# Patient Record
Sex: Male | Born: 1935
Health system: Southern US, Community
[De-identification: ages and names within clinical notes are randomized; demographics above are authoritative.]

## PROBLEM LIST (undated history)

## (undated) DIAGNOSIS — Z972 Presence of dental prosthetic device (complete) (partial): Secondary | ICD-10-CM

## (undated) DIAGNOSIS — I10 Essential (primary) hypertension: Secondary | ICD-10-CM

## (undated) DIAGNOSIS — R972 Elevated prostate specific antigen [PSA]: Secondary | ICD-10-CM

## (undated) DIAGNOSIS — E039 Hypothyroidism, unspecified: Secondary | ICD-10-CM

## (undated) DIAGNOSIS — K219 Gastro-esophageal reflux disease without esophagitis: Secondary | ICD-10-CM

## (undated) DIAGNOSIS — N529 Male erectile dysfunction, unspecified: Secondary | ICD-10-CM

## (undated) DIAGNOSIS — M109 Gout, unspecified: Secondary | ICD-10-CM

## (undated) DIAGNOSIS — F1021 Alcohol dependence, in remission: Secondary | ICD-10-CM

## (undated) DIAGNOSIS — M1990 Unspecified osteoarthritis, unspecified site: Secondary | ICD-10-CM

## (undated) DIAGNOSIS — N183 Chronic kidney disease, stage 3 unspecified: Secondary | ICD-10-CM

## (undated) DIAGNOSIS — Z862 Personal history of diseases of the blood and blood-forming organs and certain disorders involving the immune mechanism: Secondary | ICD-10-CM

## (undated) DIAGNOSIS — H409 Unspecified glaucoma: Secondary | ICD-10-CM

## (undated) DIAGNOSIS — N400 Enlarged prostate without lower urinary tract symptoms: Secondary | ICD-10-CM

## (undated) DIAGNOSIS — Z8619 Personal history of other infectious and parasitic diseases: Secondary | ICD-10-CM

## (undated) HISTORY — DX: Benign prostatic hyperplasia without lower urinary tract symptoms: N40.0

## (undated) HISTORY — DX: Male erectile dysfunction, unspecified: N52.9

## (undated) HISTORY — DX: Hypothyroidism, unspecified: E03.9

## (undated) HISTORY — PX: TONSILLECTOMY: SUR1361

## (undated) HISTORY — DX: Personal history of other infectious and parasitic diseases: Z86.19

## (undated) HISTORY — DX: Personal history of diseases of the blood and blood-forming organs and certain disorders involving the immune mechanism: Z86.2

## (undated) HISTORY — DX: Unspecified glaucoma: H40.9

## (undated) HISTORY — DX: Alcohol dependence, in remission: F10.21

## (undated) HISTORY — DX: Elevated prostate specific antigen (PSA): R97.20

## (undated) HISTORY — PX: EYE SURGERY: SHX253

## (undated) HISTORY — DX: Gout, unspecified: M10.9

## (undated) HISTORY — DX: Essential (primary) hypertension: I10

---

## 2002-06-01 HISTORY — PX: PARATHYROIDECTOMY: SHX19

## 2003-05-15 ENCOUNTER — Other Ambulatory Visit: Payer: Self-pay

## 2005-09-21 ENCOUNTER — Ambulatory Visit: Payer: Self-pay | Admitting: Gastroenterology

## 2005-11-09 ENCOUNTER — Encounter: Payer: Self-pay | Admitting: Family Medicine

## 2005-11-29 ENCOUNTER — Encounter: Payer: Self-pay | Admitting: Family Medicine

## 2009-03-12 ENCOUNTER — Ambulatory Visit: Payer: Self-pay | Admitting: Gastroenterology

## 2009-03-12 LAB — HM COLONOSCOPY

## 2012-05-17 DIAGNOSIS — L309 Dermatitis, unspecified: Secondary | ICD-10-CM | POA: Insufficient documentation

## 2012-08-02 DIAGNOSIS — B356 Tinea cruris: Secondary | ICD-10-CM | POA: Insufficient documentation

## 2012-10-25 ENCOUNTER — Ambulatory Visit: Payer: Self-pay | Admitting: Internal Medicine

## 2013-01-17 ENCOUNTER — Other Ambulatory Visit: Payer: Self-pay | Admitting: Rheumatology

## 2013-01-17 LAB — BODY FLUID CELL COUNT WITH DIFFERENTIAL
Eosinophil: 0 %
Neutrophils: 98 %
Nucleated Cell Count: 13881 /mm3
Other Cells BF: 0 %
Other Mononuclear Cells: 1 %

## 2013-01-17 LAB — SYNOVIAL FLUID, CRYSTAL

## 2013-03-15 DIAGNOSIS — Z79899 Other long term (current) drug therapy: Secondary | ICD-10-CM | POA: Insufficient documentation

## 2013-04-18 DIAGNOSIS — L308 Other specified dermatitis: Secondary | ICD-10-CM | POA: Insufficient documentation

## 2013-11-17 LAB — LIPID PANEL
Cholesterol: 178 mg/dL (ref 0–200)
HDL: 37 mg/dL (ref 35–70)
LDL CALC: 121 mg/dL
TRIGLYCERIDES: 98 mg/dL (ref 40–160)

## 2014-05-30 LAB — BASIC METABOLIC PANEL
BUN: 19 mg/dL (ref 4–21)
Creatinine: 1.7 mg/dL — AB (ref 0.6–1.3)
Glucose: 81 mg/dL
Potassium: 4.1 mmol/L (ref 3.4–5.3)
Sodium: 139 mmol/L (ref 137–147)

## 2014-05-30 LAB — TSH: TSH: 3.3 u[IU]/mL (ref 0.41–5.90)

## 2015-01-25 ENCOUNTER — Other Ambulatory Visit: Payer: Self-pay | Admitting: *Deleted

## 2015-01-25 NOTE — Telephone Encounter (Signed)
Received rx request for BD syringes from [place]. Called pt and left vm for return call, need more information for why pt needs syringes?

## 2015-02-07 NOTE — Telephone Encounter (Signed)
Patient is needing syringes for his b12 injections. Patient wants rx to go to [place].

## 2015-02-08 NOTE — Telephone Encounter (Signed)
Patient stated that he went to pharmacy yesterday and pick-up 6 syringes to use, from pharmacist. Patient said that when he comes to his next ov, he will give [place] the size he uses.

## 2015-02-08 NOTE — Telephone Encounter (Signed)
Need to know what size needles and syringes he has been using. We don't have a records of that information. If patient doesn't know then the pharmacy should. Thanks.

## 2015-03-08 ENCOUNTER — Ambulatory Visit (INDEPENDENT_AMBULATORY_CARE_PROVIDER_SITE_OTHER): Payer: Commercial Managed Care - HMO | Admitting: Family Medicine

## 2015-03-08 ENCOUNTER — Encounter: Payer: Self-pay | Admitting: Family Medicine

## 2015-03-08 VITALS — BP 130/80 | HR 76 | Temp 98.1°F | Resp 16 | Wt 188.0 lb

## 2015-03-08 DIAGNOSIS — E785 Hyperlipidemia, unspecified: Secondary | ICD-10-CM | POA: Insufficient documentation

## 2015-03-08 DIAGNOSIS — L299 Pruritus, unspecified: Secondary | ICD-10-CM | POA: Insufficient documentation

## 2015-03-08 DIAGNOSIS — N183 Chronic kidney disease, stage 3 unspecified: Secondary | ICD-10-CM | POA: Insufficient documentation

## 2015-03-08 DIAGNOSIS — M109 Gout, unspecified: Secondary | ICD-10-CM | POA: Insufficient documentation

## 2015-03-08 DIAGNOSIS — M8580 Other specified disorders of bone density and structure, unspecified site: Secondary | ICD-10-CM | POA: Insufficient documentation

## 2015-03-08 DIAGNOSIS — N400 Enlarged prostate without lower urinary tract symptoms: Secondary | ICD-10-CM | POA: Diagnosis not present

## 2015-03-08 DIAGNOSIS — H4010X0 Unspecified open-angle glaucoma, stage unspecified: Secondary | ICD-10-CM | POA: Insufficient documentation

## 2015-03-08 DIAGNOSIS — Z23 Encounter for immunization: Secondary | ICD-10-CM

## 2015-03-08 DIAGNOSIS — H401133 Primary open-angle glaucoma, bilateral, severe stage: Secondary | ICD-10-CM | POA: Insufficient documentation

## 2015-03-08 DIAGNOSIS — I10 Essential (primary) hypertension: Secondary | ICD-10-CM

## 2015-03-08 DIAGNOSIS — D510 Vitamin B12 deficiency anemia due to intrinsic factor deficiency: Secondary | ICD-10-CM | POA: Insufficient documentation

## 2015-03-08 DIAGNOSIS — F1011 Alcohol abuse, in remission: Secondary | ICD-10-CM | POA: Insufficient documentation

## 2015-03-08 DIAGNOSIS — E039 Hypothyroidism, unspecified: Secondary | ICD-10-CM | POA: Insufficient documentation

## 2015-03-08 DIAGNOSIS — J309 Allergic rhinitis, unspecified: Secondary | ICD-10-CM | POA: Insufficient documentation

## 2015-03-08 MED ORDER — DOXAZOSIN MESYLATE 2 MG PO TABS
2.0000 mg | ORAL_TABLET | Freq: Every day | ORAL | Status: DC
Start: 2015-03-08 — End: 2015-04-08

## 2015-03-08 NOTE — Progress Notes (Signed)
Patient: Jacob SWAMY Sr. Male    DOB: 1933-06-03   81 y.o.   MRN: [id number] Visit Date: 03/08/2015  Today's Provider: Lelon Huh, MD   Chief Complaint  Patient presents with  . Pruritis    intermittently x 3 years   Subjective:    HPI  Patient comes in today with a complaint of itching. Itching has occurred intermittently for the past 3 years. Itching is located on patients chest, lower back, legs and face. Itching is mostly on his lower back.   Patient states he started taking Tamsolosin and the itching worsened, so he stopped taking it and itching improved, but did not resolve. Patient also feels his amlodipine may be contributing to the itching. He states he has been seen by a Dermatologist in the past for itching and was prescribed Methotrexate 2.5mg  and Leucovorin 5mg . This helped resolve the Itch but patient states he could not stay on this medication because it was bad for his kidneys.      Allergies  Allergen Reactions  . Krystexxa  [Pegloticase] Anaphylaxis  . Allopurinol     Abdominal pain and itching  . Ciprofloxacin     causes mouth to itch  . Sulfa Antibiotics Itching  . Uloric  [Febuxostat]     Other reaction(s): Abdominal pain   Previous Medications   ALLOPURINOL (ZYLOPRIM) 100 MG TABLET    Take 1 tablet by mouth daily.   AMLODIPINE (NORVASC) 5 MG TABLET    Take 1 tablet by mouth daily.   AZELASTINE HCL (ASTEPRO) 0.15 % SOLN    Place 1 spray into both nostrils 2 (two) times daily.   CETIRIZINE (ZYRTEC) 10 MG TABLET    Take 1 tablet by mouth daily as needed.   COLCHICINE (COLCRYS) 0.6 MG TABLET    Take 1 tablet by mouth daily as needed. For gout   CYANOCOBALAMIN (,VITAMIN B-12,) 1000 MCG/ML INJECTION    Inject 1 mL into the muscle every 30 (thirty) days.   DORZOLAMIDE (TRUSOPT) 2 % OPHTHALMIC SOLUTION    Apply to eye.   DUTASTERIDE (AVODART) 0.5 MG CAPSULE    Take 1 capsule by mouth daily.   LATANOPROST (XALATAN) 0.005 % OPHTHALMIC SOLUTION    Apply 1  drop to eye daily.   LEVOTHYROXINE (SYNTHROID, LEVOTHROID) 112 MCG TABLET    Take 1 tablet by mouth daily.   MONTELUKAST (SINGULAIR) 10 MG TABLET    Take 1 tablet by mouth daily.   TAMSULOSIN (FLOMAX) 0.4 MG CAPS CAPSULE    Take 1 capsule by mouth daily.    Review of Systems  Constitutional: Negative for fever, chills and appetite change.  Respiratory: Negative for chest tightness, shortness of breath and wheezing.   Cardiovascular: Negative for chest pain and palpitations.  Gastrointestinal: Negative for nausea, vomiting and abdominal pain.  Skin: Negative for rash.       Itching    Social History  Substance Use Topics  . Smoking status: Former Smoker -- 1.50 packs/day for 20 years    Types: Cigarettes    Quit date: 06/01/1978  . Smokeless tobacco: Not on file  . Alcohol Use: No     Comment: former Alcoholic, meets with AA once a week. Has been alcohol free for 34 years   Objective:   BP 130/80 mmHg  Pulse 76  Temp(Src) 98.1 F (36.7 C) (Oral)  Resp 16  Wt 188 lb (85.276 kg)  Physical Exam  General appearance: alert, well developed, well  nourished, cooperative and in no distress Head: Normocephalic, without obvious abnormality, atraumatic Lungs: Respirations even and unlabored Extremities: No gross deformities Skin: Skin color, texture, turgor normal. No rashes seen  Psych: Appropriate mood and affect. Neurologic: Mental status: Alert, oriented to person, place, and time, thought content appropriate.     Assessment & Plan:     1. Itching No clear etiology, but suspect some component of drug reaction. He will discontinue tamsulocin and start doxazosin 2mg  daily.   2. Essential (primary) hypertension Continue amlodipine for now. Consider discontinuation if BP dropping on doxazodin  3. BPH (benign prostatic hyperplasia)   4. Need for influenza vaccination  - Flu vaccine HIGH DOSE PF (Fluzone High dose)     Follow up 3-4 weeks.   Lelon Huh, MD    [facility]

## 2015-03-19 ENCOUNTER — Encounter: Payer: Self-pay | Admitting: Family Medicine

## 2015-04-08 ENCOUNTER — Ambulatory Visit (INDEPENDENT_AMBULATORY_CARE_PROVIDER_SITE_OTHER): Payer: Commercial Managed Care - HMO | Admitting: Family Medicine

## 2015-04-08 ENCOUNTER — Encounter: Payer: Self-pay | Admitting: Family Medicine

## 2015-04-08 VITALS — BP 116/70 | HR 60 | Temp 98.0°F | Resp 16 | Ht 72.0 in | Wt 189.0 lb

## 2015-04-08 DIAGNOSIS — L299 Pruritus, unspecified: Secondary | ICD-10-CM | POA: Diagnosis not present

## 2015-04-08 DIAGNOSIS — I10 Essential (primary) hypertension: Secondary | ICD-10-CM

## 2015-04-08 DIAGNOSIS — M109 Gout, unspecified: Secondary | ICD-10-CM | POA: Diagnosis not present

## 2015-04-08 MED ORDER — COLCHICINE 0.6 MG PO TABS: 0.6000 mg | ORAL_TABLET | Freq: Every day | ORAL | Status: DC | PRN

## 2015-04-08 MED ORDER — DOXAZOSIN MESYLATE 2 MG PO TABS: 4.0000 mg | ORAL_TABLET | Freq: Every day | ORAL | Status: DC

## 2015-04-08 NOTE — Progress Notes (Signed)
Patient: Jacob RAGAIN Sr. Male    DOB: 30-Mar-1934   81 y.o.   MRN: [id number] Visit Date: 04/08/2015  Today's Provider: Lelon Huh, MD   Chief Complaint  Patient presents with  . Follow-up  . Hypertension  . Anal Itching   Subjective:    HPI  Follow-up for pruritus from 03/08/15 with chronic itching. He thought Flomax may contributing so we discontinued  tamsulosin and started doxazosin 2 mg qd. He is tolerating doxazosin well, but itching is no better. Systolic BP has been mostly around 110-120.     Hypertension, follow-up:  BP Readings from Last 3 Encounters:  04/08/15 116/70  03/08/15 130/80  09/11/14 144/86    He was last seen for hypertension 1 months ago.  BP at that visit was 130/80. Management since that visit includes; no changes. Continued amlodipine for now, will discontinue if bp dropping on doxazosin. He reports good compliance with treatment. He is not having side effects. none  He is exercising. He is adherent to low salt diet.   Outside blood pressures are 120/80. He is experiencing none.  Patient denies none.   Cardiovascular risk factors include none.  Use of agents associated with hypertension: none.     Weight trend: stable Wt Readings from Last 3 Encounters:  04/08/15 189 lb (85.73 kg)  03/08/15 188 lb (85.276 kg)  09/11/14 195 lb (88.451 kg)    Current diet: in general, a "healthy" diet    ---------------------------------------------------------------------- Gout: Only 2 episodes this year which quickly improve with colchicine, which he needs refilled.    Allergies  Allergen Reactions  . Krystexxa  [Pegloticase] Anaphylaxis  . Allopurinol     Abdominal pain and itching  . Ciprofloxacin     causes mouth to itch  . Sulfa Antibiotics Itching  . Uloric  [Febuxostat]     Other reaction(s): Abdominal pain   Previous Medications   ALLOPURINOL (ZYLOPRIM) 100 MG TABLET    Take 1 tablet by mouth daily.   AMLODIPINE (NORVASC) 5  MG TABLET    Take 1 tablet by mouth daily.   AZELASTINE HCL (ASTEPRO) 0.15 % SOLN    Place 1 spray into both nostrils 2 (two) times daily.   CETIRIZINE (ZYRTEC) 10 MG TABLET    Take 1 tablet by mouth daily as needed.   COLCHICINE (COLCRYS) 0.6 MG TABLET    Take 1 tablet by mouth daily as needed. For gout   CYANOCOBALAMIN (,VITAMIN B-12,) 1000 MCG/ML INJECTION    Inject 1 mL into the muscle every 30 (thirty) days.   DORZOLAMIDE (TRUSOPT) 2 % OPHTHALMIC SOLUTION    Apply to eye.   DOXAZOSIN (CARDURA) 2 MG TABLET    Take 1 tablet (2 mg total) by mouth daily.   DUTASTERIDE (AVODART) 0.5 MG CAPSULE    Take 1 capsule by mouth daily.   LATANOPROST (XALATAN) 0.005 % OPHTHALMIC SOLUTION    Apply 1 drop to eye daily.   LEVOTHYROXINE (SYNTHROID, LEVOTHROID) 112 MCG TABLET    Take 1 tablet by mouth daily.   MONTELUKAST (SINGULAIR) 10 MG TABLET    Take 1 tablet by mouth daily.    Review of Systems  Cardiovascular: Negative for chest pain and palpitations.  Neurological: Negative for dizziness and light-headedness.    Social History  Substance Use Topics  . Smoking status: Former Smoker -- 1.50 packs/day for 20 years    Types: Cigarettes    Quit date: 06/01/1978  . Smokeless tobacco: Not  on file  . Alcohol Use: No     Comment: former Alcoholic, meets with AA once a week. Has been alcohol free for 34 years   Objective:   BP 116/70 mmHg  Pulse 60  Temp(Src) 98 F (36.7 C) (Oral)  Resp 16  Ht 6' (1.829 m)  Wt 189 lb (85.73 kg)  BMI 25.63 kg/m2  SpO2 96%   General appearance: alert, well developed, well nourished, cooperative and in no distress Head: Normocephalic, without obvious abnormality, atraumatic Lungs: Respirations even and unlabored Extremities: No gross deformities Skin: Skin color, texture, turgor normal. No rashes seen  Psych: Appropriate mood and affect. Neurologic: Mental status: Alert, oriented to person, place, and time, thought content appropriate.    Functional Status  Survey: Is the patient deaf or have difficulty hearing?: No Does the patient have difficulty seeing, even when wearing glasses/contacts?: Yes Does the patient have difficulty concentrating, remembering, or making decisions?: No Does the patient have difficulty walking or climbing stairs?: No Does the patient have difficulty dressing or bathing?: No Does the patient have difficulty doing errands alone such as visiting a doctor's office or shopping?: No  Depression screen PHQ 2/9 04/08/2015  Decreased Interest 0  Down, Depressed, Hopeless 0  PHQ - 2 Score 0  Altered sleeping 0  Tired, decreased energy 0  Change in appetite 0  Feeling bad or failure about yourself  0  Trouble concentrating 1  Moving slowly or fidgety/restless 0  Suicidal thoughts 0  PHQ-9 Score 1  Difficult doing work/chores Not difficult at all    Fall Risk  04/08/2015  Falls in the past year? No     Physical Exam See above     Assessment & Plan:     1. Essential (primary) hypertension Well controlled. Will put amlodipine on hold to see if itching improves, and double dose of doxazosin. Will change to 4mg  tablet if 2x2mg  is working well.  - doxazosin (CARDURA) 2 MG tablet; Take 2 tablets (4 mg total) by mouth daily.  Dispense: 1 tablet; Refill: 0  2. Pruritus Unchanged. D/c amlodipine as above. Required methotrexate in the past  3. Gout, unspecified cause, unspecified chronicity, unspecified site Well controlled.  Needs refill for colcrys.  - colchicine (COLCRYS) 0.6 MG tablet; Take 1 tablet (0.6 mg total) by mouth daily as needed. For gout  Dispense: 30 tablet; Refill: 3   Return in about 3 months (around 07/09/2015).      Lelon Huh, MD  [facility]

## 2015-04-08 NOTE — Addendum Note (Signed)
Addended by: Julieta Bellini on: 04/08/2015 02:36 PM   Modules accepted: Miquel Dunn

## 2015-05-07 ENCOUNTER — Other Ambulatory Visit: Payer: Self-pay | Admitting: Family Medicine

## 2015-05-14 ENCOUNTER — Telehealth: Payer: Self-pay | Admitting: Family Medicine

## 2015-05-14 NOTE — Telephone Encounter (Signed)
Advised patient's wife. Scheduled appt for F/U.

## 2015-05-14 NOTE — Telephone Encounter (Signed)
Please advise patient we received letter from his dermatologist who suggested rash might be from amlodipine. I think it would fine to stop the amlodipine for the time being to see if it has any effects. Will need to schedule follow up o.v 2-3 weeks after stopping amlodipine to see how his blood pressure does.

## 2015-05-14 NOTE — Telephone Encounter (Signed)
Advised patient of medication changes.

## 2015-05-14 NOTE — Telephone Encounter (Signed)
Mrs Overholt is requesting a call back to discuss changing/stopping a medication.  CB#[phone]/MW

## 2015-05-28 ENCOUNTER — Ambulatory Visit (INDEPENDENT_AMBULATORY_CARE_PROVIDER_SITE_OTHER): Payer: Commercial Managed Care - HMO | Admitting: Family Medicine

## 2015-05-28 ENCOUNTER — Encounter: Payer: Self-pay | Admitting: Family Medicine

## 2015-05-28 VITALS — BP 148/90 | HR 60 | Temp 97.9°F | Resp 16 | Ht 72.0 in | Wt 196.0 lb

## 2015-05-28 DIAGNOSIS — L299 Pruritus, unspecified: Secondary | ICD-10-CM

## 2015-05-28 DIAGNOSIS — I10 Essential (primary) hypertension: Secondary | ICD-10-CM | POA: Diagnosis not present

## 2015-05-28 DIAGNOSIS — N400 Enlarged prostate without lower urinary tract symptoms: Secondary | ICD-10-CM | POA: Diagnosis not present

## 2015-05-28 MED ORDER — DOXAZOSIN MESYLATE 2 MG PO TABS: 6.0000 mg | ORAL_TABLET | Freq: Every day | ORAL | Status: DC

## 2015-05-28 NOTE — Progress Notes (Signed)
Patient: Jacob GRAYS Sr. Male    DOB: [birth year]   81 y.o.   MRN: [id number] Visit Date: 05/28/2015  Today's Provider: Lelon Huh, MD   Chief Complaint  Patient presents with  . Follow-up  . Hypertension   Subjective:    HPI   Hypertension, follow-up:  BP Readings from Last 3 Encounters:  05/28/15 128/80  04/08/15 116/70  03/08/15 130/80    He was last seen for hypertension 1 months ago.  BP at that visit was 116/70. Management since that visit includes discontinued amlodipine due to itching. Started doxazosin 2 mg x1 qd which he has titrated up to 2 tablets daily.  He reports good compliance with treatment. He is not having side effects. none  He is exercising. He is adherent to low salt diet.   Outside blood pressures are 148/85. He is experiencing none.  Patient denies none.   Cardiovascular risk factors include none.  Use of agents associated with hypertension: none.     Weight trend: stable Wt Readings from Last 3 Encounters:  05/28/15 196 lb (88.905 kg)  04/08/15 189 lb (85.73 kg)  03/08/15 188 lb (85.276 kg)    Current diet: in general, a "healthy" diet    ----------------------------------------------------------------------  Itching Since his last visit he has been seen by Dr. Reggy [facility] dermatology who though amlodipine may be responsible for rash, but was also started on methotrexate and he state itching has since resolved.     Allergies  Allergen Reactions  . Krystexxa  [Pegloticase] Anaphylaxis  . Allopurinol     Abdominal pain and itching  . Ciprofloxacin     causes mouth to itch  . Sulfa Antibiotics Itching  . Uloric  [Febuxostat]     Other reaction(s): Abdominal pain   Previous Medications   ALLOPURINOL (ZYLOPRIM) 100 MG TABLET    Take 1 tablet by mouth daily. Reported on 05/28/2015   AZELASTINE HCL (ASTEPRO) 0.15 % SOLN    Place 1 spray into both nostrils 2 (two) times daily. Reported on 05/28/2015   CETIRIZINE  (ZYRTEC) 10 MG TABLET    Take 1 tablet by mouth daily as needed. Reported on 05/28/2015   COLCHICINE (COLCRYS) 0.6 MG TABLET    Take 1 tablet (0.6 mg total) by mouth daily as needed. For gout   CYANOCOBALAMIN (,VITAMIN B-12,) 1000 MCG/ML INJECTION    Inject 1 mL into the muscle every 30 (thirty) days.   DORZOLAMIDE (TRUSOPT) 2 % OPHTHALMIC SOLUTION    Apply to eye.   DOXAZOSIN (CARDURA) 2 MG TABLET    TAKE 1 TABLET EVERY DAY   DUTASTERIDE (AVODART) 0.5 MG CAPSULE    Take 1 capsule by mouth daily. Reported on 123456   FOLIC ACID (FOLVITE) 1 MG TABLET    Take 1 mg by mouth daily. 1 tablet daily for 6 days weekly   LATANOPROST (XALATAN) 0.005 % OPHTHALMIC SOLUTION    Apply 1 drop to eye daily.   LEVOCETIRIZINE (XYZAL) 5 MG TABLET    Take 5 mg by mouth every evening.   LEVOTHYROXINE (SYNTHROID, LEVOTHROID) 112 MCG TABLET    Take 1 tablet by mouth daily.   METHOTREXATE (RHEUMATREX) 2.5 MG TABLET    Take 2.5 mg by mouth once a week. 3 tablets once a week Caution:Chemotherapy. Protect from light.   MONTELUKAST (SINGULAIR) 10 MG TABLET    Take 1 tablet by mouth daily.    Review of Systems  Constitutional: Negative for fever, chills  and appetite change.  Respiratory: Negative for chest tightness, shortness of breath and wheezing.   Cardiovascular: Negative for chest pain and palpitations.  Gastrointestinal: Negative for nausea, vomiting and abdominal pain.    Social History  Substance Use Topics  . Smoking status: Former Smoker -- 1.50 packs/day for 20 years    Types: Cigarettes    Quit date: 06/01/1978  . Smokeless tobacco: Not on file  . Alcohol Use: No     Comment: former Alcoholic, meets with AA once a week. Has been alcohol free for 34 years   Objective:     Filed Vitals:   05/28/15 1116 05/28/15 1208  BP: 128/80 148/90  Pulse: 60   Temp: 97.9 F (36.6 C)   TempSrc: Oral   Resp: 16   Height: 6' (1.829 m)   Weight: 196 lb (88.905 kg)   SpO2: 97%      Physical  Exam   General Appearance:    Alert, cooperative, no distress  Eyes:    PERRL, conjunctiva/corneas clear, EOM's intact       Lungs:     Clear to auscultation bilaterally, respirations unlabored  Heart:    Regular rate and rhythm  Neurologic:   Awake, alert, oriented x 3. No apparent focal neurological           defect.           Assessment & Plan:     1. BPH (benign prostatic hyperplasia) Well controlled on doxazocin.   2. Essential (primary) hypertension Tolerating 4mg  well will increase to 3 x 2mg  tablets and call if any adverse effects. - doxazosin (CARDURA) 2 MG tablet; Take 3 tablets (6 mg total) by mouth daily.  Dispense: 1 tablet  3. Pruritus Improved. Unclear if improvement is due to stopping amlodipine or restarting methotrexate. Will stay off of amlodipine and increase doxazocin has above.        Lelon Huh, MD  [facility]

## 2015-06-06 DIAGNOSIS — H401132 Primary open-angle glaucoma, bilateral, moderate stage: Secondary | ICD-10-CM | POA: Diagnosis not present

## 2015-06-12 DIAGNOSIS — H401133 Primary open-angle glaucoma, bilateral, severe stage: Secondary | ICD-10-CM | POA: Diagnosis not present

## 2015-06-17 ENCOUNTER — Telehealth: Payer: Self-pay | Admitting: Family Medicine

## 2015-06-17 DIAGNOSIS — I10 Essential (primary) hypertension: Secondary | ICD-10-CM

## 2015-06-17 MED ORDER — DOXAZOSIN MESYLATE 8 MG PO TABS: 8.0000 mg | ORAL_TABLET | Freq: Every day | ORAL | Status: DC

## 2015-06-17 NOTE — Telephone Encounter (Signed)
Please advise 

## 2015-06-17 NOTE — Telephone Encounter (Signed)
Pt calling needing a refill on the following doxazosin (CARDURA) 2 MG tablet, pt states he has enough for 2 days only ,would like to see if this could be called into [facility]. Also pt would like to know if its possible to up the MG to 8 MG Pt has been taking 6 MG, pt states his  BP is staying right around 145/80.  Thanks, CC

## 2015-06-17 NOTE — Telephone Encounter (Signed)
Have sent new rx for 8mg  doxazocin to [facility]

## 2015-07-02 ENCOUNTER — Encounter: Payer: Self-pay | Admitting: Family Medicine

## 2015-07-02 ENCOUNTER — Ambulatory Visit (INDEPENDENT_AMBULATORY_CARE_PROVIDER_SITE_OTHER): Payer: PPO | Admitting: Family Medicine

## 2015-07-02 VITALS — BP 150/80 | HR 61 | Temp 98.7°F | Resp 16 | Ht 72.0 in | Wt 195.0 lb

## 2015-07-02 DIAGNOSIS — I10 Essential (primary) hypertension: Secondary | ICD-10-CM | POA: Diagnosis not present

## 2015-07-02 DIAGNOSIS — E785 Hyperlipidemia, unspecified: Secondary | ICD-10-CM

## 2015-07-02 DIAGNOSIS — N183 Chronic kidney disease, stage 3 unspecified: Secondary | ICD-10-CM

## 2015-07-02 DIAGNOSIS — E039 Hypothyroidism, unspecified: Secondary | ICD-10-CM | POA: Diagnosis not present

## 2015-07-02 DIAGNOSIS — D510 Vitamin B12 deficiency anemia due to intrinsic factor deficiency: Secondary | ICD-10-CM

## 2015-07-02 DIAGNOSIS — Z125 Encounter for screening for malignant neoplasm of prostate: Secondary | ICD-10-CM

## 2015-07-02 NOTE — Progress Notes (Signed)
Patient: Jacob DUDZIAK Sr. Male    DOB: 05-17-1934   82 y.o.   MRN: [id number] Visit Date: 07/02/2015  Today's Provider: Lelon Huh, MD   Chief Complaint  Patient presents with  . Blood Pressure Check  . Hypertension   Subjective:    HPI     Hypertension, follow-up:  BP Readings from Last 3 Encounters:  07/02/15 150/80  05/28/15 148/90  04/08/15 116/70    He was last seen for hypertension 1 months ago.  BP at that visit was 128/80. Management since that visit includes; increased doxazosin to 6 mg total daily. Changed since last visit on 06/07/2015, increased doxazosin to 8 mg qd due to elevated blood pressure.He reports good compliance with treatment. He is having side effects. lightheadedness  He is exercising. He is adherent to low salt diet.   Outside blood pressures are labile, SBP sometimes in  The 130s, sometimes near 180.  Has had no gout and no itching since stopping amlodipine but he has also been on methotrexate for the last couple of months.  He is experiencing lightheadedness.  Patient denies none.   Cardiovascular risk factors include none.  Use of agents associated with hypertension: none.    ----------------------------------------------------------------------    Allergies  Allergen Reactions  . Krystexxa  [Pegloticase] Anaphylaxis  . Allopurinol     Abdominal pain and itching  . Ciprofloxacin     causes mouth to itch  . Sulfa Antibiotics Itching  . Uloric  [Febuxostat]     Other reaction(s): Abdominal pain   Previous Medications   ALLOPURINOL (ZYLOPRIM) 100 MG TABLET    Take 1 tablet by mouth daily. Reported on 05/28/2015   ASPIRIN 81 MG TABLET    Take 81 mg by mouth daily.   AZELASTINE HCL (ASTEPRO) 0.15 % SOLN    Place 1 spray into both nostrils 2 (two) times daily. Reported on 05/28/2015   CETIRIZINE (ZYRTEC) 10 MG TABLET    Take 1 tablet by mouth daily as needed. Reported on 05/28/2015   COLCHICINE (COLCRYS) 0.6 MG TABLET    Take  1 tablet (0.6 mg total) by mouth daily as needed. For gout   CYANOCOBALAMIN (,VITAMIN B-12,) 1000 MCG/ML INJECTION    Inject 1 mL into the muscle every 30 (thirty) days.   DORZOLAMIDE (TRUSOPT) 2 % OPHTHALMIC SOLUTION    Apply to eye.   DOXAZOSIN (CARDURA) 8 MG TABLET    Take 1 tablet (8 mg total) by mouth daily.   DUTASTERIDE (AVODART) 0.5 MG CAPSULE    Take 1 capsule by mouth daily. Reported on 123456   FOLIC ACID (FOLVITE) 1 MG TABLET    Take 1 mg by mouth daily. 1 tablet daily for 6 days weekly   LATANOPROST (XALATAN) 0.005 % OPHTHALMIC SOLUTION    Apply 1 drop to eye daily.   LEVOCETIRIZINE (XYZAL) 5 MG TABLET    Take 5 mg by mouth every evening.   LEVOTHYROXINE (SYNTHROID, LEVOTHROID) 112 MCG TABLET    Take 1 tablet by mouth daily.   METHOTREXATE (RHEUMATREX) 2.5 MG TABLET    Take 2.5 mg by mouth once a week. 3 tablets once a week Caution:Chemotherapy. Protect from light.   MONTELUKAST (SINGULAIR) 10 MG TABLET    Take 1 tablet by mouth daily.    Review of Systems  Constitutional: Negative for fever, chills and appetite change.  Respiratory: Negative for chest tightness, shortness of breath and wheezing.   Cardiovascular: Negative for chest pain  and palpitations.  Gastrointestinal: Negative for nausea, vomiting and abdominal pain.  Neurological: Positive for light-headedness.    Social History  Substance Use Topics  . Smoking status: Former Smoker -- 1.50 packs/day for 20 years    Types: Cigarettes    Quit date: 06/01/1978  . Smokeless tobacco: Not on file  . Alcohol Use: No     Comment: former Alcoholic, meets with AA once a week. Has been alcohol free for 34 years   Objective:   BP 150/80 mmHg  Pulse 61  Temp(Src) 98.7 F (37.1 C) (Oral)  Resp 16  Ht 6' (1.829 m)  Wt 195 lb (88.451 kg)  BMI 26.44 kg/m2  SpO2 96%  Physical Exam   General Appearance:    Alert, cooperative, no distress  Eyes:    PERRL, conjunctiva/corneas clear, EOM's intact       Lungs:      Clear to auscultation bilaterally, respirations unlabored  Heart:    Regular rate and rhythm  Neurologic:   Awake, alert, oriented x 3. No apparent focal neurological           defect.           Assessment & Plan:     1. Chronic kidney disease (CKD), stage III (moderate)  - Comprehensive metabolic panel - Uric acid  2. Essential (primary) hypertension If renal panel is normal will start valsartan.   3. Hyperlipidemia  - Lipid panel  4. Hypothyroidism, unspecified hypothyroidism type Needs refill levothyroxine sent to [place] if TSH is normal.  - TSH  5. Pernicious anemia On B12 injections. [facility] if levels are normal.  - Vitamin B12 - CBC  6. Prostate cancer screening  - PSA       Lelon Huh, MD  [facility]

## 2015-07-03 DIAGNOSIS — D510 Vitamin B12 deficiency anemia due to intrinsic factor deficiency: Secondary | ICD-10-CM | POA: Diagnosis not present

## 2015-07-03 DIAGNOSIS — E785 Hyperlipidemia, unspecified: Secondary | ICD-10-CM | POA: Diagnosis not present

## 2015-07-03 DIAGNOSIS — N183 Chronic kidney disease, stage 3 (moderate): Secondary | ICD-10-CM | POA: Diagnosis not present

## 2015-07-03 DIAGNOSIS — E039 Hypothyroidism, unspecified: Secondary | ICD-10-CM | POA: Diagnosis not present

## 2015-07-03 DIAGNOSIS — Z125 Encounter for screening for malignant neoplasm of prostate: Secondary | ICD-10-CM | POA: Diagnosis not present

## 2015-07-04 ENCOUNTER — Other Ambulatory Visit: Payer: Self-pay | Admitting: Family Medicine

## 2015-07-04 LAB — CBC
HEMATOCRIT: 39 % (ref 37.5–51.0)
Hemoglobin: 12.8 g/dL (ref 12.6–17.7)
MCH: 26.3 pg — ABNORMAL LOW (ref 26.6–33.0)
MCHC: 32.8 g/dL (ref 31.5–35.7)
MCV: 80 fL (ref 79–97)
Platelets: 217 10*3/uL (ref 150–379)
RBC: 4.87 x10E6/uL (ref 4.14–5.80)
RDW: 16.6 % — AB (ref 12.3–15.4)
WBC: 6.4 10*3/uL (ref 3.4–10.8)

## 2015-07-04 LAB — COMPREHENSIVE METABOLIC PANEL
A/G RATIO: 1.7 (ref 1.1–2.5)
ALBUMIN: 4.4 g/dL (ref 3.5–4.8)
ALK PHOS: 85 IU/L (ref 39–117)
ALT: 11 IU/L (ref 0–44)
AST: 18 IU/L (ref 0–40)
BUN / CREAT RATIO: 10 (ref 10–22)
BUN: 16 mg/dL (ref 8–27)
Bilirubin Total: 0.6 mg/dL (ref 0.0–1.2)
CALCIUM: 9.3 mg/dL (ref 8.6–10.2)
CO2: 24 mmol/L (ref 18–29)
Chloride: 102 mmol/L (ref 96–106)
Creatinine, Ser: 1.62 mg/dL — ABNORMAL HIGH (ref 0.76–1.27)
GFR calc Af Amer: 46 mL/min/{1.73_m2} — ABNORMAL LOW (ref >59)
GFR, EST NON AFRICAN AMERICAN: 40 mL/min/{1.73_m2} — AB (ref >59)
GLOBULIN, TOTAL: 2.6 g/dL (ref 1.5–4.5)
Glucose: 98 mg/dL (ref 65–99)
POTASSIUM: 4 mmol/L (ref 3.5–5.2)
SODIUM: 142 mmol/L (ref 134–144)
Total Protein: 7 g/dL (ref 6.0–8.5)

## 2015-07-04 LAB — URIC ACID: Uric Acid: 8.4 mg/dL (ref 3.7–8.6)

## 2015-07-04 LAB — LIPID PANEL
CHOL/HDL RATIO: 4.6 ratio (ref 0.0–5.0)
CHOLESTEROL TOTAL: 182 mg/dL (ref 100–199)
HDL: 40 mg/dL (ref >39)
LDL CALC: 127 mg/dL — AB (ref 0–99)
Triglycerides: 73 mg/dL (ref 0–149)
VLDL Cholesterol Cal: 15 mg/dL (ref 5–40)

## 2015-07-04 LAB — PSA: PROSTATE SPECIFIC AG, SERUM: 10.2 ng/mL — AB (ref 0.0–4.0)

## 2015-07-04 LAB — TSH: TSH: 5.18 u[IU]/mL — AB (ref 0.450–4.500)

## 2015-07-04 LAB — VITAMIN B12: VITAMIN B 12: 475 pg/mL (ref 211–946)

## 2015-07-04 MED ORDER — VALSARTAN 80 MG PO TABS: 80.0000 mg | ORAL_TABLET | Freq: Every day | ORAL | Status: DC

## 2015-07-04 MED ORDER — CYANOCOBALAMIN 1000 MCG/ML IJ SOLN: 1000.0000 ug | INTRAMUSCULAR | Status: DC

## 2015-07-04 MED ORDER — LEVOTHYROXINE SODIUM 125 MCG PO TABS: 125.0000 ug | ORAL_TABLET | Freq: Every day | ORAL | Status: DC

## 2015-07-05 ENCOUNTER — Telehealth: Payer: Self-pay | Admitting: *Deleted

## 2015-07-05 DIAGNOSIS — R972 Elevated prostate specific antigen [PSA]: Secondary | ICD-10-CM

## 2015-07-05 NOTE — Telephone Encounter (Signed)
-----   Message from Birdie Sons, MD sent at 07/04/2015  8:06 AM EST ----- Is a little hyPOthyroid. Have increase dose of levothyroxine to 117mcg [facility] Need to recheck thyroid functions in 3 months. PSA is very elevated at 10.2, need referral to urology for follow up.

## 2015-07-05 NOTE — Telephone Encounter (Signed)
Please schedule appointment to urology. Thanks

## 2015-07-08 ENCOUNTER — Encounter: Payer: Self-pay | Admitting: *Deleted

## 2015-07-08 DIAGNOSIS — L820 Inflamed seborrheic keratosis: Secondary | ICD-10-CM | POA: Diagnosis not present

## 2015-07-08 DIAGNOSIS — Z5181 Encounter for therapeutic drug level monitoring: Secondary | ICD-10-CM | POA: Diagnosis not present

## 2015-07-08 DIAGNOSIS — L308 Other specified dermatitis: Secondary | ICD-10-CM | POA: Diagnosis not present

## 2015-07-09 ENCOUNTER — Ambulatory Visit: Payer: Commercial Managed Care - HMO | Admitting: Family Medicine

## 2015-07-11 ENCOUNTER — Ambulatory Visit (INDEPENDENT_AMBULATORY_CARE_PROVIDER_SITE_OTHER): Payer: PPO | Admitting: Urology

## 2015-07-11 ENCOUNTER — Encounter: Payer: Self-pay | Admitting: Urology

## 2015-07-11 VITALS — BP 155/85 | HR 67 | Ht 72.0 in | Wt 195.3 lb

## 2015-07-11 DIAGNOSIS — R972 Elevated prostate specific antigen [PSA]: Secondary | ICD-10-CM

## 2015-07-11 DIAGNOSIS — N138 Other obstructive and reflux uropathy: Secondary | ICD-10-CM

## 2015-07-11 DIAGNOSIS — N401 Enlarged prostate with lower urinary tract symptoms: Secondary | ICD-10-CM

## 2015-07-11 MED ORDER — FINASTERIDE 5 MG PO TABS: 5.0000 mg | ORAL_TABLET | Freq: Every day | ORAL | Status: DC

## 2015-07-11 NOTE — Progress Notes (Signed)
07/11/2015 9:28 PM   Jacob Darlyn Read Sr. 1933-09-05 [id number]  Referring provider: Birdie Sons, MD [address] [place], [place] [address]  Chief Complaint  Patient presents with  . Elevated PSA    old patient 2015 from allscripts    HPI: Patient is a 82 year old Caucasian male with a history of elevated PSA and BPH with LUTS who is referred to [place] by Dr. Caryn Section for a PSA of 10.2.  Elevated PSA Patient underwent a biopsy in December 2012 for a PSA of 6.0 while on finasteride. Biopsy returned negative.  Patient remained on finasteride and his PSA levels have remained under 6.  He had not been on his finasteride for the last 2 years and his PSA has returned at 10.2 ng/mL on 07/03/2015.  BPH WITH LUTS His IPSS score today is 14, which is moderate lower urinary tract symptomatology. He is mostly satisfied with his quality life due to his urinary symptoms. He has had these symptoms for several years.  He denies any dysuria, hematuria or suprapubic pain.  He also denies any recent fevers, chills, nausea or vomiting.  He does not have a family history of PCa.      IPSS      07/11/15 1500       International Prostate Symptom Score   How often have you had the sensation of not emptying your bladder? Less than half the time     How often have you had to urinate less than every two hours? Less than half the time     How often have you found you stopped and started again several times when you urinated? Less than 1 in 5 times     How often have you found it difficult to postpone urination? Less than half the time     How often have you had a weak urinary stream? Less than half the time     How often have you had to strain to start urination? About half the time     How many times did you typically get up at night to urinate? 2 Times     Total IPSS Score 14     Quality of Life due to urinary symptoms   If you were to spend the rest of your life with your urinary condition  just the way it is now how would you feel about that? Mostly Satisfied        Score:  1-7 Mild 8-19 Moderate 20-35 Severe   PMH: Past Medical History  Diagnosis Date  . Gout   . History of chicken pox   . History of measles   . History of mumps   . History of pernicious anemia   . HTN (hypertension)   . Hypothyroidism   . Glaucoma   . History of alcoholism ([place])   . ED (erectile dysfunction)   . BPH (benign prostatic hyperplasia)   . Elevated PSA     Surgical History: Past Surgical History  Procedure Laterality Date  . Parathyroidectomy  2004  . Tonsillectomy      Home Medications:    Medication List       This list is accurate as of: 07/11/15 11:59 PM.  Always use your most recent med list.               allopurinol 100 MG tablet  Commonly known as:  ZYLOPRIM  Take 1 tablet by mouth daily. Reported on 07/11/2015  amLODipine 5 MG tablet  Commonly known as:  NORVASC  Take by mouth. Reported on 07/11/2015     aspirin 81 MG tablet  Take 81 mg by mouth daily.     ASTEPRO 0.15 % Soln  Generic drug:  Azelastine HCl  Place 1 spray into both nostrils 2 (two) times daily. Reported on 07/11/2015     brimonidine 0.1 % Soln  Commonly known as:  ALPHAGAN P  Apply to eye. Reported on 07/11/2015     cetirizine 10 MG tablet  Commonly known as:  ZYRTEC  Take 1 tablet by mouth daily as needed. Reported on 07/11/2015     colchicine 0.6 MG tablet  Commonly known as:  COLCRYS  Take 1 tablet (0.6 mg total) by mouth daily as needed. For gout     cyanocobalamin 1000 MCG/ML injection  Commonly known as:  (VITAMIN B-12)  Inject 1 mL (1,000 mcg total) into the muscle every 30 (thirty) days.     dorzolamide 2 % ophthalmic solution  Commonly known as:  TRUSOPT  Apply to eye.     dorzolamide-timolol 22.3-6.8 MG/ML ophthalmic solution  Commonly known as:  COSOPT  Apply to eye.     doxazosin 8 MG tablet  Commonly known as:  CARDURA  Take 1 tablet (8 mg total) by mouth  daily.     finasteride 5 MG tablet  Commonly known as:  PROSCAR  Take 1 tablet (5 mg total) by mouth daily. Reported on Q000111Q     folic acid 1 MG tablet  Commonly known as:  FOLVITE  Take 1 mg by mouth daily. 1 tablet daily for 6 days weekly     latanoprost 0.005 % ophthalmic solution  Commonly known as:  XALATAN  Apply 1 drop to eye daily.     levocetirizine 5 MG tablet  Commonly known as:  XYZAL  Take 5 mg by mouth every evening. Reported on 07/11/2015     levothyroxine 125 MCG tablet  Commonly known as:  SYNTHROID, LEVOTHROID  Take 1 tablet (125 mcg total) by mouth daily.     methotrexate 2.5 MG tablet  Commonly known as:  RHEUMATREX  Take 2.5 mg by mouth once a week. 3 tablets once a week Caution:Chemotherapy. Protect from light.     montelukast 10 MG tablet  Commonly known as:  SINGULAIR  Take 1 tablet by mouth daily.     sildenafil 50 MG tablet  Commonly known as:  VIAGRA  Take by mouth. Reported on 07/11/2015     timolol 0.25 % ophthalmic solution  Commonly known as:  BETIMOL  Apply to eye.     valsartan 80 MG tablet  Commonly known as:  DIOVAN  Take 1 tablet (80 mg total) by mouth daily.        Allergies:  Allergies  Allergen Reactions  . Krystexxa  [Pegloticase] Anaphylaxis  . Allopurinol     Abdominal pain and itching  . Ciprofloxacin     causes mouth to itch  . Levofloxacin Other (See Comments)    Tendon pain  . Niacin     Other reaction(s): UNKNOWN  . Sulfa Antibiotics Itching  . Uloric  [Febuxostat]     Other reaction(s): Abdominal pain    Family History: Family History  Problem Relation Age of Onset  . Asthma Mother   . Heart attack Father   . Parkinson's disease Father   . Kidney disease Neg Hx   . Prostate cancer Neg Hx  maybe paternal grandfather ? unsure    Social History:  reports that he quit smoking about 37 years ago. His smoking use included Cigarettes. He has a 30 pack-year smoking history. He does not have any  smokeless tobacco history on file. He reports that he does not drink alcohol or use illicit drugs.  ROS: UROLOGY Frequent Urination?: No Hard to postpone urination?: Yes Burning/pain with urination?: No Get up at night to urinate?: Yes Leakage of urine?: No Urine stream starts and stops?: No Trouble starting stream?: No Do you have to strain to urinate?: No Blood in urine?: No Urinary tract infection?: No Sexually transmitted disease?: No Injury to kidneys or bladder?: No Painful intercourse?: No Weak stream?: No Erection problems?: Yes Penile pain?: No  Gastrointestinal Nausea?: No Vomiting?: No Indigestion/heartburn?: No Diarrhea?: No Constipation?: No  Constitutional Fever: No Night sweats?: No Weight loss?: No Fatigue?: No  Skin Skin rash/lesions?: No Itching?: Yes  Eyes Blurred vision?: No Double vision?: No  Ears/Nose/Throat Sore throat?: No Sinus problems?: No  Hematologic/Lymphatic Swollen glands?: No Easy bruising?: No  Cardiovascular Leg swelling?: No Chest pain?: No  Respiratory Cough?: No Shortness of breath?: No  Endocrine Excessive thirst?: No  Musculoskeletal Back pain?: No Joint pain?: No  Neurological Headaches?: No Dizziness?: No  Psychologic Depression?: No Anxiety?: No  Physical Exam: BP 155/85 mmHg  Pulse 67  Ht 6' (1.829 m)  Wt 195 lb 4.8 oz (88.587 kg)  BMI 26.48 kg/m2  Constitutional: Well nourished. Alert and oriented, No acute distress. HEENT: [place] AT, moist mucus membranes. Trachea midline, no masses. Cardiovascular: No clubbing, cyanosis, or edema. Respiratory: Normal respiratory effort, no increased work of breathing. GI: Abdomen is soft, non tender, non distended, no abdominal masses. Liver and spleen not palpable.  No hernias appreciated.  Stool sample for occult testing is not indicated.   GU: No CVA tenderness.  No bladder fullness or masses.  Patient with circumcised phallus.   Urethral meatus is  patent.  No penile discharge. No penile lesions or rashes. Scrotum without lesions, cysts, rashes and/or edema.  Testicles are located scrotally bilaterally. No masses are appreciated in the testicles. Left and right epididymis are normal. Rectal: Patient with  normal sphincter tone. Anus and perineum without scarring or rashes. No rectal masses are appreciated. Prostate is approximately 55 grams, no nodules are appreciated. Seminal vesicles are normal. Skin: No rashes, bruises or suspicious lesions. Lymph: No cervical or inguinal adenopathy. Neurologic: Grossly intact, no focal deficits, moving all 4 extremities. Psychiatric: Normal mood and affect.  Laboratory Data: PSA History  6.0 ng/mL on 05/12/2011-biopsy negative-on finasteride  3.6 ng/mL on 10/13/2011-on finasteride  5.4 ng/mL on 04/13/2012-on finasteride  3.2 ng/mL on 10/11/2012-on finasteride  3.6 ng/mL on 04/13/2013-on finasteride           10.2 ng/mL on 07/03/2015-not on finasteride    Lab Results  Component Value Date   WBC 6.4 07/03/2015   HCT 39.0 07/03/2015   MCV 80 07/03/2015   PLT 217 07/03/2015   Lab Results  Component Value Date   CREATININE 1.62* 07/03/2015   Lab Results  Component Value Date   TSH 5.180* 07/03/2015      Component Value Date/Time   CHOL 182 07/03/2015 0813   CHOL 178 11/17/2013   HDL 40 07/03/2015 0813   HDL 37 11/17/2013   CHOLHDL 4.6 07/03/2015 0813   LDLCALC 127* 07/03/2015 0813   LDLCALC 121 11/17/2013   Lab Results  Component Value Date   AST 18  07/03/2015   Lab Results  Component Value Date   ALT 11 07/03/2015    Assessment & Plan:    1. Elevated PSA:   Patient's recent PSA is returned at 10.2.  I have repeating the PSA today.  If it has found to be significantly decreased, he will return in 6 months for a follow-up exam and PSA.  If it returns at 10 or higher, he will pursue a prostate biopsy.  - PSA  2. BPH with LUTS:   IPSS score is 14/2.  Patient is started on  finasteride at today's exam. He will return for a follow-up I PSS score, exam and PSA pending lab results.   Return for pending labs.  These notes generated with voice recognition software. I apologize for typographical errors.  Zara Council, [facility] [address], [place], [place] [address] [phone]

## 2015-07-12 ENCOUNTER — Telehealth: Payer: Self-pay

## 2015-07-12 DIAGNOSIS — R972 Elevated prostate specific antigen [PSA]: Secondary | ICD-10-CM

## 2015-07-12 LAB — PSA: Prostate Specific Ag, Serum: 9.4 ng/mL — ABNORMAL HIGH (ref 0.0–4.0)

## 2015-07-12 NOTE — Telephone Encounter (Signed)
Spoke with pt in reference lab results. Pt stated Larene [place] called earlier and spoke with him about it. Lab appt was made and orders placed.

## 2015-07-12 NOTE — Telephone Encounter (Signed)
-----   Message from Nori Riis, PA-C sent at 07/12/2015  8:51 AM EST ----- Patient's PSA has lowered somewhat.  He will come in one month for a repeated PSA.

## 2015-07-13 DIAGNOSIS — R972 Elevated prostate specific antigen [PSA]: Secondary | ICD-10-CM | POA: Insufficient documentation

## 2015-08-08 ENCOUNTER — Other Ambulatory Visit: Payer: Self-pay

## 2015-08-08 DIAGNOSIS — R972 Elevated prostate specific antigen [PSA]: Secondary | ICD-10-CM

## 2015-08-09 ENCOUNTER — Other Ambulatory Visit: Payer: PPO

## 2015-08-09 DIAGNOSIS — R972 Elevated prostate specific antigen [PSA]: Secondary | ICD-10-CM

## 2015-08-10 LAB — PSA: PROSTATE SPECIFIC AG, SERUM: 6.4 ng/mL — AB (ref 0.0–4.0)

## 2015-08-12 ENCOUNTER — Telehealth: Payer: Self-pay

## 2015-08-12 DIAGNOSIS — R972 Elevated prostate specific antigen [PSA]: Secondary | ICD-10-CM

## 2015-08-12 NOTE — Telephone Encounter (Signed)
-----   Message from Nori Riis, PA-C sent at 08/11/2015  9:08 PM EDT ----- Patient's PSA continues to decrease.  I like to see him in July for a PSA and an office visit.

## 2015-08-12 NOTE — Telephone Encounter (Signed)
Spoke wit pt in reference to PSA. Made aware for the need of an office visit and another PSA in July. Pt voiced understanding. Pt was transferred to the front to make f/u appt.

## 2015-08-16 ENCOUNTER — Other Ambulatory Visit: Payer: Self-pay | Admitting: Family Medicine

## 2015-08-26 ENCOUNTER — Ambulatory Visit: Payer: Commercial Managed Care - HMO | Admitting: Family Medicine

## 2015-08-27 ENCOUNTER — Ambulatory Visit: Payer: PPO | Admitting: Family Medicine

## 2015-09-11 DIAGNOSIS — H401133 Primary open-angle glaucoma, bilateral, severe stage: Secondary | ICD-10-CM | POA: Diagnosis not present

## 2015-10-01 ENCOUNTER — Encounter: Payer: Self-pay | Admitting: Family Medicine

## 2015-10-01 ENCOUNTER — Ambulatory Visit (INDEPENDENT_AMBULATORY_CARE_PROVIDER_SITE_OTHER): Payer: PPO | Admitting: Family Medicine

## 2015-10-01 VITALS — BP 130/70 | HR 62 | Temp 97.8°F | Resp 16 | Wt 195.0 lb

## 2015-10-01 DIAGNOSIS — E039 Hypothyroidism, unspecified: Secondary | ICD-10-CM | POA: Diagnosis not present

## 2015-10-01 DIAGNOSIS — B354 Tinea corporis: Secondary | ICD-10-CM

## 2015-10-01 DIAGNOSIS — N183 Chronic kidney disease, stage 3 unspecified: Secondary | ICD-10-CM

## 2015-10-01 DIAGNOSIS — M109 Gout, unspecified: Secondary | ICD-10-CM | POA: Diagnosis not present

## 2015-10-01 DIAGNOSIS — I10 Essential (primary) hypertension: Secondary | ICD-10-CM | POA: Diagnosis not present

## 2015-10-01 MED ORDER — CLOTRIMAZOLE-BETAMETHASONE 1-0.05 % EX CREA: 1.0000 "application " | TOPICAL_CREAM | Freq: Two times a day (BID) | CUTANEOUS | Status: DC

## 2015-10-01 NOTE — Progress Notes (Signed)
Patient: Jacob CARAZO Sr. Male    DOB: [birth year]   82 y.o.   MRN: [id number] Visit Date: 10/01/2015  Today's Provider: Lelon Huh, MD   Chief Complaint  Patient presents with  . Hypertension    follow up  . Hypothyroidism    follow up  . Chronic Kidney Disease    follow up   Subjective:    HPI  Hypertension, follow-up:  BP Readings from Last 3 Encounters:  07/11/15 155/85  07/02/15 150/80  05/28/15 148/90    He was last seen for hypertension 3 months ago.  BP at that visit was 150/80. Management since that visit includes starting valsartan which he is tolerating well.   He reports good compliance with treatment. He is not having side effects.  He is exercising. He is adherent to low salt diet.   Outside blood pressures are not being checked. He is experiencing none.  Patient denies chest pain, chest pressure/discomfort, claudication, dyspnea, exertional chest pressure/discomfort, fatigue, irregular heart beat, lower extremity edema, near-syncope, orthopnea, palpitations, paroxysmal nocturnal dyspnea, syncope and tachypnea.   Cardiovascular risk factors include advanced age (older than 76 for men, 57 for women), hypertension and male gender.  Use of agents associated with hypertension: thyroid hormones.     Weight trend: stable Wt Readings from Last 3 Encounters:  07/11/15 195 lb 4.8 oz (88.587 kg)  07/02/15 195 lb (88.451 kg)  05/28/15 196 lb (88.905 kg)    Current diet: well balanced  ------------------------------------------------------------------------  Follow up Hypothyroidism:  Last office visit was 3 months ago. Changes made since that visit includes increasing Levothyroxine to 172mcg daily due to low TSH of 5.180. Patient reports good compliance with treatment and good tolerance. Denies chest pain, insomnia, heart flutters, change in energy level, change in bowel movements, or tremor.    Follow up CKD: Last office visit was 3 months ago and  no changes were made. Patient reports good compliance with treatment.  Follow up gout.  He states he has stopped allopurinol and has had no gout attacks.since.     Allergies  Allergen Reactions  . Krystexxa  [Pegloticase] Anaphylaxis  . Allopurinol     Abdominal pain and itching  . Ciprofloxacin     causes mouth to itch  . Levofloxacin Other (See Comments)    Tendon pain  . Niacin     Other reaction(s): UNKNOWN  . Sulfa Antibiotics Itching  . Uloric  [Febuxostat]     Other reaction(s): Abdominal pain   Previous Medications   ALLOPURINOL (ZYLOPRIM) 100 MG TABLET    Take 1 tablet by mouth daily. Reported on 09/25/2015   AMLODIPINE (NORVASC) 5 MG TABLET    Take by mouth. Reported on 09/25/2015   ASPIRIN 81 MG TABLET    Take 81 mg by mouth daily. Reported on 09/25/2015   AZELASTINE HCL (ASTEPRO) 0.15 % SOLN    Place 1 spray into both nostrils 2 (two) times daily. Reported on 09/25/2015   BRIMONIDINE (ALPHAGAN P) 0.1 % SOLN    Apply to eye. Reported on 07/11/2015   CETIRIZINE (ZYRTEC) 10 MG TABLET    Take 1 tablet by mouth daily as needed. Reported on 09/25/2015   COLCHICINE (COLCRYS) 0.6 MG TABLET    Take 1 tablet (0.6 mg total) by mouth daily as needed. For gout   CYANOCOBALAMIN (,VITAMIN B-12,) 1000 MCG/ML INJECTION    Inject 1 mL (1,000 mcg total) into the muscle every 30 (thirty) days.  DORZOLAMIDE (TRUSOPT) 2 % OPHTHALMIC SOLUTION    Apply to eye.   DORZOLAMIDE-TIMOLOL (COSOPT) 22.3-6.8 MG/ML OPHTHALMIC SOLUTION    Apply to eye.   DOXAZOSIN (CARDURA) 8 MG TABLET    TAKE 1 TABLET (8 MG TOTAL) BY MOUTH DAILY.   FINASTERIDE (PROSCAR) 5 MG TABLET    Take 1 tablet (5 mg total) by mouth daily. Reported on Q000111Q   FOLIC ACID (FOLVITE) 1 MG TABLET    Take 1 mg by mouth daily. 1 tablet daily for 6 days weekly/pm   LATANOPROST (XALATAN) 0.005 % OPHTHALMIC SOLUTION    Apply 1 drop to eye daily.   LEVOCETIRIZINE (XYZAL) 5 MG TABLET    Take 5 mg by mouth every evening. Reported on 09/25/2015    LEVOTHYROXINE (SYNTHROID, LEVOTHROID) 125 MCG TABLET    Take 1 tablet (125 mcg total) by mouth daily.   METHOTREXATE (RHEUMATREX) 2.5 MG TABLET    Take 2.5 mg by mouth once a week. 3 tablets once a week Caution:Chemotherapy. Protect from light.   MONTELUKAST (SINGULAIR) 10 MG TABLET    Take 1 tablet by mouth as needed.    SILDENAFIL (VIAGRA) 50 MG TABLET    Take by mouth as needed. Reported on 07/11/2015   TIMOLOL (BETIMOL) 0.25 % OPHTHALMIC SOLUTION    Apply to eye.   VALSARTAN (DIOVAN) 80 MG TABLET    Take 1 tablet (80 mg total) by mouth daily.    Review of Systems  Constitutional: Negative for fever, chills and appetite change.  Respiratory: Negative for chest tightness, shortness of breath and wheezing.   Cardiovascular: Negative for chest pain and palpitations.  Gastrointestinal: Positive for abdominal pain and constipation. Negative for nausea and vomiting.  Neurological: Positive for weakness (in legs).    Social History  Substance Use Topics  . Smoking status: Former Smoker -- 1.50 packs/day for 20 years    Types: Cigarettes    Quit date: 06/01/1978  . Smokeless tobacco: Never Used  . Alcohol Use: No     Comment: former Alcoholic, meets with AA once a week. Has been alcohol free for 34 years   Objective:   BP 130/70 mmHg  Pulse 62  Temp(Src) 97.8 F (36.6 C) (Oral)  Resp 16  Wt 195 lb (88.451 kg)  SpO2 95%  Physical Exam   General Appearance:    Alert, cooperative, no distress  Eyes:    PERRL, conjunctiva/corneas clear, EOM's intact       Lungs:     Clear to auscultation bilaterally, respirations unlabored  Heart:    Regular rate and rhythm  Neurologic:   Awake, alert, oriented x 3. No apparent focal neurological           defect.           Assessment & Plan:     1. Essential (primary) hypertension Doing well on valsartan  2. Hypothyroidism, unspecified hypothyroidism type  - TSH  3. Chronic kidney disease (CKD), stage III (moderate)  - VITAMIN D 25  Hydroxy (Vit-D Deficiency, Fractures) - Renal function panel  3. Gout, unspecified cause, unspecified chronicity, unspecified site Currently asymptomatic off of allopurinol.   5. Tinea corporis He notes a ring like rash on right buttocks that has been present for several weeks. It is itchy but only improves briefly with OTC hydrocortisone.  - clotrimazole-betamethasone (LOTRISONE) cream; Apply 1 application topically 2 (two) times daily.  Dispense: 30 g; Refill: 0       Lelon Huh, MD  [facility]

## 2015-10-02 LAB — RENAL FUNCTION PANEL
Albumin: 4.4 g/dL (ref 3.5–4.8)
BUN / CREAT RATIO: 16 (ref 10–24)
BUN: 23 mg/dL (ref 8–27)
CO2: 23 mmol/L (ref 18–29)
Calcium: 9.2 mg/dL (ref 8.6–10.2)
Chloride: 105 mmol/L (ref 96–106)
Creatinine, Ser: 1.48 mg/dL — ABNORMAL HIGH (ref 0.76–1.27)
GFR calc Af Amer: 51 mL/min/{1.73_m2} — ABNORMAL LOW (ref >59)
GFR, EST NON AFRICAN AMERICAN: 44 mL/min/{1.73_m2} — AB (ref >59)
GLUCOSE: 81 mg/dL (ref 65–99)
PHOSPHORUS: 3.6 mg/dL (ref 2.5–4.5)
POTASSIUM: 4.6 mmol/L (ref 3.5–5.2)
SODIUM: 143 mmol/L (ref 134–144)

## 2015-10-02 LAB — TSH: TSH: 1.98 u[IU]/mL (ref 0.450–4.500)

## 2015-10-02 LAB — VITAMIN D 25 HYDROXY (VIT D DEFICIENCY, FRACTURES): VIT D 25 HYDROXY: 38.9 ng/mL (ref 30.0–100.0)

## 2015-10-03 NOTE — Discharge Instructions (Signed)

## 2015-10-07 ENCOUNTER — Ambulatory Visit: Payer: PPO | Admitting: Student in an Organized Health Care Education/Training Program

## 2015-10-07 ENCOUNTER — Ambulatory Visit
Admission: RE | Admit: 2015-10-07 | Discharge: 2015-10-07 | Disposition: A | Discharge status: Home / Self Care | Payer: PPO | Source: Ambulatory Visit | Attending: Ophthalmology | Admitting: Ophthalmology

## 2015-10-07 ENCOUNTER — Encounter
Admission: RE | Disposition: A | Discharge status: Home / Self Care | Payer: Self-pay | Source: Ambulatory Visit | Attending: Ophthalmology

## 2015-10-07 DIAGNOSIS — Z881 Allergy status to other antibiotic agents status: Secondary | ICD-10-CM | POA: Diagnosis not present

## 2015-10-07 DIAGNOSIS — H401133 Primary open-angle glaucoma, bilateral, severe stage: Secondary | ICD-10-CM | POA: Diagnosis not present

## 2015-10-07 DIAGNOSIS — M109 Gout, unspecified: Secondary | ICD-10-CM | POA: Diagnosis not present

## 2015-10-07 DIAGNOSIS — Z79899 Other long term (current) drug therapy: Secondary | ICD-10-CM | POA: Diagnosis not present

## 2015-10-07 DIAGNOSIS — Z882 Allergy status to sulfonamides status: Secondary | ICD-10-CM | POA: Insufficient documentation

## 2015-10-07 DIAGNOSIS — H401113 Primary open-angle glaucoma, right eye, severe stage: Secondary | ICD-10-CM | POA: Insufficient documentation

## 2015-10-07 DIAGNOSIS — Z888 Allergy status to other drugs, medicaments and biological substances status: Secondary | ICD-10-CM | POA: Insufficient documentation

## 2015-10-07 DIAGNOSIS — Z87891 Personal history of nicotine dependence: Secondary | ICD-10-CM | POA: Diagnosis not present

## 2015-10-07 DIAGNOSIS — Z9889 Other specified postprocedural states: Secondary | ICD-10-CM | POA: Diagnosis not present

## 2015-10-07 DIAGNOSIS — E079 Disorder of thyroid, unspecified: Secondary | ICD-10-CM | POA: Diagnosis not present

## 2015-10-07 DIAGNOSIS — M1990 Unspecified osteoarthritis, unspecified site: Secondary | ICD-10-CM | POA: Diagnosis not present

## 2015-10-07 HISTORY — DX: Gastro-esophageal reflux disease without esophagitis: K21.9

## 2015-10-07 HISTORY — DX: Unspecified osteoarthritis, unspecified site: M19.90

## 2015-10-07 HISTORY — PX: PHOTOCOAGULATION WITH LASER: SHX6027

## 2015-10-07 HISTORY — DX: Presence of dental prosthetic device (complete) (partial): Z97.2

## 2015-10-07 SURGERY — PHOTOCOAGULATION, EYE, USING LASER
Anesthesia: Monitor Anesthesia Care | Site: Eye | Laterality: Right | Wound class: Clean

## 2015-10-07 MED ORDER — TETRACAINE HCL 0.5 % OP SOLN
OPHTHALMIC | Status: DC | PRN
  Administered 2015-10-07: 2 [drp] via OPHTHALMIC

## 2015-10-07 MED ORDER — LIDOCAINE HCL 2 % IJ SOLN
INTRAMUSCULAR | Status: DC | PRN
  Administered 2015-10-07: 6 mL via OPHTHALMIC

## 2015-10-07 MED ORDER — ALFENTANIL 500 MCG/ML IJ INJ
INJECTION | INTRAMUSCULAR | Status: DC | PRN
  Administered 2015-10-07: 1000 ug via INTRAVENOUS

## 2015-10-07 MED ORDER — NEOMYCIN-POLYMYXIN-DEXAMETH 3.5-10000-0.1 OP OINT
TOPICAL_OINTMENT | OPHTHALMIC | Status: DC | PRN
  Administered 2015-10-07: 1 via OPHTHALMIC

## 2015-10-07 SURGICAL SUPPLY — 11 items
BANDAGE EYE OVAL (MISCELLANEOUS) ×4 IMPLANT
DEVICE G-PROBE SGL USE (Laser) ×1 IMPLANT
DEVICE MICRO PULS P3 SGL USE (Laser) IMPLANT
G-PROBE SGL USE (Laser) ×2
GAUZE SPONGE 4X4 12PLY STRL (GAUZE/BANDAGES/DRESSINGS) ×2 IMPLANT
NDL RETROBULBAR .5 NSTRL (NEEDLE) ×2 IMPLANT
NEEDLE FILTER BLUNT 18X 1/2SAF (NEEDLE) ×1
NEEDLE FILTER BLUNT 18X1 1/2 (NEEDLE) ×1 IMPLANT
SYRINGE 10CC LL (SYRINGE) ×2 IMPLANT
WATER STERILE IRR 250ML POUR (IV SOLUTION) ×2 IMPLANT
WATER STERILE IRR 500ML POUR (IV SOLUTION) IMPLANT

## 2015-10-07 NOTE — OR Nursing (Signed)
G PROBE APPLIED TO LIMBUS WITH THE FOLLOWING SETTINGS --2000MILIWATTS 2000 MILISECONDS TO RIGHT EYE

## 2015-10-07 NOTE — Anesthesia Preprocedure Evaluation (Addendum)
Anesthesia Evaluation  Patient identified by MRN, date of birth, ID band Patient awake    Reviewed: Allergy & Precautions, H&P , NPO status , Patient's Chart, lab work & pertinent test results, reviewed documented beta blocker date and time   Airway Mallampati: II  TM Distance: >3 FB Neck ROM: full    Dental  (+) Partial Lower, Partial Upper   Pulmonary neg pulmonary ROS, former smoker,    Pulmonary exam normal breath sounds clear to auscultation       Cardiovascular Exercise Tolerance: Good hypertension,  Rhythm:regular Rate:Normal     Neuro/Psych negative neurological ROS  negative psych ROS   GI/Hepatic Neg liver ROS, GERD  ,  Endo/Other  Hypothyroidism   Renal/GU negative Renal ROS  negative genitourinary   Musculoskeletal   Abdominal   Peds  Hematology negative hematology ROS (+)   Anesthesia Other Findings   Reproductive/Obstetrics negative OB ROS                            Anesthesia Physical Anesthesia Plan  ASA: II  Anesthesia Plan: MAC and Regional   Post-op Pain Management:    Induction:   Airway Management Planned:   Additional Equipment:   Intra-op Plan:   Post-operative Plan:   Informed Consent: I have reviewed the patients History and Physical, chart, labs and discussed the procedure including the risks, benefits and alternatives for the proposed anesthesia with the patient or authorized representative who has indicated his/her understanding and acceptance.   Dental Advisory Given  Plan Discussed with: CRNA  Anesthesia Plan Comments:        Anesthesia Quick Evaluation

## 2015-10-07 NOTE — Anesthesia Procedure Notes (Signed)
Procedure Name: MAC Performed by: Tamarra Geiselman Pre-anesthesia Checklist: Patient identified, Emergency Drugs available, Suction available, Timeout performed and Patient being monitored Patient Re-evaluated:Patient Re-evaluated prior to inductionOxygen Delivery Method: Nasal cannula Placement Confirmation: positive ETCO2     

## 2015-10-07 NOTE — Transfer of Care (Signed)
Immediate Anesthesia Transfer of Care Note  Patient: DOVE PEOPLES Sr.  Procedure(s) Performed: Procedure(s): PHOTOCOAGULATION WITH LASER RIGHT EYE TRANS SCLERAL (Right)  Patient Location: PACU  Anesthesia Type: MAC, Regional  Level of Consciousness: awake, alert  and patient cooperative  Airway and Oxygen Therapy: Patient Spontanous Breathing and Patient connected to supplemental oxygen  Post-op Assessment: Post-op Vital signs reviewed, Patient's Cardiovascular Status Stable, Respiratory Function Stable, Patent Airway and No signs of Nausea or vomiting  Post-op Vital Signs: Reviewed and stable  Complications: No apparent anesthesia complications

## 2015-10-07 NOTE — H&P (Signed)
H+P reviewed and is up to date, please see paper chart.  

## 2015-10-07 NOTE — Anesthesia Postprocedure Evaluation (Signed)
Anesthesia Post Note  Patient: Jacob BELLANTI Sr.  Procedure(s) Performed: Procedure(s) (LRB): PHOTOCOAGULATION WITH LASER RIGHT EYE TRANS SCLERAL (Right)  Patient location during evaluation: PACU Anesthesia Type: MAC Level of consciousness: awake and alert Pain management: pain level controlled Vital Signs Assessment: post-procedure vital signs reviewed and stable Respiratory status: spontaneous breathing, nonlabored ventilation, respiratory function stable and patient connected to nasal cannula oxygen Cardiovascular status: blood pressure returned to baseline and stable Postop Assessment: no signs of nausea or vomiting Anesthetic complications: no    Alisa Graff

## 2015-10-07 NOTE — Op Note (Signed)
DATE OF SURGERY: 10/07/2015  PREOPERATIVE DIAGNOSES: Severe stage primary open angle glaucoma, right eye  POSTOPERATIVE DIAGNOSES: Same  PROCEDURES PERFORMED: Transscleral diode cyclophotocoagulation, right eye  SURGEON: Almon Hercules, M.D.  ANESTHESIA: MAC, retrobulbar  COMPLICATIONS: None.  INDICATIONS FOR PROCEDURE: Jacob EDWARDS MINDEL Sr. is a 82 y.o. year-old male with uncontrolled primary open angle glaucoma. The risks and benefits of glaucoma surgery were discussed with the patient, and he consented for a diode laser surgery.  PROCEDURE IN DETAIL: The eye for surgery was verified during the time-out procedure in the operating room. A retrobulbar block of lidocaine, Marcaine, and hyaluronidase was done for anesthesia. A G-probe was applied to the limbus with the following settings: 2024mW, 2000 miliseconds.  The eye was pressure patched closed. The patient tolerated the procedure well and was transferred to the Post-operative Care Unit in stable condition.

## 2015-10-08 ENCOUNTER — Encounter: Payer: Self-pay | Admitting: Ophthalmology

## 2015-10-26 ENCOUNTER — Other Ambulatory Visit: Payer: Self-pay | Admitting: Family Medicine

## 2015-11-04 DIAGNOSIS — L820 Inflamed seborrheic keratosis: Secondary | ICD-10-CM | POA: Diagnosis not present

## 2015-11-04 DIAGNOSIS — L308 Other specified dermatitis: Secondary | ICD-10-CM | POA: Diagnosis not present

## 2015-11-04 DIAGNOSIS — L299 Pruritus, unspecified: Secondary | ICD-10-CM | POA: Diagnosis not present

## 2015-12-09 ENCOUNTER — Other Ambulatory Visit: Payer: PPO

## 2015-12-09 DIAGNOSIS — R972 Elevated prostate specific antigen [PSA]: Secondary | ICD-10-CM | POA: Diagnosis not present

## 2015-12-10 LAB — PSA: Prostate Specific Ag, Serum: 5 ng/mL — ABNORMAL HIGH (ref 0.0–4.0)

## 2015-12-12 ENCOUNTER — Ambulatory Visit: Payer: PPO | Admitting: Urology

## 2015-12-16 ENCOUNTER — Encounter: Payer: Self-pay | Admitting: Urology

## 2015-12-16 ENCOUNTER — Ambulatory Visit (INDEPENDENT_AMBULATORY_CARE_PROVIDER_SITE_OTHER): Payer: PPO | Admitting: Urology

## 2015-12-16 VITALS — BP 152/80 | HR 65 | Ht 72.0 in | Wt 192.0 lb

## 2015-12-16 DIAGNOSIS — R972 Elevated prostate specific antigen [PSA]: Secondary | ICD-10-CM | POA: Diagnosis not present

## 2015-12-16 DIAGNOSIS — N528 Other male erectile dysfunction: Secondary | ICD-10-CM

## 2015-12-16 DIAGNOSIS — N401 Enlarged prostate with lower urinary tract symptoms: Secondary | ICD-10-CM | POA: Diagnosis not present

## 2015-12-16 DIAGNOSIS — N138 Other obstructive and reflux uropathy: Secondary | ICD-10-CM

## 2015-12-16 DIAGNOSIS — N529 Male erectile dysfunction, unspecified: Secondary | ICD-10-CM

## 2015-12-16 MED ORDER — SILDENAFIL CITRATE 20 MG PO TABS: ORAL_TABLET | ORAL | Status: DC

## 2015-12-16 NOTE — Progress Notes (Signed)
11:26 AM   Jacob Najjar Sr. 1934/03/21 [id number]  Referring provider: Birdie Sons, MD [address] [place], [place] [address]  Chief Complaint  Patient presents with  . Elevated PSA    57month    HPI: Patient is a 82 year old Caucasian male with a history of elevated PSA and BPH with LUTS who Presents today for a 6 month follow-up.     Elevated PSA Patient underwent a biopsy in December 2012 for a PSA of 6.0 while on finasteride. Biopsy returned negative.  Patient remained on finasteride and his PSA levels have remained under 6.  He had not been on his finasteride for the last 2 years and his PSA has returned at 10.2 ng/mL on 07/03/2015.  Finasteride was restarted and his PSA has continued to decrease. His most recent PSA was 5.0 ng/mL on 12/09/2015.  BPH WITH LUTS His IPSS score today is 15, which is moderate lower urinary tract symptomatology. He is mostly satisfied with his quality life due to his urinary symptoms. He has had these symptoms for several years.  He is currently taking Doxil sews and 8 mg daily and finasteride 5 mg daily. He denies any dysuria, hematuria or suprapubic pain.  He also denies any recent fevers, chills, nausea or vomiting.  He does not have a family history of PCa.      IPSS      12/16/15 1100       International Prostate Symptom Score   How often have you had the sensation of not emptying your bladder? Less than half the time     How often have you had to urinate less than every two hours? Less than half the time     How often have you found you stopped and started again several times when you urinated? About half the time     How often have you found it difficult to postpone urination? More than half the time     How often have you had a weak urinary stream? Less than half the time     How often have you had to strain to start urination? Less than 1 in 5 times     How many times did you typically get up at night to urinate? 1  Time     Total IPSS Score 15     Quality of Life due to urinary symptoms   If you were to spend the rest of your life with your urinary condition just the way it is now how would you feel about that? Mostly Satisfied        Score:  1-7 Mild 8-19 Moderate 20-35 Severe  Patient also mentioned that he is having difficulty with erections. He feels this started since he has been on the finasteride. He does not want to discontinue the finasteride at this time, but he would like a refill on the sildenafil.  PMH: Past Medical History  Diagnosis Date  . Gout   . History of chicken pox   . History of measles   . History of mumps   . History of pernicious anemia   . Hypothyroidism   . Glaucoma   . History of alcoholism ([place])   . ED (erectile dysfunction)   . BPH (benign prostatic hyperplasia)   . Elevated PSA   . HTN (hypertension)     controlled  . Arthritis     right hand  . Gout   . Wears partial dentures  upper and lower   . GERD (gastroesophageal reflux disease)     occasional    Surgical History: Past Surgical History  Procedure Laterality Date  . Parathyroidectomy  2004  . Tonsillectomy    . Photocoagulation with laser Right 10/07/2015    Procedure: PHOTOCOAGULATION WITH LASER RIGHT EYE TRANS SCLERAL;  Surgeon: Ronnell Freshwater, MD;  Location: [facility];  Service: Ophthalmology;  Laterality: Right;    Home Medications:    Medication List       This list is accurate as of: 12/16/15 11:26 AM.  Always use your most recent med list.               brimonidine 0.1 % Soln  Commonly known as:  ALPHAGAN P  Apply to eye. Reported on 07/11/2015     dorzolamide-timolol 22.3-6.8 MG/ML ophthalmic solution  Commonly known as:  COSOPT  Apply to eye.     doxazosin 8 MG tablet  Commonly known as:  CARDURA  TAKE 1 TABLET (8 MG TOTAL) BY MOUTH DAILY.     finasteride 5 MG tablet  Commonly known as:  PROSCAR  Take 1 tablet (5 mg total) by mouth daily.  Reported on 07/11/2015     latanoprost 0.005 % ophthalmic solution  Commonly known as:  XALATAN  Apply 1 drop to eye daily.     levothyroxine 125 MCG tablet  Commonly known as:  SYNTHROID, LEVOTHROID  TAKE 1 TABLET (125 MCG TOTAL) BY MOUTH DAILY.     sildenafil 20 MG tablet  Commonly known as:  REVATIO  Take 3 to 5 tablets two hours before intercouse on an empty stomach.  Do not take with nitrates.     valsartan 80 MG tablet  Commonly known as:  DIOVAN  TAKE 1 TABLET (80 MG TOTAL) BY MOUTH DAILY.        Allergies:  Allergies  Allergen Reactions  . Krystexxa  [Pegloticase] Anaphylaxis  . Allopurinol     Abdominal pain and itching  . Ciprofloxacin     causes mouth to itch  . Levofloxacin Other (See Comments)    Tendon pain  . Niacin     Other reaction(s): UNKNOWN  . Sulfa Antibiotics Itching  . Uloric  [Febuxostat]     Other reaction(s): Abdominal pain    Family History: Family History  Problem Relation Age of Onset  . Asthma Mother   . Heart attack Father   . Parkinson's disease Father   . Kidney disease Neg Hx   . Prostate cancer Neg Hx     maybe paternal grandfather ? unsure    Social History:  reports that he quit smoking about 37 years ago. His smoking use included Cigarettes. He has a 30 pack-year smoking history. He has never used smokeless tobacco. He reports that he does not drink alcohol or use illicit drugs.  ROS: UROLOGY Frequent Urination?: No Hard to postpone urination?: No Burning/pain with urination?: No Get up at night to urinate?: No Leakage of urine?: No Urine stream starts and stops?: No Trouble starting stream?: No Do you have to strain to urinate?: No Blood in urine?: No Urinary tract infection?: No Sexually transmitted disease?: No Injury to kidneys or bladder?: No Painful intercourse?: No Weak stream?: No Erection problems?: Yes Penile pain?: No  Gastrointestinal Nausea?: No Vomiting?: No Indigestion/heartburn?:  No Diarrhea?: No Constipation?: No  Constitutional Fever: No Night sweats?: No Weight loss?: No Fatigue?: No  Skin Skin rash/lesions?: No Itching?: Yes  Eyes Blurred vision?:  No Double vision?: No  Ears/Nose/Throat Sore throat?: No Sinus problems?: No  Hematologic/Lymphatic Swollen glands?: No Easy bruising?: No  Cardiovascular Leg swelling?: No Chest pain?: No  Respiratory Cough?: No Shortness of breath?: No  Endocrine Excessive thirst?: No  Musculoskeletal Back pain?: No Joint pain?: No  Neurological Headaches?: No Dizziness?: No  Psychologic Depression?: No Anxiety?: No  Physical Exam: BP 152/80 mmHg  Pulse 65  Ht 6' (1.829 m)  Wt 192 lb (87.091 kg)  BMI 26.03 kg/m2  Constitutional: Well nourished. Alert and oriented, No acute distress. HEENT: [place] AT, moist mucus membranes. Trachea midline, no masses. Cardiovascular: No clubbing, cyanosis, or edema. Respiratory: Normal respiratory effort, no increased work of breathing. GI: Abdomen is soft, non tender, non distended, no abdominal masses. Liver and spleen not palpable.  No hernias appreciated.  Stool sample for occult testing is not indicated.   GU: No CVA tenderness.  No bladder fullness or masses.  Patient with circumcised phallus.   Urethral meatus is patent.  No penile discharge. No penile lesions or rashes. Scrotum without lesions, cysts, rashes and/or edema.  Testicles are located scrotally bilaterally. No masses are appreciated in the testicles. Left and right epididymis are normal. Rectal: Patient with  normal sphincter tone. Anus and perineum without scarring or rashes. No rectal masses are appreciated. Prostate is approximately 55 grams, no nodules are appreciated. Seminal vesicles are normal. Skin: No rashes, bruises or suspicious lesions. Lymph: No cervical or inguinal adenopathy. Neurologic: Grossly intact, no focal deficits, moving all 4 extremities. Psychiatric: Normal mood and  affect.  Laboratory Data: PSA History  6.0 ng/mL on 05/12/2011-biopsy negative-on finasteride  3.6 ng/mL on 10/13/2011-on finasteride  5.4 ng/mL on 04/13/2012-on finasteride  3.2 ng/mL on 10/11/2012-on finasteride  3.6 ng/mL on 04/13/2013-on finasteride           10.2 ng/mL on 07/03/2015-not on finasteride   9.4 ng/mL on 07/11/2015  6.4 ng/mL on 08/09/2015  5.0 ng/mL on 12/09/2015   Lab Results  Component Value Date   WBC 6.4 07/03/2015   HCT 39.0 07/03/2015   MCV 80 07/03/2015   PLT 217 07/03/2015   Lab Results  Component Value Date   CREATININE 1.48* 10/01/2015   Lab Results  Component Value Date   TSH 1.980 10/01/2015      Component Value Date/Time   CHOL 182 07/03/2015 0813   CHOL 178 11/17/2013   HDL 40 07/03/2015 0813   HDL 37 11/17/2013   CHOLHDL 4.6 07/03/2015 0813   LDLCALC 127* 07/03/2015 0813   LDLCALC 121 11/17/2013   Lab Results  Component Value Date   AST 18 07/03/2015   Lab Results  Component Value Date   ALT 11 07/03/2015    Assessment & Plan:    1. Elevated PSA:   Patient's PSA is currently 5.0. It has continued a downward trend since initiating finasteride. We did discuss the AUA guidelines regarding PSA screening in men over 70.  I stated it would be acceptable if he would like to discontinue monitoring his PSA at this time. We'll continue to perform prostate exams. If he should develop a nodule or worsening urinary symptoms, we will pursue further studies.  He stated that he would still like to have his PSA monitored. He will return in 1 year for exam and PSA.    2. BPH with LUTS  - IPSS score is 15/2    - Continue doxazosin 8 mg daily and finasteride 5 mg daily  - RTC in 12 months  for IPSS and exam   3. Erectile dysfunction:   - Refill given for sildenafil  - RTC in 12 months for repeat SHIM score and exam     Return in about 1 year (around 12/15/2016) for IPSS, SHIM and exam.  These notes generated with voice recognition software.  I apologize for typographical errors.  Zara Council, [facility] [address], [place], [place] [address] [phone]

## 2016-01-13 DIAGNOSIS — L308 Other specified dermatitis: Secondary | ICD-10-CM | POA: Diagnosis not present

## 2016-01-13 DIAGNOSIS — Z5181 Encounter for therapeutic drug level monitoring: Secondary | ICD-10-CM | POA: Diagnosis not present

## 2016-01-14 DIAGNOSIS — Z5181 Encounter for therapeutic drug level monitoring: Secondary | ICD-10-CM | POA: Diagnosis not present

## 2016-01-14 DIAGNOSIS — L308 Other specified dermatitis: Secondary | ICD-10-CM | POA: Diagnosis not present

## 2016-02-26 DIAGNOSIS — H401133 Primary open-angle glaucoma, bilateral, severe stage: Secondary | ICD-10-CM | POA: Diagnosis not present

## 2016-03-02 DIAGNOSIS — L308 Other specified dermatitis: Secondary | ICD-10-CM | POA: Diagnosis not present

## 2016-03-02 DIAGNOSIS — Z79899 Other long term (current) drug therapy: Secondary | ICD-10-CM | POA: Diagnosis not present

## 2016-03-03 DIAGNOSIS — L308 Other specified dermatitis: Secondary | ICD-10-CM | POA: Diagnosis not present

## 2016-03-03 DIAGNOSIS — Z79899 Other long term (current) drug therapy: Secondary | ICD-10-CM | POA: Diagnosis not present

## 2016-03-07 ENCOUNTER — Ambulatory Visit (INDEPENDENT_AMBULATORY_CARE_PROVIDER_SITE_OTHER): Payer: PPO

## 2016-03-07 DIAGNOSIS — Z23 Encounter for immunization: Secondary | ICD-10-CM | POA: Diagnosis not present

## 2016-04-04 ENCOUNTER — Ambulatory Visit (INDEPENDENT_AMBULATORY_CARE_PROVIDER_SITE_OTHER): Payer: PPO | Admitting: Family Medicine

## 2016-04-04 ENCOUNTER — Encounter: Payer: Self-pay | Admitting: Family Medicine

## 2016-04-04 VITALS — BP 128/62 | HR 72 | Temp 97.6°F | Resp 14 | Wt 193.0 lb

## 2016-04-04 DIAGNOSIS — S39012A Strain of muscle, fascia and tendon of lower back, initial encounter: Secondary | ICD-10-CM

## 2016-04-04 MED ORDER — CYCLOBENZAPRINE HCL 5 MG PO TABS: 5.0000 mg | ORAL_TABLET | Freq: Three times a day (TID) | ORAL | 0 refills | Status: AC | PRN

## 2016-04-04 MED ORDER — NAPROXEN 500 MG PO TABS: 500.0000 mg | ORAL_TABLET | Freq: Two times a day (BID) | ORAL | 0 refills | Status: AC

## 2016-04-04 NOTE — Patient Instructions (Signed)

## 2016-04-04 NOTE — Progress Notes (Signed)
Patient: Jacob FREID Sr. Male    DOB: [birth year]   82 y.o.   MRN: [id number] Visit Date: 04/04/2016  Today's Provider: Lelon Huh, MD   Chief Complaint  Patient presents with  . Back Pain   Subjective:    HPI  Patient states he hurt his back last night, he was sharpening a pencil on grounding wheel and he was bent over and then stretched forward with his body and felt a muscle pull in the lower middle area of his back. He is having some weakness in his legs with movement due to pain. He states this is not the first time he did this. No radiation of pain into legs.     Allergies  Allergen Reactions  . Krystexxa  [Pegloticase] Anaphylaxis  . Allopurinol     Abdominal pain and itching  . Ciprofloxacin     causes mouth to itch  . Levofloxacin Other (See Comments)    Tendon pain  . Niacin     Other reaction(s): UNKNOWN  . Sulfa Antibiotics Itching  . Uloric  [Febuxostat]     Other reaction(s): Abdominal pain     Current Outpatient Prescriptions:  .  brimonidine (ALPHAGAN P) 0.1 % SOLN, Apply to eye. Reported on 07/11/2015, Disp: , Rfl:  .  cyanocobalamin (,VITAMIN B-12,) 1000 MCG/ML injection, Inject into the muscle. Every 2 months, Disp: , Rfl:  .  dorzolamide-timolol (COSOPT) 22.3-6.8 MG/ML ophthalmic solution, Apply to eye., Disp: , Rfl:  .  doxazosin (CARDURA) 8 MG tablet, TAKE 1 TABLET (8 MG TOTAL) BY MOUTH DAILY., Disp: 30 tablet, Rfl: 5 .  latanoprost (XALATAN) 0.005 % ophthalmic solution, Apply 1 drop to eye daily., Disp: , Rfl:  .  levothyroxine (SYNTHROID, LEVOTHROID) 125 MCG tablet, TAKE 1 TABLET (125 MCG TOTAL) BY MOUTH DAILY., Disp: 30 tablet, Rfl: 12 .  valsartan (DIOVAN) 80 MG tablet, TAKE 1 TABLET (80 MG TOTAL) BY MOUTH DAILY., Disp: 30 tablet, Rfl: 12 .  finasteride (PROSCAR) 5 MG tablet, Take 1 tablet (5 mg total) by mouth daily. Reported on 07/11/2015 (Patient not taking: Reported on 04/04/2016), Disp: 30 tablet, Rfl: 12 .  sildenafil (REVATIO) 20 MG  tablet, Take 3 to 5 tablets two hours before intercouse on an empty stomach.  Do not take with nitrates. (Patient not taking: Reported on 04/04/2016), Disp: 50 tablet, Rfl: 3  Review of Systems  Respiratory: Negative.   Cardiovascular: Negative.   Musculoskeletal: Positive for arthralgias, back pain, gait problem and myalgias.  Neurological: Positive for weakness (legs).    Social History  Substance Use Topics  . Smoking status: Former Smoker    Packs/day: 1.50    Years: 20.00    Types: Cigarettes    Quit date: 06/01/1978  . Smokeless tobacco: Never Used  . Alcohol use No     Comment: former Alcoholic, meets with AA once a week. Has been alcohol free for 34 years   Objective:   BP 128/62   Pulse 72   Temp 97.6 F (36.4 C)   Resp 14   Wt 193 lb (87.5 kg)   BMI 26.18 kg/m   Physical Exam   General Appearance:    Alert, cooperative, no distress  Eyes:    PERRL, conjunctiva/corneas clear, EOM's intact       Lungs:     Clear to auscultation bilaterally, respirations unlabored  Heart:    Regular rate and rhythm  Neurologic:   Awake, alert, oriented x 3. No  apparent focal neurological           defect.   MS:   Tender bilateral paralumbar muscles with spasm noted. MS both legs +5/5.        Assessment & Plan:      1. Low back strain, initial encounter Counseled to apply ice for 48-72 hours after initial injury and may then change to heat.  - cyclobenzaprine (FLEXERIL) 5 MG tablet; Take 1 tablet (5 mg total) by mouth 3 (three) times daily as needed for muscle spasms.  Dispense: 30 tablet; Refill: 0 - naproxen (NAPROSYN) 500 MG tablet; Take 1 tablet (500 mg total) by mouth 2 (two) times daily with a meal.  Dispense: 30 tablet; Refill: 0  Call if symptoms change or if not rapidly improving.          Lelon Huh, MD  [facility]

## 2016-04-07 DIAGNOSIS — M9901 Segmental and somatic dysfunction of cervical region: Secondary | ICD-10-CM | POA: Diagnosis not present

## 2016-04-07 DIAGNOSIS — M9902 Segmental and somatic dysfunction of thoracic region: Secondary | ICD-10-CM | POA: Diagnosis not present

## 2016-04-09 DIAGNOSIS — M9902 Segmental and somatic dysfunction of thoracic region: Secondary | ICD-10-CM | POA: Diagnosis not present

## 2016-04-09 DIAGNOSIS — M9901 Segmental and somatic dysfunction of cervical region: Secondary | ICD-10-CM | POA: Diagnosis not present

## 2016-04-14 DIAGNOSIS — M9902 Segmental and somatic dysfunction of thoracic region: Secondary | ICD-10-CM | POA: Diagnosis not present

## 2016-04-14 DIAGNOSIS — M9901 Segmental and somatic dysfunction of cervical region: Secondary | ICD-10-CM | POA: Diagnosis not present

## 2016-04-16 DIAGNOSIS — D485 Neoplasm of uncertain behavior of skin: Secondary | ICD-10-CM | POA: Diagnosis not present

## 2016-04-16 DIAGNOSIS — L308 Other specified dermatitis: Secondary | ICD-10-CM | POA: Diagnosis not present

## 2016-04-16 DIAGNOSIS — R21 Rash and other nonspecific skin eruption: Secondary | ICD-10-CM | POA: Diagnosis not present

## 2016-05-14 DIAGNOSIS — Z79899 Other long term (current) drug therapy: Secondary | ICD-10-CM | POA: Diagnosis not present

## 2016-05-14 DIAGNOSIS — R21 Rash and other nonspecific skin eruption: Secondary | ICD-10-CM | POA: Diagnosis not present

## 2016-05-20 ENCOUNTER — Ambulatory Visit: Payer: PPO

## 2016-05-27 ENCOUNTER — Ambulatory Visit (INDEPENDENT_AMBULATORY_CARE_PROVIDER_SITE_OTHER): Payer: PPO

## 2016-05-27 VITALS — BP 123/72 | HR 76 | Temp 97.9°F | Ht 71.5 in | Wt 197.2 lb

## 2016-05-27 DIAGNOSIS — Z0000 Encounter for general adult medical examination without abnormal findings: Secondary | ICD-10-CM | POA: Diagnosis not present

## 2016-05-27 NOTE — Patient Instructions (Signed)
Jacob Nielsen , Thank you for taking time to come for your Medicare Wellness Visit. I appreciate your ongoing commitment to your health goals. Please review the following plan we discussed and let me know if I can assist you in the future.   These are the goals we discussed: Goals    . Increase water intake          Starting 05/27/16, I will increase my water intake to 5 glasses a day.       This is a list of the screening recommended for you and due dates:  Health Maintenance  Topic Date Due  . Tetanus Vaccine  10/19/2022  . Flu Shot  Completed  . Shingles Vaccine  Completed  . Pneumonia vaccines  Completed   Preventive Care for Adults  A healthy lifestyle and preventive care can promote health and wellness. Preventive health guidelines for adults include the following key practices.  . A routine yearly physical is a good way to check with your health care provider about your health and preventive screening. It is a chance to share any concerns and updates on your health and to receive a thorough exam.  . Visit your dentist for a routine exam and preventive care every 6 months. Brush your teeth twice a day and floss once a day. Good oral hygiene prevents tooth decay and gum disease.  . The frequency of eye exams is based on your age, health, family medical history, use  of contact lenses, and other factors. Follow your health care provider's ecommendations for frequency of eye exams.  . Eat a healthy diet. Foods like vegetables, fruits, whole grains, low-fat dairy products, and lean protein foods contain the nutrients you need without too many calories. Decrease your intake of foods high in solid fats, added sugars, and salt. Eat the right amount of calories for you. Get information about a proper diet from your health care provider, if necessary.  . Regular physical exercise is one of the most important things you can do for your health. Most adults should get at least 150 minutes of  moderate-intensity exercise (any activity that increases your heart rate and causes you to sweat) each week. In addition, most adults need muscle-strengthening exercises on 2 or more days a week.  Silver Sneakers may be a benefit available to you. To determine eligibility, you may visit the website: www.silversneakers.com or contact program at 8654310895 Mon-Fri between 8AM-8PM.   . Maintain a healthy weight. The body mass index (BMI) is a screening tool to identify possible weight problems. It provides an estimate of body fat based on height and weight. Your health care provider can find your BMI and can help you achieve or maintain a healthy weight.   For adults 20 years and older: ? A BMI below 18.5 is considered underweight. ? A BMI of 18.5 to 24.9 is normal. ? A BMI of 25 to 29.9 is considered overweight. ? A BMI of 30 and above is considered obese.   . Maintain normal blood lipids and cholesterol levels by exercising and minimizing your intake of saturated fat. Eat a balanced diet with plenty of fruit and vegetables. Blood tests for lipids and cholesterol should begin at age 48 and be repeated every 5 years. If your lipid or cholesterol levels are high, you are over 50, or you are at high risk for heart disease, you may need your cholesterol levels checked more frequently. Ongoing high lipid and cholesterol levels should be treated with  medicines if diet and exercise are not working.  . If you smoke, find out from your health care provider how to quit. If you do not use tobacco, please do not start.  . If you choose to drink alcohol, please do not consume more than 2 drinks per day. One drink is considered to be 12 ounces (355 mL) of beer, 5 ounces (148 mL) of wine, or 1.5 ounces (44 mL) of liquor.  . If you are 70-65 years old, ask your health care provider if you should take aspirin to prevent strokes.  . Use sunscreen. Apply sunscreen liberally and repeatedly throughout the day. You  should seek shade when your shadow is shorter than you. Protect yourself by wearing long sleeves, pants, a wide-brimmed hat, and sunglasses year round, whenever you are outdoors.  . Once a month, do a whole body skin exam, using a mirror to look at the skin on your back. Tell your health care provider of new moles, moles that have irregular borders, moles that are larger than a pencil eraser, or moles that have changed in shape or color.

## 2016-05-27 NOTE — Progress Notes (Signed)
Subjective:   Jacob Darlyn Read Sr. is a 82 y.o. male who presents for Medicare Annual/Subsequent preventive examination.  Review of Systems: N/A  Cardiac Risk Factors include: advanced age (>52men, >61 women);dyslipidemia;hypertension;male gender     Objective:    Vitals: BP 123/72 (BP Location: Left Arm)   Pulse 76   Temp 97.9 F (36.6 C) (Oral)   Ht 5' 11.5" (1.816 m)   Wt 197 lb 4 oz (89.5 kg)   BMI 27.13 kg/m   Body mass index is 27.13 kg/m.  Tobacco History  Smoking Status  . Former Smoker  . Packs/day: 1.50  . Years: 20.00  . Types: Cigarettes  . Quit date: 06/01/1978  Smokeless Tobacco  . Never Used     Counseling given: Not Answered   Past Medical History:  Diagnosis Date  . Arthritis    right hand  . BPH (benign prostatic hyperplasia)   . ED (erectile dysfunction)   . Elevated PSA   . GERD (gastroesophageal reflux disease)    occasional  . Glaucoma   . Gout   . Gout   . History of alcoholism ([place])   . History of chicken pox   . History of measles   . History of mumps   . History of pernicious anemia   . HTN (hypertension)    controlled  . Hypothyroidism   . Wears partial dentures    upper and lower    Past Surgical History:  Procedure Laterality Date  . EYE SURGERY     laser eye surgery on right eye for glaucoma  . PARATHYROIDECTOMY  2004  . PHOTOCOAGULATION WITH LASER Right 10/07/2015   Procedure: PHOTOCOAGULATION WITH LASER RIGHT EYE TRANS SCLERAL;  Surgeon: Ronnell Freshwater, MD;  Location: [facility];  Service: Ophthalmology;  Laterality: Right;  . TONSILLECTOMY     Family History  Problem Relation Age of Onset  . Asthma Mother   . Heart attack Father   . Parkinson's disease Father   . Kidney disease Neg Hx   . Prostate cancer Neg Hx     maybe paternal grandfather ? unsure   History  Sexual Activity  . Sexual activity: Not on file    Outpatient Encounter Prescriptions as of 05/27/2016  Medication Sig  .  brimonidine (ALPHAGAN P) 0.1 % SOLN Apply to eye. Reported on 07/11/2015  . colchicine 0.6 MG tablet Take 0.6 mg by mouth daily.  . cyanocobalamin (,VITAMIN B-12,) 1000 MCG/ML injection Inject into the muscle. Every 2 months  . dorzolamide-timolol (COSOPT) 22.3-6.8 MG/ML ophthalmic solution Apply to eye.  Marland Kitchen doxazosin (CARDURA) 8 MG tablet TAKE 1 TABLET (8 MG TOTAL) BY MOUTH DAILY. (Patient taking differently: TAKE 1/2 TABLET (8 MG TOTAL) BY MOUTH DAILY.)  . folic acid (FOLVITE) 1 MG tablet Take 1 mg by mouth daily.  Marland Kitchen latanoprost (XALATAN) 0.005 % ophthalmic solution Apply 1 drop to eye daily.  Marland Kitchen levothyroxine (SYNTHROID, LEVOTHROID) 125 MCG tablet TAKE 1 TABLET (125 MCG TOTAL) BY MOUTH DAILY.  . methotrexate (RHEUMATREX) 10 MG tablet Take 10 mg by mouth once a week. Caution: Chemotherapy. Protect from light.  . valsartan (DIOVAN) 80 MG tablet TAKE 1 TABLET (80 MG TOTAL) BY MOUTH DAILY.  . finasteride (PROSCAR) 5 MG tablet Take 1 tablet (5 mg total) by mouth daily. Reported on 07/11/2015 (Patient not taking: Reported on 05/27/2016)  . sildenafil (REVATIO) 20 MG tablet Take 3 to 5 tablets two hours before intercouse on an empty stomach.  Do not  take with nitrates. (Patient not taking: Reported on 05/27/2016)   No facility-administered encounter medications on file as of 05/27/2016.     Activities of Daily Living In your present state of health, do you have any difficulty performing the following activities: 05/27/2016 10/07/2015  Hearing? N N  Vision? Y N  Difficulty concentrating or making decisions? N N  Walking or climbing stairs? N N  Dressing or bathing? N N  Doing errands, shopping? N -  Preparing Food and eating ? N -  Using the Toilet? N -  In the past six months, have you accidently leaked urine? N -  Do you have problems with loss of bowel control? N -  Managing your Medications? N -  Managing your Finances? N -  Housekeeping or managing your Housekeeping? N -  Some recent data  might be hidden    Patient Care Team: Birdie Sons, MD as PCP - General (Family Medicine) Regino Schultze, MD as Referring Physician (Dermatology) Ronnell Freshwater, MD as Referring Physician (Ophthalmology)   Assessment:     Exercise Activities and Dietary recommendations Current Exercise Habits: Home exercise routine, Type of exercise: walking;strength training/weights, Time (Minutes): > 60, Frequency (Times/Week): 7, Weekly Exercise (Minutes/Week): 0  Goals    . Increase water intake          Starting 05/27/16, I will increase my water intake to 5 glasses a day.      Fall Risk Fall Risk  05/27/2016 04/08/2015  Falls in the past year? No No   Depression Screen PHQ 2/9 Scores 05/27/2016 04/08/2015  PHQ - 2 Score 0 0  PHQ- 9 Score - 1    Cognitive Function     6CIT Screen 05/27/2016  What Year? 0 points  What month? 0 points  What time? 0 points  Count back from 20 0 points  Months in reverse 2 points  Repeat phrase 2 points  Total Score 4    Immunization History  Administered Date(s) Administered  . Influenza, High Dose Seasonal PF 03/08/2015, 03/07/2016  . Influenza-Unspecified 01/30/2014  . Pneumococcal Conjugate-13 02/14/2014  . Pneumococcal Polysaccharide-23 06/01/2008  . Tdap 10/18/2012  . Zoster 09/11/2014   Screening Tests Health Maintenance  Topic Date Due  . TETANUS/TDAP  10/19/2022  . INFLUENZA VACCINE  Completed  . ZOSTAVAX  Completed  . PNA vac Low Risk Adult  Completed      Plan:  I have personally reviewed and addressed the Medicare Annual Wellness questionnaire and have noted the following in the patient's chart:  A. Medical and social history B. Use of alcohol, tobacco or illicit drugs  C. Current medications and supplements D. Functional ability and status E.  Nutritional status F.  Physical activity G. Advance directives H. List of other physicians I.  Hospitalizations, surgeries, and ER visits in previous 12 months J.    [place] such as hearing and vision if needed, cognitive and depression L. Referrals and appointments - none  In addition, I have reviewed and discussed with patient certain preventive protocols, quality metrics, and best practice recommendations. A written personalized care plan for preventive services as well as general preventive health recommendations were provided to patient.  See attached scanned questionnaire for additional information.   Signed,  Fabio Neighbors, LPN Nurse Health Advisor  MD Recommendations: none.  I have reviewed the health advisor's note, was available for consultation, and agree with documentation and plan  Lelon Huh, MD

## 2016-06-24 ENCOUNTER — Ambulatory Visit: Payer: PPO | Admitting: Family Medicine

## 2016-07-15 ENCOUNTER — Other Ambulatory Visit: Payer: Self-pay | Admitting: Family Medicine

## 2016-07-22 ENCOUNTER — Encounter: Payer: Self-pay | Admitting: Family Medicine

## 2016-07-22 ENCOUNTER — Ambulatory Visit (INDEPENDENT_AMBULATORY_CARE_PROVIDER_SITE_OTHER): Payer: PPO | Admitting: Family Medicine

## 2016-07-22 VITALS — BP 110/60 | HR 57 | Temp 98.1°F | Resp 16 | Ht 72.0 in | Wt 194.0 lb

## 2016-07-22 DIAGNOSIS — I10 Essential (primary) hypertension: Secondary | ICD-10-CM

## 2016-07-22 DIAGNOSIS — Z0000 Encounter for general adult medical examination without abnormal findings: Secondary | ICD-10-CM | POA: Diagnosis not present

## 2016-07-22 DIAGNOSIS — N183 Chronic kidney disease, stage 3 unspecified: Secondary | ICD-10-CM

## 2016-07-22 DIAGNOSIS — B354 Tinea corporis: Secondary | ICD-10-CM | POA: Diagnosis not present

## 2016-07-22 DIAGNOSIS — E039 Hypothyroidism, unspecified: Secondary | ICD-10-CM

## 2016-07-22 DIAGNOSIS — M8580 Other specified disorders of bone density and structure, unspecified site: Secondary | ICD-10-CM

## 2016-07-22 DIAGNOSIS — D510 Vitamin B12 deficiency anemia due to intrinsic factor deficiency: Secondary | ICD-10-CM | POA: Diagnosis not present

## 2016-07-22 DIAGNOSIS — J069 Acute upper respiratory infection, unspecified: Secondary | ICD-10-CM

## 2016-07-22 MED ORDER — CLOTRIMAZOLE-BETAMETHASONE 1-0.05 % EX CREA: 1.0000 "application " | TOPICAL_CREAM | Freq: Two times a day (BID) | CUTANEOUS | 5 refills | Status: DC

## 2016-07-22 NOTE — Progress Notes (Signed)
Patient: Jacob Plack., Male    DOB: Jan 29, 1934, 83 y.o.   MRN: [id number] Visit Date: 07/22/2016  Today's Provider: Lelon Huh, MD   Chief Complaint  Patient presents with  . Annual Exam  . Hypertension  . Chronic Kidney Disease  . Hypothyroidism   Subjective:    Annual physical Jacob DIALLO PAWLUS Sr. is a 83 y.o. male. He feels well. He reports exercising yes/walking. He reports he is sleeping poorly.  -----------------------------------------------------------   Hypothyroidism, unspecified hypothyroidism type From 10/01/2015-no changes. Lab Results  Component Value Date   TSH 1.980 10/01/2015     Chronic kidney disease (CKD), stage III (moderate) From 10/01/2015-labs checked, no changes. BMET    Component Value Date/Time   NA 143 10/01/2015 1211   K 4.6 10/01/2015 1211   CL 105 10/01/2015 1211   CO2 23 10/01/2015 1211   GLUCOSE 81 10/01/2015 1211   BUN 23 10/01/2015 1211   CREATININE 1.48 (H) 10/01/2015 1211   CALCIUM 9.2 10/01/2015 1211   GFRNONAA 44 (L) 10/01/2015 1211   GFRAA 51 (L) 10/01/2015 1211    Gout, unspecified cause, unspecified chronicity, unspecified site From 10/01/2015-no changes, off allopurinol.  Lab Results  Component Value Date   LABURIC 8.4 07/03/2015       Hypertension, follow-up:  BP Readings from Last 3 Encounters:  05/27/16 123/72  04/04/16 128/62  12/16/15 (!) 152/80    He was last seen for hypertension 9 months ago.  BP at that visit was 130/70. Management since that visit includes; no changes.He reports good compliance with treatment. He is not having side effects. none He is exercising. He is adherent to low salt diet.   Outside blood pressures are 120/80. He is experiencing none.  Patient denies none.   Cardiovascular risk factors include advanced age (older than 52 for men, 13 for women).  Use of agents associated with hypertension: none.    ----------------------------------------------------------------    Review of Systems  Constitutional: Negative for chills, diaphoresis and fever.  HENT: Positive for congestion, hearing loss and sore throat. Negative for ear discharge, ear pain, nosebleeds, sinus pain and tinnitus.   Eyes: Negative for photophobia, pain, discharge and redness.  Respiratory: Positive for cough. Negative for shortness of breath, wheezing and stridor.   Cardiovascular: Negative for chest pain, palpitations and leg swelling.  Gastrointestinal: Negative for abdominal pain, blood in stool, constipation, diarrhea, nausea and vomiting.  Endocrine: Negative for polydipsia.  Genitourinary: Negative for dysuria, flank pain, frequency, hematuria and urgency.  Musculoskeletal: Negative for back pain, myalgias and neck pain.  Skin: Negative for rash.  Allergic/Immunologic: Negative for environmental allergies.  Neurological: Negative for dizziness, tremors, seizures, weakness and headaches.  Hematological: Does not bruise/bleed easily.  Psychiatric/Behavioral: Negative for hallucinations and suicidal ideas. The patient is not nervous/anxious.   All other systems reviewed and are negative.   Social History   Social History  . Marital status: Married    Spouse name: Jacob Nielsen  . Number of children: N/A  . Years of education: N/A   Occupational History  . Retired     Previously worked at a [employer]   Social History Main Topics  . Smoking status: Former Smoker    Packs/day: 1.50    Years: 20.00    Types: Cigarettes    Quit date: 06/01/1978  . Smokeless tobacco: Never Used  . Alcohol use No     Comment: former Alcoholic, meets with AA once a week. Has  been alcohol free for 34 years  . Drug use: No  . Sexual activity: Not on file   Other Topics Concern  . Not on file   Social History Narrative  . No narrative on file    Past Medical History:  Diagnosis Date  . Arthritis    right hand  .  BPH (benign prostatic hyperplasia)   . ED (erectile dysfunction)   . Elevated PSA   . GERD (gastroesophageal reflux disease)    occasional  . Glaucoma   . Gout   . Gout   . History of alcoholism ([place])   . History of chicken pox   . History of measles   . History of mumps   . History of pernicious anemia   . HTN (hypertension)    controlled  . Hypothyroidism   . Wears partial dentures    upper and lower      Patient Active Problem List   Diagnosis Date Noted  . Elevated PSA 07/13/2015  . Allergic rhinitis 03/08/2015  . BPH (benign prostatic hyperplasia) 03/08/2015  . Chronic kidney disease (CKD), stage III (moderate) 03/08/2015  . Essential (primary) hypertension 03/08/2015  . Gouty arthritis 03/08/2015  . Gout 03/08/2015  . H/O alcohol abuse 03/08/2015  . Hyperlipidemia 03/08/2015  . Hypothyroid 03/08/2015  . Open-angle glaucoma 03/08/2015  . Osteoporosis 03/08/2015  . Pernicious anemia 03/08/2015  . Pruritus 03/08/2015  . Psoriasiform dermatitis 04/18/2013  . Other long term (current) drug therapy 03/15/2013  . Polypharmacy 03/15/2013  . Dermatophytosis of groin 08/02/2012  . Dermatitis, eczematoid 05/17/2012    Past Surgical History:  Procedure Laterality Date  . EYE SURGERY     laser eye surgery on right eye for glaucoma  . PARATHYROIDECTOMY  2004  . PHOTOCOAGULATION WITH LASER Right 10/07/2015   Procedure: PHOTOCOAGULATION WITH LASER RIGHT EYE TRANS SCLERAL;  Surgeon: Ronnell Freshwater, MD;  Location: [facility];  Service: Ophthalmology;  Laterality: Right;  . TONSILLECTOMY      His family history includes Asthma in his mother; Heart attack in his father; Parkinson's disease in his father.      Current Outpatient Prescriptions:  .  brimonidine (ALPHAGAN P) 0.1 % SOLN, Apply to eye. Reported on 07/11/2015, Disp: , Rfl:  .  colchicine 0.6 MG tablet, Take 0.6 mg by mouth daily., Disp: , Rfl:  .  cyanocobalamin (,VITAMIN B-12,) 1000 MCG/ML  injection, INJECT 1 ML (1,000 MCG TOTAL) INTO THE MUSCLE EVERY 30 (THIRTY) DAYS., Disp: 1 mL, Rfl: 4 .  dorzolamide-timolol (COSOPT) 22.3-6.8 MG/ML ophthalmic solution, Apply to eye., Disp: , Rfl:  .  doxazosin (CARDURA) 8 MG tablet, TAKE 1 TABLET (8 MG TOTAL) BY MOUTH DAILY. (Patient taking differently: TAKE 1/2 TABLET (8 MG TOTAL) BY MOUTH DAILY.), Disp: 30 tablet, Rfl: 5 .  finasteride (PROSCAR) 5 MG tablet, Take 1 tablet (5 mg total) by mouth daily. Reported on 07/11/2015, Disp: 30 tablet, Rfl: 12 .  folic acid (FOLVITE) 1 MG tablet, Take 1 mg by mouth daily., Disp: , Rfl:  .  latanoprost (XALATAN) 0.005 % ophthalmic solution, Apply 1 drop to eye daily., Disp: , Rfl:  .  levothyroxine (SYNTHROID, LEVOTHROID) 125 MCG tablet, TAKE 1 TABLET (125 MCG TOTAL) BY MOUTH DAILY., Disp: 30 tablet, Rfl: 12 .  methotrexate (RHEUMATREX) 10 MG tablet, Take 10 mg by mouth once a week. Caution: Chemotherapy. Protect from light., Disp: , Rfl:  .  sildenafil (REVATIO) 20 MG tablet, Take 3 to 5 tablets two hours before  intercouse on an empty stomach.  Do not take with nitrates., Disp: 50 tablet, Rfl: 3 .  valsartan (DIOVAN) 80 MG tablet, TAKE 1 TABLET (80 MG TOTAL) BY MOUTH DAILY., Disp: 30 tablet, Rfl: 12  Patient Care Team: Birdie Sons, MD as PCP - General (Family Medicine) Regino Schultze, MD as Referring Physician (Dermatology) Ronnell Freshwater, MD as Referring Physician (Ophthalmology)     Objective:   Vitals: BP 110/60 (BP Location: Left Arm, Patient Position: Sitting, Cuff Size: Large)   Pulse (!) 57   Temp 98.1 F (36.7 C) (Oral)   Resp 16   Ht 6' (1.829 m)   Wt 194 lb (88 kg)   SpO2 97%   BMI 26.31 kg/m   Physical Exam    General Appearance:    Alert, cooperative, no distress, appears stated age  Head:    Normocephalic, without obvious abnormality, atraumatic  Eyes:    PERRL, conjunctiva/corneas clear, EOM's intact, fundi    benign, both eyes       Ears:    Normal TM's and  external ear canals, both ears  Nose:   Nares normal, septum midline, mucosa normal, no drainage   or sinus tenderness. Mild congestion and clear discharge  Throat:   Lips, mucosa, and tongue normal; teeth and gums normal  Neck:   Supple, symmetrical, trachea midline, no adenopathy;       thyroid:  No enlargement/tenderness/nodules; no carotid   bruit or JVD  Back:     Symmetric, no curvature, ROM normal, no CVA tenderness  Lungs:     Clear to auscultation bilaterally, respirations unlabored  Chest wall:    No tenderness or deformity  Heart:    Regular rate and rhythm, S1 and S2 normal, no murmur, rub   or gallop  Abdomen:     Soft, non-tender, bowel sounds active all four quadrants,    no masses, no organomegaly  Genitalia:    deferred  Rectal:    deferred  Extremities:   Extremities normal, atraumatic, no cyanosis or edema  Pulses:   2+ and symmetric all extremities  Skin:   Skin color, texture, turgor normal, ring like rash in gluteal folds.   Lymph nodes:   Cervical, supraclavicular, and axillary nodes normal  Neurologic:   CNII-XII intact. Normal strength, sensation and reflexes      throughout     Activities of Daily Living In your present state of health, do you have any difficulty performing the following activities: 05/27/2016 10/07/2015  Hearing? N N  Vision? Y N  Difficulty concentrating or making decisions? N N  Walking or climbing stairs? N N  Dressing or bathing? N N  Doing errands, shopping? N -  Preparing Food and eating ? N -  Using the Toilet? N -  In the past six months, have you accidently leaked urine? N -  Do you have problems with loss of bowel control? N -  Managing your Medications? N -  Managing your Finances? N -  Housekeeping or managing your Housekeeping? N -  Some recent data might be hidden    Fall Risk Assessment Fall Risk  05/27/2016 04/08/2015  Falls in the past year? No No     Depression Screen PHQ 2/9 Scores 05/27/2016 04/08/2015  PHQ  - 2 Score 0 0  PHQ- 9 Score - 1    Cognitive Testing - 6-CIT  Correct? Score   What year is it? yes 0 0 or 4  What month  is it? yes 0 0 or 3  Memorize:    Jacob Nielsen,  44,  [address]  [place],      What time is it? (within 1 hour) yes 0 0 or 3  Count backwards from 20 yes 0 0, 2, or 4  Name the months of the year yes 2 0, 2, or 4  Repeat name & address above yes 2 0, 2, 4, 6, 8, or 10       TOTAL SCORE  4/28   Interpretation:  Normal  Normal (0-7) Abnormal (8-28)    Audit-C Alcohol Use Screening  Question Answer Points  How often do you have alcoholic drink? never 0  On days you do drink alcohol, how many drinks do you typically consume? 0 0  How oftey will you drink 6 or more in a total? never 0  Total Score:  0   A score of 3 or more in women, and 4 or more in men indicates increased risk for alcohol abuse, EXCEPT if all of the points are from question 1.     Assessment & Plan:     Annual physical  Reviewed patient's Family Medical History Reviewed and updated list of patient's medical providers Assessment of cognitive impairment was done Assessed patient's functional ability Established a written schedule for health screening Jacob Completed and Reviewed  Exercise Activities and Dietary recommendations Goals    . Increase water intake          Starting 05/27/16, I will increase my water intake to 5 glasses a day.       Immunization History  Administered Date(s) Administered  . Influenza, High Dose Seasonal PF 03/08/2015, 03/07/2016  . Influenza-Unspecified 01/30/2014  . Pneumococcal Conjugate-13 02/14/2014  . Pneumococcal Polysaccharide-23 06/01/2008  . Tdap 10/18/2012  . Zoster 09/11/2014    Health Maintenance  Topic Date Due  . Samul Nielsen  10/19/2022  . INFLUENZA VACCINE  Completed  . PNA vac Low Risk Adult  Completed     Discussed health benefits of physical activity, and encouraged him to engage in regular  exercise appropriate for his age and condition.    ------------------------------------------------------------------------------------------------------------ 1. Annual physical exam   2. Upper respiratory tract infection, unspecified type Is nearly resolved over the last couple of days. Counseled regarding signs and symptoms of viral and bacterial respiratory infections. Advised to call or return for additional evaluation if he develops any sign of bacterial infection, or if current symptoms last longer than 10 days.    3. Hypothyroidism, unspecified type  - TSH  4. Osteopenia, unspecified location   5. Essential (primary) hypertension Well controlled.  Continue current medications.   - Renal function panel - Lipid panel - EKG 12-Lead  6. Chronic kidney disease (CKD), stage III (moderate)  - Renal function panel  7. Pernicious anemia  - CBC - Vitamin B12  8. Tinea corporis  - clotrimazole-betamethasone (LOTRISONE) cream; Apply 1 application topically 2 (two) times daily.  Dispense: 30 g; Refill: 5    Lelon Huh, MD  [facility]

## 2016-07-23 DIAGNOSIS — N183 Chronic kidney disease, stage 3 (moderate): Secondary | ICD-10-CM | POA: Diagnosis not present

## 2016-07-23 DIAGNOSIS — D510 Vitamin B12 deficiency anemia due to intrinsic factor deficiency: Secondary | ICD-10-CM | POA: Diagnosis not present

## 2016-07-23 DIAGNOSIS — E039 Hypothyroidism, unspecified: Secondary | ICD-10-CM | POA: Diagnosis not present

## 2016-07-23 DIAGNOSIS — I10 Essential (primary) hypertension: Secondary | ICD-10-CM | POA: Diagnosis not present

## 2016-07-24 LAB — CBC
HEMATOCRIT: 43 % (ref 37.5–51.0)
HEMOGLOBIN: 13.9 g/dL (ref 13.0–17.7)
MCH: 28.6 pg (ref 26.6–33.0)
MCHC: 32.3 g/dL (ref 31.5–35.7)
MCV: 89 fL (ref 79–97)
Platelets: 239 10*3/uL (ref 150–379)
RBC: 4.86 x10E6/uL (ref 4.14–5.80)
RDW: 16 % — AB (ref 12.3–15.4)
WBC: 7.5 10*3/uL (ref 3.4–10.8)

## 2016-07-24 LAB — LIPID PANEL
CHOL/HDL RATIO: 4.6 (ref 0.0–5.0)
Cholesterol, Total: 185 mg/dL (ref 100–199)
HDL: 40 mg/dL (ref >39)
LDL Calculated: 134 — ABNORMAL HIGH (ref 0–99)
Triglycerides: 57 mg/dL (ref 0–149)
VLDL Cholesterol Cal: 11 (ref 5–40)

## 2016-07-24 LAB — RENAL FUNCTION PANEL
Albumin: 4.6 g/dL (ref 3.5–4.7)
BUN / CREAT RATIO: 13 (ref 10–24)
BUN: 21 mg/dL (ref 8–27)
CHLORIDE: 103 mmol/L (ref 96–106)
CO2: 25 mmol/L (ref 18–29)
Calcium: 9.6 mg/dL (ref 8.6–10.2)
Creatinine, Ser: 1.61 mg/dL — ABNORMAL HIGH (ref 0.76–1.27)
GFR, EST AFRICAN AMERICAN: 46 — AB (ref >59)
GFR, EST NON AFRICAN AMERICAN: 40 — AB (ref >59)
GLUCOSE: 119 mg/dL — AB (ref 65–99)
POTASSIUM: 4.8 mmol/L (ref 3.5–5.2)
Phosphorus: 3.1 mg/dL (ref 2.5–4.5)
Sodium: 143 mmol/L (ref 134–144)

## 2016-07-24 LAB — VITAMIN B12: VITAMIN B 12: 885 pg/mL (ref 232–1245)

## 2016-07-24 LAB — TSH: TSH: 1.05 u[IU]/mL (ref 0.450–4.500)

## 2016-07-29 ENCOUNTER — Other Ambulatory Visit: Payer: Self-pay | Admitting: Family Medicine

## 2016-07-30 DIAGNOSIS — L308 Other specified dermatitis: Secondary | ICD-10-CM | POA: Diagnosis not present

## 2016-07-30 DIAGNOSIS — R21 Rash and other nonspecific skin eruption: Secondary | ICD-10-CM | POA: Diagnosis not present

## 2016-07-30 DIAGNOSIS — L570 Actinic keratosis: Secondary | ICD-10-CM | POA: Diagnosis not present

## 2016-08-05 DIAGNOSIS — H401133 Primary open-angle glaucoma, bilateral, severe stage: Secondary | ICD-10-CM | POA: Diagnosis not present

## 2016-09-08 ENCOUNTER — Other Ambulatory Visit: Payer: Self-pay | Admitting: Urology

## 2016-09-08 DIAGNOSIS — N401 Enlarged prostate with lower urinary tract symptoms: Secondary | ICD-10-CM

## 2016-09-14 ENCOUNTER — Other Ambulatory Visit: Payer: Self-pay | Admitting: Family Medicine

## 2016-10-10 ENCOUNTER — Other Ambulatory Visit: Payer: Self-pay | Admitting: Family Medicine

## 2016-10-20 ENCOUNTER — Ambulatory Visit (INDEPENDENT_AMBULATORY_CARE_PROVIDER_SITE_OTHER): Payer: PPO | Admitting: Family Medicine

## 2016-10-20 ENCOUNTER — Encounter: Payer: Self-pay | Admitting: Family Medicine

## 2016-10-20 VITALS — BP 120/70 | HR 69 | Temp 97.9°F | Resp 16 | Ht 72.0 in | Wt 195.0 lb

## 2016-10-20 DIAGNOSIS — L299 Pruritus, unspecified: Secondary | ICD-10-CM

## 2016-10-20 NOTE — Patient Instructions (Addendum)
   Stop finasteride and levothyroxine for one month to see how much this affects the itching.

## 2016-10-20 NOTE — Progress Notes (Signed)
Patient: Jacob VANHORN Sr. Male    DOB: [birth year]   83 y.o.   MRN: [id number] Visit Date: 10/20/2016  Today's Provider: Lelon Huh, MD   Chief Complaint  Patient presents with  . Hypertension    follow up  . Hypothyroidism    follow up  . Anemia    follow up  . Chronic Kidney Disease    follow up   Subjective:    HPI  Hypertension, follow-up:  BP Readings from Last 3 Encounters:  07/22/16 110/60  05/27/16 123/72  04/04/16 128/62    He was last seen for hypertension 3 months ago.  BP at that visit was 110/60. Management since that visit includes no changes. He reports good compliance with treatment. He is not having side effects.  He is exercising. He is adherent to low salt diet.   Outside blood pressures are 125-145 over 65-70. He is experiencing none.  Patient denies chest pain, chest pressure/discomfort, claudication, dyspnea, exertional chest pressure/discomfort, fatigue, irregular heart beat, lower extremity edema, near-syncope, orthopnea, palpitations, paroxysmal nocturnal dyspnea, syncope and tachypnea.   Cardiovascular risk factors include advanced age (older than 55 for men, 59 for women), hypertension and male gender.  Use of agents associated with hypertension: thyroid hormones.     Weight trend: stable Wt Readings from Last 3 Encounters:  07/22/16 194 lb (88 kg)  05/27/16 197 lb 4 oz (89.5 kg)  04/04/16 193 lb (87.5 kg)    Current diet: well balanced  ------------------------------------------------------------------------ Follow up of Hypothyroidism:  Patient was last seen for this problem 3 months ago and no changes were made. Patient reports good compliance with treatment and good tolerance.    States he stopped finasteride for about 4 months and itching improved, but did not improve.d  Was prescribed multiple medications by dermatology including cetirizine, hydroxychloroquine, methotrexate and doxepin. He thinks the itching is due  to some of his medications.  .      Allergies  Allergen Reactions  . Krystexxa  [Pegloticase] Anaphylaxis  . Allopurinol     Abdominal pain and itching  . Ciprofloxacin     causes mouth to itch  . Levofloxacin Other (See Comments)    Tendon pain  . Niacin     Other reaction(s): UNKNOWN  . Sulfa Antibiotics Itching  . Uloric  [Febuxostat]     Other reaction(s): Abdominal pain     Current Outpatient Prescriptions:  .  brimonidine (ALPHAGAN P) 0.1 % SOLN, Apply to eye. Reported on 07/11/2015, Disp: , Rfl:  .  clotrimazole-betamethasone (LOTRISONE) cream, Apply 1 application topically 2 (two) times daily., Disp: 30 g, Rfl: 5 .  colchicine 0.6 MG tablet, TAKE 1 TABLET BY MOUTH EVERY DAY AS NEEDED FOR GOUT, Disp: 30 tablet, Rfl: 5 .  cyanocobalamin (,VITAMIN B-12,) 1000 MCG/ML injection, INJECT 1 ML (1,000 MCG TOTAL) INTO THE MUSCLE EVERY 30 (THIRTY) DAYS., Disp: 1 mL, Rfl: 4 .  dorzolamide-timolol (COSOPT) 22.3-6.8 MG/ML ophthalmic solution, Apply to eye., Disp: , Rfl:  .  doxazosin (CARDURA) 8 MG tablet, TAKE 1 TABLET (8 MG TOTAL) BY MOUTH DAILY., Disp: 30 tablet, Rfl: 5 .  finasteride (PROSCAR) 5 MG tablet, TAKE 1 TABLET (5 MG TOTAL) BY MOUTH DAILY. REPORTED ON 07/11/2015, Disp: 30 tablet, Rfl: 4 .  folic acid (FOLVITE) 1 MG tablet, Take 1 mg by mouth daily., Disp: , Rfl:  .  latanoprost (XALATAN) 0.005 % ophthalmic solution, Apply 1 drop to eye daily., Disp: , Rfl:  .  levothyroxine (SYNTHROID, LEVOTHROID) 125 MCG tablet, TAKE 1 TABLET (125 MCG TOTAL) BY MOUTH DAILY., Disp: 30 tablet, Rfl: 12 .  methotrexate (RHEUMATREX) 10 MG tablet, Take 10 mg by mouth once a week. Caution: Chemotherapy. Protect from light., Disp: , Rfl:  .  sildenafil (REVATIO) 20 MG tablet, Take 3 to 5 tablets two hours before intercouse on an empty stomach.  Do not take with nitrates., Disp: 50 tablet, Rfl: 3 .  valsartan (DIOVAN) 80 MG tablet, TAKE 1 TABLET (80 MG TOTAL) BY MOUTH DAILY., Disp: 30 tablet, Rfl:  12  Review of Systems  Constitutional: Negative for appetite change, chills and fever.  Respiratory: Negative for chest tightness, shortness of breath and wheezing.   Cardiovascular: Negative for chest pain and palpitations.       Swelling in fingers  Gastrointestinal: Negative for abdominal pain, nausea and vomiting.  Skin:       Itchy skin from the waist up    Social History  Substance Use Topics  . Smoking status: Former Smoker    Packs/day: 1.50    Years: 20.00    Types: Cigarettes    Quit date: 06/01/1978  . Smokeless tobacco: Never Used  . Alcohol use No     Comment: former Alcoholic, meets with AA once a week. Has been alcohol free for 34 years   Objective:   BP 120/70 (BP Location: Left Arm, Patient Position: Sitting, Cuff Size: Large)   Pulse 69   Temp 97.9 F (36.6 C) (Oral)   Resp 16   Ht 6' (1.829 m)   Wt 195 lb (88.5 kg)   SpO2 96% Comment: room air  BMI 26.45 kg/m  There were no vitals filed for this visit.   Physical Exam   General Appearance:    Alert, cooperative, no distress  Eyes:    PERRL, conjunctiva/corneas clear, EOM's intact       Lungs:     Clear to auscultation bilaterally, respirations unlabored  Heart:    Regular rate and rhythm  Neurologic:   Awake, alert, oriented x 3. No apparent focal neurological           defect.           Assessment & Plan:     1. Pruritus Put levothyroxine and finasteride on hold for 3-4 weeks. Will contact patient to see if itching improves.        Lelon Huh, MD  [facility]

## 2016-10-29 DIAGNOSIS — R21 Rash and other nonspecific skin eruption: Secondary | ICD-10-CM | POA: Diagnosis not present

## 2016-11-11 ENCOUNTER — Other Ambulatory Visit: Payer: Self-pay | Admitting: Family Medicine

## 2016-11-19 DIAGNOSIS — R21 Rash and other nonspecific skin eruption: Secondary | ICD-10-CM | POA: Diagnosis not present

## 2016-11-30 ENCOUNTER — Other Ambulatory Visit: Payer: Self-pay | Admitting: Family Medicine

## 2016-11-30 NOTE — Telephone Encounter (Signed)
[facility] faxed refill request for 90 day supply of the following medications: doxazosin (CARDURA) 8 MG tablet 90 day supply [facility] [address]. Please advise. Thanks TNP

## 2016-12-03 DIAGNOSIS — H401113 Primary open-angle glaucoma, right eye, severe stage: Secondary | ICD-10-CM | POA: Diagnosis not present

## 2016-12-04 ENCOUNTER — Other Ambulatory Visit: Payer: Self-pay | Admitting: Urology

## 2016-12-04 DIAGNOSIS — N401 Enlarged prostate with lower urinary tract symptoms: Secondary | ICD-10-CM

## 2016-12-07 ENCOUNTER — Encounter: Payer: Self-pay | Admitting: Physician Assistant

## 2016-12-07 ENCOUNTER — Ambulatory Visit (INDEPENDENT_AMBULATORY_CARE_PROVIDER_SITE_OTHER): Payer: PPO | Admitting: Physician Assistant

## 2016-12-07 VITALS — BP 132/80 | HR 80 | Temp 98.8°F | Resp 16 | Wt 195.0 lb

## 2016-12-07 DIAGNOSIS — N183 Chronic kidney disease, stage 3 unspecified: Secondary | ICD-10-CM

## 2016-12-07 DIAGNOSIS — M109 Gout, unspecified: Secondary | ICD-10-CM | POA: Diagnosis not present

## 2016-12-07 DIAGNOSIS — M25421 Effusion, right elbow: Secondary | ICD-10-CM

## 2016-12-07 MED ORDER — DICLOFENAC SODIUM 50 MG PO TBEC: 50.0000 mg | DELAYED_RELEASE_TABLET | Freq: Three times a day (TID) | ORAL | 0 refills | Status: DC

## 2016-12-07 MED ORDER — DOXYCYCLINE HYCLATE 100 MG PO TABS: 100.0000 mg | ORAL_TABLET | Freq: Two times a day (BID) | ORAL | 0 refills | Status: AC

## 2016-12-07 MED ORDER — DICLOFENAC POTASSIUM 50 MG PO TABS: 50.0000 mg | ORAL_TABLET | Freq: Three times a day (TID) | ORAL | 0 refills | Status: AC

## 2016-12-07 NOTE — Progress Notes (Signed)
Patient: Jacob FAIRBANK Sr. Male    DOB: 10/31/1933   83 y.o.   MRN: [id number] Visit Date: 12/07/2016  Today's Provider: Trinna Post, PA-C   No chief complaint on file.  Subjective:    HPI   Kaire Stary is an 83 y/o man with history of gout with PRN colchicine, history of left sided olecranon bursitis, CKD III presenting today with right sided elbow pain ongoing for 5 days. He reports he was scooping ice cream on Wednesday for charity and had elbow pain after that. Additionally, his right elbow has swollen up and is painful to touch. He has also had fever and chills at night. No weakness. He has taken 0.6mg  colchicine three times without relief. He had also been on steroid taper of 40 mg daily prednisone x 1 week, 30 mg daily prednisone x 1 week etc which has irritated his stomach. He had taken 50 mg of his wife's diclofenac with relief of pain. History of evaluation by rheumatology with intolerance to uloric, allopurinol, and Krystexxa.   Elbow Pain: Patient complains of right elbow pain. Onset of the symptoms was several days ago.. Current symptoms include pain radiating to the tenderness, redness, swelling and fevers, chills. Pain is aggravated by: dipping ice cream. Patient's overall course: waxing and waning. Patient has had prior elbow problems. Evaluation to date: none Patient took 3 of the 0.6mg   colchicine but didn't help.     Allergies  Allergen Reactions  . Krystexxa  [Pegloticase] Anaphylaxis  . Allopurinol     Abdominal pain and itching  . Ciprofloxacin     causes mouth to itch  . Levofloxacin Other (See Comments)    Tendon pain  . Niacin     Other reaction(s): UNKNOWN  . Sulfa Antibiotics Itching  . Uloric  [Febuxostat]     Other reaction(s): Abdominal pain     Current Outpatient Prescriptions:  .  brimonidine (ALPHAGAN P) 0.1 % SOLN, Apply to eye. Reported on 07/11/2015, Disp: , Rfl:  .  clotrimazole-betamethasone (LOTRISONE) cream, Apply 1 application  topically 2 (two) times daily., Disp: 30 g, Rfl: 5 .  colchicine 0.6 MG tablet, TAKE 1 TABLET BY MOUTH EVERY DAY AS NEEDED FOR GOUT, Disp: 30 tablet, Rfl: 5 .  cyanocobalamin (,VITAMIN B-12,) 1000 MCG/ML injection, INJECT 1 ML (1,000 MCG TOTAL) INTO THE MUSCLE EVERY 30 (THIRTY) DAYS., Disp: 1 mL, Rfl: 4 .  dorzolamide-timolol (COSOPT) 22.3-6.8 MG/ML ophthalmic solution, Apply to eye., Disp: , Rfl:  .  finasteride (PROSCAR) 5 MG tablet, TAKE 1 TABLET (5 MG TOTAL) BY MOUTH DAILY. REPORTED ON 07/11/2015, Disp: 30 tablet, Rfl: 0 .  latanoprost (XALATAN) 0.005 % ophthalmic solution, Apply 1 drop to eye daily., Disp: , Rfl:  .  levothyroxine (SYNTHROID, LEVOTHROID) 125 MCG tablet, TAKE 1 TABLET (125 MCG TOTAL) BY MOUTH DAILY., Disp: 30 tablet, Rfl: 12 .  sildenafil (REVATIO) 20 MG tablet, Take 3 to 5 tablets two hours before intercouse on an empty stomach.  Do not take with nitrates., Disp: 50 tablet, Rfl: 3 .  valsartan (DIOVAN) 80 MG tablet, TAKE 1 TABLET (80 MG TOTAL) BY MOUTH DAILY., Disp: 30 tablet, Rfl: 12 .  doxazosin (CARDURA) 8 MG tablet, TAKE 1 TABLET (8 MG TOTAL) BY MOUTH DAILY. (Patient not taking: Reported on 10/20/2016), Disp: 30 tablet, Rfl: 5 .  methotrexate (RHEUMATREX) 10 MG tablet, Take 10 mg by mouth once a week. Caution: Chemotherapy. Protect from light., Disp: , Rfl:  Review of Systems  Cardiovascular: Negative for chest pain, palpitations and leg swelling.    Social History  Substance Use Topics  . Smoking status: Former Smoker    Packs/day: 1.50    Years: 20.00    Types: Cigarettes    Quit date: 06/01/1978  . Smokeless tobacco: Never Used  . Alcohol use No     Comment: former Alcoholic, meets with AA once a week. Has been alcohol free for 34 years   Objective:   BP 132/80 (BP Location: Left Arm, Patient Position: Sitting, Cuff Size: Normal)   Pulse 80   Temp 98.8 F (37.1 C) (Oral)   Resp 16   Wt 195 lb (88.5 kg)   BMI 26.45 kg/m     Physical Exam    Constitutional: He is oriented to person, place, and time. He appears well-developed and well-nourished.  Cardiovascular: Normal rate and regular rhythm.   Pulmonary/Chest: Effort normal and breath sounds normal.  Musculoskeletal: Normal range of motion. He exhibits edema and tenderness.  Neurological: He is alert and oriented to person, place, and time.  Skin: Skin is warm. Rash noted. There is erythema.  Marked swelling of right elbow with tenderness to palpation. Some slight warmth.   Psychiatric: He has a normal mood and affect. His behavior is normal.  Vitals reviewed.  Media Information     Document Information   Photos  Right elbow  12/07/2016 3:37 PM  Attached To:  Office Visit on 12/07/16 with Trinna Post, PA-C  Source Information   Paul[place] Floor  [place]:     1. Gout of right elbow, unspecified cause, unspecified chronicity  Acute on chronic flare of gout. Intolerance to steroids, colchicine not working. Consulted with Dr. Caryn Section. Explained that his kidney function is poor, so we must do shortest dose of diclofenac. Three times daily for three days. Will also cover with doxycycline because of warmth and fevers. Please call if not improving.  - diclofenac (CATAFLAM) 50 MG tablet; Take 1 tablet (50 mg total) by mouth 3 (three) times daily.  Dispense: 18 tablet; Refill: 0  2. Elbow swelling, right  - doxycycline (VIBRA-TABS) 100 MG tablet; Take 1 tablet (100 mg total) by mouth 2 (two) times daily.  Dispense: 14 tablet; Refill: 0  3. CKD III  Do not use diclofenac any longer than necessary.  Return if symptoms worsen or fail to improve.  The entirety of the information documented in the History of Present Illness, Review of Systems and Physical Exam were personally obtained by me. Portions of this information were initially documented by [facility], CMA and reviewed by me for thoroughness and accuracy.            Trinna Post, PA-C  [facility]

## 2016-12-07 NOTE — Patient Instructions (Signed)

## 2016-12-11 ENCOUNTER — Other Ambulatory Visit: Payer: PPO

## 2016-12-11 DIAGNOSIS — R972 Elevated prostate specific antigen [PSA]: Secondary | ICD-10-CM

## 2016-12-12 LAB — PSA: Prostate Specific Ag, Serum: 7.7 ng/mL — ABNORMAL HIGH (ref 0.0–4.0)

## 2016-12-14 NOTE — Progress Notes (Signed)
11:52 AM   Jacob Najjar Sr. September 19, 1933 [id number]  Referring provider: Birdie Sons, [address] [place], [place] [address]  Chief Complaint  Patient presents with  . Elevated PSA    1year    HPI: Patient is a 83 year old Caucasian male with a history of elevated PSA, ED and BPH with LUTS who presents today for a 12 month follow-up.     Elevated PSA Patient underwent a biopsy in December 2012 for a PSA of 6.0 while on finasteride. Biopsy returned negative.  Patient remained on finasteride and his PSA levels have remained under 6.  He had not been on his finasteride for the last 2 years and his PSA has returned at 10.2 ng/mL on 07/03/2015.  Finasteride was restarted and his PSA has continued to decrease. His most recent PSA was 7.7 ng/mL on 12/11/2016.  He has not been taken his finasteride due to it interfering with his erections.    BPH WITH LUTS His IPSS score today is 16, which is moderate lower urinary tract symptomatology. He is mixed with his quality life due to his urinary symptoms. He has had these symptoms for several years.  His previous I PSS score was 15/2.  He is currently taking doxazosin 8 mg daily and finasteride 5 mg daily. He denies any dysuria, hematuria or suprapubic pain.  He also denies any recent fevers, chills, nausea or vomiting.  He does not have a family history of PCa.      IPSS    Row Name 12/15/16 1100         International Prostate Symptom Score   How often have you had the sensation of not emptying your bladder? About half the time     How often have you had to urinate less than every two hours? Less than half the time     How often have you found you stopped and started again several times when you urinated? Less than half the time     How often have you found it difficult to postpone urination? Less than half the time     How often have you had a weak urinary stream? More than half the time     How often have you had to  strain to start urination? Less than half the time     How many times did you typically get up at night to urinate? 1 Time     Total IPSS Score 16       Quality of Life due to urinary symptoms   If you were to spend the rest of your life with your urinary condition just the way it is now how would you feel about that? Mixed        Score:  1-7 Mild 8-19 Moderate 20-35 Severe  Erectile dysfunction His SHIM score is 8, which is moderate ED.   He has been having difficulty with erections for one year.   His major complaint is lack of firmness.  His libido is preserved.   His risk factors for ED are age, BPH, HTN, HLD and CKD,   He denies any painful erections or curvatures with his erections.   He is still having spontaneous erections.  He has not tried PDE5-Inhibitors in the past.         [place] Name 12/15/16 1129         SHIM: Over the last 6 months:   How do  you rate your confidence that you could get and keep an erection? Low     When you had erections with sexual stimulation, how often were your erections hard enough for penetration (entering your partner)? A Few Times (much less than half the time)     During sexual intercourse, how often were you able to maintain your erection after you had penetrated (entered) your partner? A Few Times (much less than half the time)     During sexual intercourse, how difficult was it to maintain your erection to completion of intercourse? Extremely Difficult     When you attempted sexual intercourse, how often was it satisfactory for you? Almost Never or Never       SHIM Total Score   SHIM 8        Score: 1-7 Severe ED 8-11 Moderate ED 12-16 Mild-Moderate ED 17-21 Mild ED 22-25 No ED      PMH: Past Medical History:  Diagnosis Date  . Arthritis    right hand  . BPH (benign prostatic hyperplasia)   . ED (erectile dysfunction)   . Elevated PSA   . GERD (gastroesophageal reflux disease)    occasional  . Glaucoma   .  Gout   . Gout   . History of alcoholism ([place])   . History of chicken pox   . History of measles   . History of mumps   . History of pernicious anemia   . HTN (hypertension)    controlled  . Hypothyroidism   . Wears partial dentures    upper and lower     Surgical History: Past Surgical History:  Procedure Laterality Date  . EYE SURGERY     laser eye surgery on right eye for glaucoma  . PARATHYROIDECTOMY  2004  . PHOTOCOAGULATION WITH LASER Right 10/07/2015   Procedure: PHOTOCOAGULATION WITH LASER RIGHT EYE TRANS SCLERAL;  Surgeon: Ronnell Freshwater, MD;  Location: [facility];  Service: Ophthalmology;  Laterality: Right;  . TONSILLECTOMY      Home Medications:  Allergies as of 12/15/2016      Reactions   Krystexxa  [pegloticase] Anaphylaxis   Allopurinol    Abdominal pain and itching   Ciprofloxacin    causes mouth to itch   Levofloxacin Other (See Comments)   Tendon pain   Niacin    Other reaction(s): UNKNOWN   Sulfa Antibiotics Itching   Uloric  [febuxostat]    Other reaction(s): Abdominal pain      Medication List       Accurate as of 12/15/16 11:52 AM. Always use your most recent med list.          brimonidine 0.1 % Soln Commonly known as:  ALPHAGAN P Apply to eye. Reported on 07/11/2015   clotrimazole-betamethasone cream Commonly known as:  LOTRISONE Apply 1 application topically 2 (two) times daily.   colchicine 0.6 MG tablet TAKE 1 TABLET BY MOUTH EVERY DAY AS NEEDED FOR GOUT   cyanocobalamin 1000 MCG/ML injection Commonly known as:  (VITAMIN B-12) INJECT 1 ML (1,000 MCG TOTAL) INTO THE MUSCLE EVERY 30 (THIRTY) DAYS.   dorzolamide-timolol 22.3-6.8 MG/ML ophthalmic solution Commonly known as:  COSOPT Apply to eye.   doxazosin 8 MG tablet Commonly known as:  CARDURA TAKE 1 TABLET (8 MG TOTAL) BY MOUTH DAILY.   finasteride 5 MG tablet Commonly known as:  PROSCAR TAKE 1 TABLET (5 MG TOTAL) BY MOUTH DAILY. REPORTED ON 07/11/2015     hydroxychloroquine 200 MG tablet Commonly known  as:  PLAQUENIL TAKE 1 TABLET (200 MG TOTAL) BY MOUTH TWO (2) TIMES A DAY.   latanoprost 0.005 % ophthalmic solution Commonly known as:  XALATAN Apply 1 drop to eye daily.   levothyroxine 125 MCG tablet Commonly known as:  SYNTHROID, LEVOTHROID TAKE 1 TABLET (125 MCG TOTAL) BY MOUTH DAILY.   methotrexate 10 MG tablet Commonly known as:  RHEUMATREX Take 10 mg by mouth once a week. Caution: Chemotherapy. Protect from light.   sildenafil 20 MG tablet Commonly known as:  REVATIO Take 3 to 5 tablets two hours before intercouse on an empty stomach.  Do not take with nitrates.   valsartan 80 MG tablet Commonly known as:  DIOVAN TAKE 1 TABLET (80 MG TOTAL) BY MOUTH DAILY.       Allergies:  Allergies  Allergen Reactions  . Krystexxa  [Pegloticase] Anaphylaxis  . Allopurinol     Abdominal pain and itching  . Ciprofloxacin     causes mouth to itch  . Levofloxacin Other (See Comments)    Tendon pain  . Niacin     Other reaction(s): UNKNOWN  . Sulfa Antibiotics Itching  . Uloric  [Febuxostat]     Other reaction(s): Abdominal pain    Family History: Family History  Problem Relation Age of Onset  . Asthma Mother   . Heart attack Father   . Parkinson's disease Father   . Kidney disease Neg Hx   . Prostate cancer Neg Hx        maybe paternal grandfather ? unsure    Social History:  reports that he quit smoking about 38 years ago. His smoking use included Cigarettes. He has a 30.00 pack-year smoking history. He has never used smokeless tobacco. He reports that he does not drink alcohol or use drugs.  ROS: UROLOGY Frequent Urination?: Yes Hard to postpone urination?: No Burning/pain with urination?: Yes Get up at night to urinate?: No Leakage of urine?: No Urine stream starts and stops?: Yes Trouble starting stream?: No Do you have to strain to urinate?: No Blood in urine?: No Urinary tract infection?: No Sexually  transmitted disease?: No Injury to kidneys or bladder?: No Painful intercourse?: No Weak stream?: No Erection problems?: No Penile pain?: No  Gastrointestinal Nausea?: No Vomiting?: No Indigestion/heartburn?: No Diarrhea?: No Constipation?: No  Constitutional Fever: No Night sweats?: No Weight loss?: No Fatigue?: No  Skin Skin rash/lesions?: No Itching?: Yes  Eyes Blurred vision?: No Double vision?: No  Ears/Nose/Throat Sore throat?: No Sinus problems?: No  Hematologic/Lymphatic Swollen glands?: No Easy bruising?: No  Cardiovascular Leg swelling?: No Chest pain?: No  Respiratory Cough?: No Shortness of breath?: No  Endocrine Excessive thirst?: No  Musculoskeletal Back pain?: No Joint pain?: No  Neurological Headaches?: No Dizziness?: No  Psychologic Depression?: No Anxiety?: No  Physical Exam: BP (!) 142/88   Pulse 78   Ht 6' (1.829 m)   Wt 195 lb (88.5 kg)   BMI 26.45 kg/m   Constitutional: Well nourished. Alert and oriented, No acute distress. HEENT: [place] AT, moist mucus membranes. Trachea midline, no masses. Cardiovascular: No clubbing, cyanosis, or edema. Respiratory: Normal respiratory effort, no increased work of breathing. GI: Abdomen is soft, non tender, non distended, no abdominal masses. Liver and spleen not palpable.  No hernias appreciated.  Stool sample for occult testing is not indicated.   GU: No CVA tenderness.  No bladder fullness or masses.  Patient with circumcised phallus.   Urethral meatus is patent.  No penile discharge. No penile  lesions or rashes. Scrotum without lesions, cysts, rashes and/or edema.  Testicles are located scrotally bilaterally. No masses are appreciated in the testicles. Left and right epididymis are normal. Rectal: Patient with  normal sphincter tone. Anus and perineum without scarring or rashes. No rectal masses are appreciated. Prostate is approximately 55 grams, no nodules are appreciated. Seminal  vesicles are normal. Skin: No rashes, bruises or suspicious lesions. Lymph: No cervical or inguinal adenopathy. Neurologic: Grossly intact, no focal deficits, moving all 4 extremities. Psychiatric: Normal mood and affect.  Laboratory Data: PSA History  6.0 ng/mL on 05/12/2011-biopsy negative-on finasteride  3.6 ng/mL on 10/13/2011-on finasteride  5.4 ng/mL on 04/13/2012-on finasteride  3.2 ng/mL on 10/11/2012-on finasteride  3.6 ng/mL on 04/13/2013-on finasteride           10.2 ng/mL on 07/03/2015-not on finasteride   9.4 ng/mL on 07/11/2015  6.4 ng/mL on 08/09/2015  5.0 ng/mL on 12/09/2015  7.7 ng/mL on 12/11/2016 - not on finasteride   Lab Results  Component Value Date   WBC 7.5 07/23/2016   HGB 13.9 07/23/2016   HCT 43.0 07/23/2016   MCV 89 07/23/2016   PLT 239 07/23/2016   Lab Results  Component Value Date   CREATININE 1.61 (H) 07/23/2016   Lab Results  Component Value Date   TSH 1.050 07/23/2016      Component Value Date/Time   CHOL 185 07/23/2016 0812   HDL 40 07/23/2016 0812   CHOLHDL 4.6 07/23/2016 0812   LDLCALC 134 (H) 07/23/2016 0812   Lab Results  Component Value Date   AST 18 07/03/2015   Lab Results  Component Value Date   ALT 11 07/03/2015    Assessment & Plan:    1. Elevated PSA  - current PSA 7.7 - not taking finasteride  - will stop screening at this time   2. BPH with LUTS  - IPSS score is 15/2    - Continue doxazosin 8 mg daily- briefly discussed bladder outlet procedures  - RTC in 12 months for IPSS and exam   3. Erectile dysfunction:   - Refill given for sildenafil  - RTC in 12 months for repeat SHIM score and exam     Return in about 1 year (around 12/15/2017) for IPSS, SHIM and exam.  These notes generated with voice recognition software. I apologize for typographical errors.  Zara Council, [facility] [address], [place], [place] [address] [phone]

## 2016-12-15 ENCOUNTER — Ambulatory Visit: Payer: PPO | Admitting: Urology

## 2016-12-15 ENCOUNTER — Encounter: Payer: Self-pay | Admitting: Urology

## 2016-12-15 VITALS — BP 142/88 | HR 78 | Ht 72.0 in | Wt 195.0 lb

## 2016-12-15 DIAGNOSIS — N401 Enlarged prostate with lower urinary tract symptoms: Secondary | ICD-10-CM

## 2016-12-15 DIAGNOSIS — N138 Other obstructive and reflux uropathy: Secondary | ICD-10-CM | POA: Diagnosis not present

## 2016-12-15 DIAGNOSIS — R972 Elevated prostate specific antigen [PSA]: Secondary | ICD-10-CM

## 2016-12-15 DIAGNOSIS — N529 Male erectile dysfunction, unspecified: Secondary | ICD-10-CM

## 2016-12-15 MED ORDER — SILDENAFIL CITRATE 20 MG PO TABS: ORAL_TABLET | ORAL | 3 refills | Status: DC

## 2016-12-18 ENCOUNTER — Other Ambulatory Visit: Payer: Self-pay | Admitting: Family Medicine

## 2016-12-18 MED ORDER — DOXAZOSIN MESYLATE 8 MG PO TABS: ORAL_TABLET | ORAL | 4 refills | Status: DC

## 2016-12-18 NOTE — Telephone Encounter (Signed)
[facility] the following [facility]. Thanks CC  doxazosin (CARDURA) 8 MG tablet  >Take 1 tablet ( 8 MG Total ) by mouth daily.

## 2017-01-04 ENCOUNTER — Telehealth: Payer: Self-pay | Admitting: Family Medicine

## 2017-01-04 ENCOUNTER — Other Ambulatory Visit: Payer: Self-pay

## 2017-01-04 DIAGNOSIS — N401 Enlarged prostate with lower urinary tract symptoms: Secondary | ICD-10-CM

## 2017-01-04 MED ORDER — FINASTERIDE 5 MG PO TABS: 5.0000 mg | ORAL_TABLET | Freq: Every day | ORAL | 1 refills | Status: DC

## 2017-01-04 NOTE — Telephone Encounter (Signed)
Pt wife called to request an appointment for Jacob Nielsen for tomorrow afternoon.  Jacob Nielsen has an appointment at 1:45 tomorrow and she is asking if Jacob Nielsen can be seen when she is seen or after her appointment.  He has a itchy rash all over his body.  CB#[id number]/MW

## 2017-01-04 NOTE — Telephone Encounter (Signed)
I called and spoke with patients wife Jacob Nielsen. Appointment has been scheduled for tomorrow at 1:30pm with Dr. Caryn Section.

## 2017-01-04 NOTE — Telephone Encounter (Signed)
Per Dr. Caryn Section, ok to work patient in at 1:30pm tomorrow. Tried calling patient. No answer. Please try calling patient to advise him of this and schedule appointment for 1:30pm.

## 2017-01-04 NOTE — Telephone Encounter (Signed)
Please review. Ok to work patient in?

## 2017-01-05 ENCOUNTER — Encounter: Payer: Self-pay | Admitting: Family Medicine

## 2017-01-05 ENCOUNTER — Ambulatory Visit (INDEPENDENT_AMBULATORY_CARE_PROVIDER_SITE_OTHER): Payer: PPO | Admitting: Family Medicine

## 2017-01-05 VITALS — BP 130/68 | HR 62 | Temp 98.0°F | Resp 16 | Wt 195.0 lb

## 2017-01-05 DIAGNOSIS — B354 Tinea corporis: Secondary | ICD-10-CM | POA: Diagnosis not present

## 2017-01-05 DIAGNOSIS — L509 Urticaria, unspecified: Secondary | ICD-10-CM | POA: Diagnosis not present

## 2017-01-05 MED ORDER — CLOTRIMAZOLE-BETAMETHASONE 1-0.05 % EX CREA: 1.0000 "application " | TOPICAL_CREAM | Freq: Two times a day (BID) | CUTANEOUS | 2 refills | Status: DC

## 2017-01-05 MED ORDER — HYDROXYZINE HCL 10 MG PO TABS: 10.0000 mg | ORAL_TABLET | Freq: Three times a day (TID) | ORAL | 0 refills | Status: DC | PRN

## 2017-01-05 NOTE — Progress Notes (Signed)
Patient: Jacob KIMMONS Sr. Male    DOB: 1934/02/07   83 y.o.   MRN: [id number] Visit Date: 01/05/2017  Today's Provider: Lelon Huh, MD   Chief Complaint  Patient presents with  . Pruritis   Subjective:    HPI Patient presents today c/o itching all over his body. He reports that he has had this for years. It seemed to worsen over the last week. He reports that his skin gets really red and irritated when he scratches excessively. Patient normally has this treated by his dermatologist, but no therapies have been effective and he can not be seen until 8/23. He is currently taking Benadryl, using Triamcinolone cream, and lotrisone cream to help with his symptoms. He states that it doesn't seem to be helping. He states he has noticed itching gets much worse after some meals, but has not been able to identify any specific food triggers.         Allergies  Allergen Reactions  . Krystexxa  [Pegloticase] Anaphylaxis  . Allopurinol     Abdominal pain and itching  . Ciprofloxacin     causes mouth to itch  . Levofloxacin Other (See Comments)    Tendon pain  . Niacin     Other reaction(s): UNKNOWN  . Sulfa Antibiotics Itching  . Uloric  [Febuxostat]     Other reaction(s): Abdominal pain     Current Outpatient Prescriptions:  .  brimonidine (ALPHAGAN P) 0.1 % SOLN, Apply to eye. Reported on 07/11/2015, Disp: , Rfl:  .  clotrimazole-betamethasone (LOTRISONE) cream, Apply 1 application topically 2 (two) times daily., Disp: 30 g, Rfl: 5 .  colchicine 0.6 MG tablet, TAKE 1 TABLET BY MOUTH EVERY DAY AS NEEDED FOR GOUT, Disp: 30 tablet, Rfl: 5 .  cyanocobalamin (,VITAMIN B-12,) 1000 MCG/ML injection, INJECT 1 ML (1,000 MCG TOTAL) INTO THE MUSCLE EVERY 30 (THIRTY) DAYS., Disp: 1 mL, Rfl: 4 .  dorzolamide-timolol (COSOPT) 22.3-6.8 MG/ML ophthalmic solution, Apply to eye., Disp: , Rfl:  .  doxazosin (CARDURA) 8 MG tablet, TAKE 1 TABLET (8 MG TOTAL) BY MOUTH DAILY., Disp: 90 tablet, Rfl: 4 .   finasteride (PROSCAR) 5 MG tablet, Take 1 tablet (5 mg total) by mouth daily. Reported on 07/11/2015, Disp: 90 tablet, Rfl: 1 .  hydroxychloroquine (PLAQUENIL) 200 MG tablet, TAKE 1 TABLET (200 MG TOTAL) BY MOUTH TWO (2) TIMES A DAY., Disp: , Rfl: 2 .  latanoprost (XALATAN) 0.005 % ophthalmic solution, Apply 1 drop to eye daily., Disp: , Rfl:  .  levothyroxine (SYNTHROID, LEVOTHROID) 125 MCG tablet, TAKE 1 TABLET (125 MCG TOTAL) BY MOUTH DAILY., Disp: 30 tablet, Rfl: 12 .  methotrexate (RHEUMATREX) 10 MG tablet, Take 10 mg by mouth once a week. Caution: Chemotherapy. Protect from light., Disp: , Rfl:  .  sildenafil (REVATIO) 20 MG tablet, Take 3 to 5 tablets two hours before intercouse on an empty stomach.  Do not take with nitrates., Disp: 50 tablet, Rfl: 3 .  valsartan (DIOVAN) 80 MG tablet, TAKE 1 TABLET (80 MG TOTAL) BY MOUTH DAILY., Disp: 30 tablet, Rfl: 12  Review of Systems  Constitutional: Negative.   Skin: Positive for color change.       Dryness and red patches  Allergic/Immunologic: Negative.     Social History  Substance Use Topics  . Smoking status: Former Smoker    Packs/day: 1.50    Years: 20.00    Types: Cigarettes    Quit date: 06/01/1978  . Smokeless  tobacco: Never Used  . Alcohol use No     Comment: former Alcoholic, meets with AA once a week. Has been alcohol free for 34 years   Objective:   BP 130/68 (BP Location: Left Arm, Patient Position: Sitting, Cuff Size: Normal)   Pulse 62   Temp 98 F (36.7 C)   Resp 16   Wt 195 lb (88.5 kg)   SpO2 98%   BMI 26.45 kg/m     Physical Exam  General appearance: alert, well developed, well nourished, cooperative and in no distress Head: Normocephalic, without obvious abnormality, atraumatic Respiratory: Respirations even and unlabored, normal respiratory rate Extremities: No gross deformities Skin: Skin color, texture, turgor normal. No rashes seen  Psych: Appropriate mood and affect.      Assessment & Plan:       1. Urticaria Possible secondary to foods as symptoms are worse after eating meals.  - Food Allergy Profile  2. Tinea corporis Has 6cm ring lesion left lower back c/w previous episodes of ring worm. Needs refill.  - clotrimazole-betamethasone (LOTRISONE) cream; Apply 1 application topically 2 (two) times daily.  Dispense: 30 g; Refill: 2        Lelon Huh, MD  [facility]

## 2017-01-07 LAB — FOOD ALLERGY PROFILE
Allergen Corn, IgE: <0.1 kU/L
CLAM IGE: 0.11 kU/L — AB
EGG WHITE IGE: 0.44 kU/L — AB
MILK IGE: 0.72 kU/L — AB
SESAME SEED IGE: 0.11 kU/L — AB
Scallop IgE: <0.1 kU/L
Shrimp IgE: 0.28 kU/L — AB
Soybean IgE: <0.1 kU/L
Walnut IgE: <0.1 kU/L
Wheat IgE: 0.38 kU/L — AB

## 2017-01-15 ENCOUNTER — Telehealth: Payer: Self-pay | Admitting: Family Medicine

## 2017-01-15 MED ORDER — IRBESARTAN 150 MG PO TABS: 150.0000 mg | ORAL_TABLET | Freq: Every day | ORAL | 5 refills | Status: DC

## 2017-01-15 NOTE — Telephone Encounter (Signed)
[id number] 

## 2017-01-15 NOTE — Telephone Encounter (Signed)
Please review-aa 

## 2017-01-15 NOTE — Telephone Encounter (Signed)
Change to irbesartan, have sent prescription to his pharmacy

## 2017-01-15 NOTE — Telephone Encounter (Signed)
Pt is calling regarding the recall on his valsartin.  He wants to know what he needs to do.  His call back Is [phone]  Thank s Jacob Nielsen

## 2017-01-18 ENCOUNTER — Other Ambulatory Visit: Payer: Self-pay | Admitting: Physician Assistant

## 2017-01-18 DIAGNOSIS — M109 Gout, unspecified: Secondary | ICD-10-CM

## 2017-01-21 ENCOUNTER — Encounter: Payer: Self-pay | Admitting: Family Medicine

## 2017-01-21 ENCOUNTER — Ambulatory Visit (INDEPENDENT_AMBULATORY_CARE_PROVIDER_SITE_OTHER): Payer: PPO | Admitting: Family Medicine

## 2017-01-21 DIAGNOSIS — M109 Gout, unspecified: Secondary | ICD-10-CM | POA: Diagnosis not present

## 2017-01-21 MED ORDER — DICLOFENAC SODIUM 50 MG PO TBEC: 50.0000 mg | DELAYED_RELEASE_TABLET | Freq: Three times a day (TID) | ORAL | 0 refills | Status: DC

## 2017-01-21 NOTE — Progress Notes (Signed)
Patient: Jacob HERITAGE Sr. Male    DOB: August 22, 1933   83 y.o.   MRN: [id number] Visit Date: 01/21/2017  Today's Provider: Lelon Huh, MD   Chief Complaint  Patient presents with  . Joint Pain   Subjective:    Patient has had right knee pain 5 days. Patient stated that he did yard work for 3 days prior, where he was on his knees a lot. Afterward patient had trouble walking and had some swelling in knee. Has been taking 600 mg ibuprofen to help with the pain, but not working well.    Knee Pain   The incident occurred 3 to 5 days ago (5 days). The incident occurred in the yard. The injury mechanism is unknown. The pain is present in the right knee and right leg. The quality of the pain is described as aching. The pain is at a severity of 8/10. The pain is severe. The pain has been intermittent since onset. Pertinent negatives include no inability to bear weight. He reports no foreign bodies present. Nothing aggravates the symptoms. He has tried NSAIDs for the symptoms. The treatment provided mild relief.       Allergies  Allergen Reactions  . Krystexxa  [Pegloticase] Anaphylaxis  . Allopurinol     Abdominal pain and itching  . Ciprofloxacin     causes mouth to itch  . Levofloxacin Other (See Comments)    Tendon pain  . Niacin     Other reaction(s): UNKNOWN  . Sulfa Antibiotics Itching  . Uloric  [Febuxostat]     Other reaction(s): Abdominal pain     Current Outpatient Prescriptions:  .  brimonidine (ALPHAGAN P) 0.1 % SOLN, Apply to eye. Reported on 07/11/2015, Disp: , Rfl:  .  clotrimazole-betamethasone (LOTRISONE) cream, Apply 1 application topically 2 (two) times daily., Disp: 30 g, Rfl: 2 .  colchicine 0.6 MG tablet, TAKE 1 TABLET BY MOUTH EVERY DAY AS NEEDED FOR GOUT, Disp: 30 tablet, Rfl: 5 .  cyanocobalamin (,VITAMIN B-12,) 1000 MCG/ML injection, INJECT 1 ML (1,000 MCG TOTAL) INTO THE MUSCLE EVERY 30 (THIRTY) DAYS., Disp: 1 mL, Rfl: 4 .  dorzolamide-timolol  (COSOPT) 22.3-6.8 MG/ML ophthalmic solution, Apply to eye., Disp: , Rfl:  .  doxazosin (CARDURA) 8 MG tablet, TAKE 1 TABLET (8 MG TOTAL) BY MOUTH DAILY., Disp: 90 tablet, Rfl: 4 .  finasteride (PROSCAR) 5 MG tablet, Take 1 tablet (5 mg total) by mouth daily. Reported on 07/11/2015, Disp: 90 tablet, Rfl: 1 .  hydroxychloroquine (PLAQUENIL) 200 MG tablet, TAKE 1 TABLET (200 MG TOTAL) BY MOUTH TWO (2) TIMES A DAY., Disp: , Rfl: 2 .  hydrOXYzine (ATARAX/VISTARIL) 10 MG tablet, Take 1-2 tablets (10-20 mg total) by mouth 3 (three) times daily as needed., Disp: 30 tablet, Rfl: 0 .  irbesartan (AVAPRO) 150 MG tablet, Take 1 tablet (150 mg total) by mouth daily., Disp: 30 tablet, Rfl: 5 .  latanoprost (XALATAN) 0.005 % ophthalmic solution, Apply 1 drop to eye daily., Disp: , Rfl:  .  levothyroxine (SYNTHROID, LEVOTHROID) 125 MCG tablet, TAKE 1 TABLET (125 MCG TOTAL) BY MOUTH DAILY., Disp: 30 tablet, Rfl: 12 .  methotrexate (RHEUMATREX) 10 MG tablet, Take 10 mg by mouth once a week. Caution: Chemotherapy. Protect from light., Disp: , Rfl:  .  sildenafil (REVATIO) 20 MG tablet, Take 3 to 5 tablets two hours before intercouse on an empty stomach.  Do not take with nitrates., Disp: 50 tablet, Rfl: 3  Review of  Systems  Constitutional: Negative for appetite change, chills and fever.  Respiratory: Negative for chest tightness, shortness of breath and wheezing.   Cardiovascular: Negative for chest pain and palpitations.  Gastrointestinal: Negative for abdominal pain, nausea and vomiting.  Musculoskeletal: Positive for arthralgias.    Social History  Substance Use Topics  . Smoking status: Former Smoker    Packs/day: 1.50    Years: 20.00    Types: Cigarettes    Quit date: 06/01/1978  . Smokeless tobacco: Never Used  . Alcohol use No     Comment: former Alcoholic, meets with AA once a week. Has been alcohol free for 34 years   Objective:   BP 120/70 (BP Location: Left Arm, Patient Position: Sitting, Cuff  Size: Normal)   Pulse (!) 59   Temp 97.7 F (36.5 C) (Oral)   Resp 16   Wt 196 lb (88.9 kg)   SpO2 96%   BMI 26.58 kg/m  Vitals:   01/21/17 1119  BP: 120/70  Pulse: (!) 59  Resp: 16  Temp: 97.7 F (36.5 C)  TempSrc: Oral  SpO2: 96%  Weight: 196 lb (88.9 kg)     Physical Exam  General appearance: alert, well developed, well nourished, cooperative and in no distress Head: Normocephalic, without obvious abnormality, atraumatic Respiratory: Respirations even and unlabored, normal respiratory rate Extremities: Tender along right MCL. No swelling. Negative Mcmurray's sign. Difficulty bearing weight.      Assessment & Plan:     1. Gout of right elbow, unspecified cause, unspecified chronicity Suspect MCL strain possible mild meniscus disease. He also states it feels like prior episodes of gout. Will chang form ibuprofen to - diclofenac (VOLTAREN) 50 MG EC tablet; Take 1 tablet (50 mg total) by mouth 3 (three) times daily. As needed for knee pain  Dispense: 30 tablet; Refill: 0  Apply ice frequently and wear knee brace. Consider orthopedic referral if not much better in a week.       Lelon Huh, MD  [facility]

## 2017-01-21 NOTE — Patient Instructions (Signed)
   Apply ice to knee for about 10 minutes three a day for the next 5 days

## 2017-01-21 NOTE — Telephone Encounter (Signed)
Pt has appt today and will be notified.

## 2017-01-26 ENCOUNTER — Encounter: Payer: Self-pay | Admitting: Family Medicine

## 2017-01-26 ENCOUNTER — Ambulatory Visit (INDEPENDENT_AMBULATORY_CARE_PROVIDER_SITE_OTHER): Payer: PPO | Admitting: Family Medicine

## 2017-01-26 VITALS — BP 120/70 | HR 77 | Temp 98.1°F | Resp 16 | Wt 194.0 lb

## 2017-01-26 DIAGNOSIS — L299 Pruritus, unspecified: Secondary | ICD-10-CM

## 2017-01-26 NOTE — Progress Notes (Signed)
Patient: Jacob DARNELL Sr. Male    DOB: 02/08/1934   83 y.o.   MRN: [id number] Visit Date: 01/26/2017  Today's Provider: Lelon Huh, MD   Chief Complaint  Patient presents with  . Pruritis   Subjective:    Patient has recurrent itching all over his body. No rash. Patient stated that he put valsartan on hold 2 days ago thinking this may be the reason for itching but there is no change. Also pt has a couple spots of ring ring worm on his left leg.   He was seen 01/21/2017 for elbow pain and prescribed diclofenac. At that time his itching had mostly resolved since making dietary changes. He took some diclofenac over the weekend, last dose was two days ago. Itching is worse across chest and stomach.   Of note is that he was changed from valsartan to irbesartan on 01/15/2017. He switched back to valsartan on 01/18/2017 because he felt irbesartan was causing some abdominal pains. He stopped valsartan about 3 days ago due to itching coming back.   He also states he stopped taking Plaquenil a few days ago. He was also prescribed hydroxyzine at the beginning of the month but states it hasn't helped at all, so he stopped taking it.      Allergies  Allergen Reactions  . Krystexxa  [Pegloticase] Anaphylaxis  . Allopurinol     Abdominal pain and itching  . Ciprofloxacin     causes mouth to itch  . Levofloxacin Other (See Comments)    Tendon pain  . Niacin     Other reaction(s): UNKNOWN  . Sulfa Antibiotics Itching  . Uloric  [Febuxostat]     Other reaction(s): Abdominal pain     Current Outpatient Prescriptions:  .  brimonidine (ALPHAGAN P) 0.1 % SOLN, Apply to eye. Reported on 07/11/2015, Disp: , Rfl:  .  clotrimazole-betamethasone (LOTRISONE) cream, Apply 1 application topically 2 (two) times daily., Disp: 30 g, Rfl: 2 .  colchicine 0.6 MG tablet, TAKE 1 TABLET BY MOUTH EVERY DAY AS NEEDED FOR GOUT, Disp: 30 tablet, Rfl: 5 .  cyanocobalamin (,VITAMIN B-12,) 1000 MCG/ML injection,  INJECT 1 ML (1,000 MCG TOTAL) INTO THE MUSCLE EVERY 30 (THIRTY) DAYS., Disp: 1 mL, Rfl: 4 .  diclofenac (VOLTAREN) 50 MG EC tablet, Take 1 tablet (50 mg total) by mouth 3 (three) times daily. As needed for knee pain, Disp: 30 tablet, Rfl: 0 .  dorzolamide-timolol (COSOPT) 22.3-6.8 MG/ML ophthalmic solution, Apply to eye., Disp: , Rfl:  .  doxazosin (CARDURA) 8 MG tablet, TAKE 1 TABLET (8 MG TOTAL) BY MOUTH DAILY., Disp: 90 tablet, Rfl: 4 .  finasteride (PROSCAR) 5 MG tablet, Take 1 tablet (5 mg total) by mouth daily. Reported on 07/11/2015, Disp: 90 tablet, Rfl: 1 .  hydroxychloroquine (PLAQUENIL) 200 MG tablet, TAKE 1 TABLET (200 MG TOTAL) BY MOUTH TWO (2) TIMES A DAY., Disp: , Rfl: 2 .  latanoprost (XALATAN) 0.005 % ophthalmic solution, Apply 1 drop to eye daily., Disp: , Rfl:  .  levothyroxine (SYNTHROID, LEVOTHROID) 125 MCG tablet, TAKE 1 TABLET (125 MCG TOTAL) BY MOUTH DAILY., Disp: 30 tablet, Rfl: 12 .  sildenafil (REVATIO) 20 MG tablet, Take 3 to 5 tablets two hours before intercouse on an empty stomach.  Do not take with nitrates., Disp: 50 tablet, Rfl: 3 .  hydrOXYzine (ATARAX/VISTARIL) 10 MG tablet, Take 1-2 tablets (10-20 mg total) by mouth 3 (three) times daily as needed. (Patient not taking: Reported  on 01/26/2017), Disp: 30 tablet, Rfl: 0 .  methotrexate (RHEUMATREX) 10 MG tablet, Take 10 mg by mouth once a week. Caution: Chemotherapy. Protect from light., Disp: , Rfl:  .  valsartan (DIOVAN) 80 MG tablet, Take 80 mg by mouth daily., Disp: , Rfl:   Review of Systems  Constitutional: Negative for appetite change, chills and fever.  Respiratory: Negative for chest tightness, shortness of breath and wheezing.   Cardiovascular: Negative for chest pain and palpitations.  Gastrointestinal: Negative for abdominal pain, nausea and vomiting.  Skin: Positive for rash.    Social History  Substance Use Topics  . Smoking status: Former Smoker    Packs/day: 1.50    Years: 20.00    Types:  Cigarettes    Quit date: 06/01/1978  . Smokeless tobacco: Never Used  . Alcohol use No     Comment: former Alcoholic, meets with AA once a week. Has been alcohol free for 34 years   Objective:   BP 120/70 (BP Location: Left Arm, Patient Position: Sitting, Cuff Size: Normal)   Pulse 77   Temp 98.1 F (36.7 C) (Oral)   Resp 16   Wt 194 lb (88 kg)   SpO2 96%   BMI 26.31 kg/m     Physical Exam   General Appearance:    Alert, cooperative, no distress  Eyes:    PERRL, conjunctiva/corneas clear, EOM's intact       Lungs:     Clear to auscultation bilaterally, respirations unlabored  Heart:    Regular rate and rhythm  Skin:   Erythematous across lower chest and upper abdomen with excoriations.           Assessment & Plan:     1. Pruritus Advised to stay on dietary restrictions according to RAST testing. Restart Plaquenil previously prescribed by Dr. Marolyn Hammock. He states he has prednisone at home and is to start back on short taper  Patient Instructions  Start back on prednisone 10mg  tablets Start with 2 tablets twice a day for two days, then 2 tablets in the morning and one in the evening for two days then 1 tablet twice a day for 2 days then tablet daily          Lelon Huh, MD  [facility]

## 2017-01-26 NOTE — Patient Instructions (Addendum)
Start back on prednisone 10mg  tablets Start with 2 tablets twice a day for two days, then 2 tablets in the morning and one in the evening for two days then 1 tablet twice a day for 2 days then tablet daily

## 2017-02-19 ENCOUNTER — Encounter: Payer: Self-pay | Admitting: Family Medicine

## 2017-02-19 ENCOUNTER — Ambulatory Visit (INDEPENDENT_AMBULATORY_CARE_PROVIDER_SITE_OTHER): Payer: PPO | Admitting: Family Medicine

## 2017-02-19 VITALS — BP 132/72 | HR 74 | Temp 97.9°F | Resp 16 | Ht 72.0 in | Wt 190.0 lb

## 2017-02-19 DIAGNOSIS — M778 Other enthesopathies, not elsewhere classified: Secondary | ICD-10-CM | POA: Diagnosis not present

## 2017-02-19 MED ORDER — NAPROXEN 500 MG PO TBEC: 500.0000 mg | DELAYED_RELEASE_TABLET | Freq: Two times a day (BID) | ORAL | 0 refills | Status: AC

## 2017-02-19 NOTE — Progress Notes (Signed)
Patient: Jacob Nielsen Sr. Male    DOB: Sep 21, 1933   83 y.o.   MRN: [id number] Visit Date: 02/19/2017  Today's Provider: Lelon Huh, MD   Chief Complaint  Patient presents with  . Hand Pain   Subjective:    HPI Patient comes in today c/o right hand pain X 1 week. He thinks he injured it while doing laundry. He has been taking Ibuprofen 600mg  and Tylenol with minimal relief. He has also had swelling and redness.     Allergies  Allergen Reactions  . Krystexxa  [Pegloticase] Anaphylaxis  . Allopurinol     Abdominal pain and itching  . Ciprofloxacin     causes mouth to itch  . Levofloxacin Other (See Comments)    Tendon pain  . Niacin     Other reaction(s): UNKNOWN  . Sulfa Antibiotics Itching  . Uloric  [Febuxostat]     Other reaction(s): Abdominal pain     Current Outpatient Prescriptions:  .  brimonidine (ALPHAGAN P) 0.1 % SOLN, Apply to eye. Reported on 07/11/2015, Disp: , Rfl:  .  clotrimazole-betamethasone (LOTRISONE) cream, Apply 1 application topically 2 (two) times daily., Disp: 30 g, Rfl: 2 .  colchicine 0.6 MG tablet, TAKE 1 TABLET BY MOUTH EVERY DAY AS NEEDED FOR GOUT, Disp: 30 tablet, Rfl: 5 .  cyanocobalamin (,VITAMIN B-12,) 1000 MCG/ML injection, INJECT 1 ML (1,000 MCG TOTAL) INTO THE MUSCLE EVERY 30 (THIRTY) DAYS., Disp: 1 mL, Rfl: 4 .  dorzolamide-timolol (COSOPT) 22.3-6.8 MG/ML ophthalmic solution, Apply to eye., Disp: , Rfl:  .  doxazosin (CARDURA) 8 MG tablet, TAKE 1 TABLET (8 MG TOTAL) BY MOUTH DAILY., Disp: 90 tablet, Rfl: 4 .  finasteride (PROSCAR) 5 MG tablet, Take 1 tablet (5 mg total) by mouth daily. Reported on 07/11/2015, Disp: 90 tablet, Rfl: 1 .  hydroxychloroquine (PLAQUENIL) 200 MG tablet, TAKE 1 TABLET (200 MG TOTAL) BY MOUTH TWO (2) TIMES A DAY., Disp: , Rfl: 2 .  latanoprost (XALATAN) 0.005 % ophthalmic solution, Apply 1 drop to eye daily., Disp: , Rfl:  .  levothyroxine (SYNTHROID, LEVOTHROID) 125 MCG tablet, TAKE 1 TABLET (125 MCG  TOTAL) BY MOUTH DAILY., Disp: 30 tablet, Rfl: 12 .  methotrexate (RHEUMATREX) 10 MG tablet, Take 10 mg by mouth once a week. Caution: Chemotherapy. Protect from light., Disp: , Rfl:  .  sildenafil (REVATIO) 20 MG tablet, Take 3 to 5 tablets two hours before intercouse on an empty stomach.  Do not take with nitrates., Disp: 50 tablet, Rfl: 3 .  valsartan (DIOVAN) 80 MG tablet, Take 80 mg by mouth daily., Disp: , Rfl:  .  hydrOXYzine (ATARAX/VISTARIL) 10 MG tablet, Take 1-2 tablets (10-20 mg total) by mouth 3 (three) times daily as needed. (Patient not taking: Reported on 01/26/2017), Disp: 30 tablet, Rfl: 0   Review of Systems  Constitutional: Positive for activity change.  Cardiovascular: Negative.   Musculoskeletal: Positive for arthralgias, joint swelling and myalgias.  Neurological: Positive for weakness. Negative for numbness.    Social History  Substance Use Topics  . Smoking status: Former Smoker    Packs/day: 1.50    Years: 20.00    Types: Cigarettes    Quit date: 06/01/1978  . Smokeless tobacco: Never Used  . Alcohol use No     Comment: former Alcoholic, meets with AA once a week. Has been alcohol free for 34 years   Objective:   BP 132/72 (BP Location: Right Arm, Patient Position: Sitting, Cuff Size:  Normal)   Pulse 74   Temp 97.9 F (36.6 C)   Resp 16   Ht 6' (1.829 m)   Wt 190 lb (86.2 kg)   SpO2 98%   BMI 25.77 kg/m  Vitals:   02/19/17 1059  BP: 132/72  Pulse: 74  Resp: 16  Temp: 97.9 F (36.6 C)  SpO2: 98%  Weight: 190 lb (86.2 kg)  Height: 6' (1.829 m)     Physical Exam  General appearance: alert, well developed, well nourished, cooperative and in no distress Head: Normocephalic, without obvious abnormality, atraumatic Respiratory: Respirations even and unlabored, normal respiratory rate Extremities: Tender along right lateral forearm and hand. Pain with wrist extension no swelling.      Assessment & Plan:     1. Tendonitis of wrist, right  -  naproxen (EC NAPROSYN) 500 MG EC tablet; Take 1 tablet (500 mg total) by mouth 2 (two) times daily.  Dispense: 30 tablet; Refill: 0     The entirety of the information documented in the History of Present Illness, Review of Systems and Physical Exam were personally obtained by me. Portions of this information were initially documented by Wilburt Finlay, CMA and reviewed by me for thoroughness and accuracy.    Lelon Huh, MD  [facility]

## 2017-03-09 DIAGNOSIS — M1A00X0 Idiopathic chronic gout, unspecified site, without tophus (tophi): Secondary | ICD-10-CM | POA: Diagnosis not present

## 2017-03-09 DIAGNOSIS — M1990 Unspecified osteoarthritis, unspecified site: Secondary | ICD-10-CM | POA: Diagnosis not present

## 2017-03-09 DIAGNOSIS — M79641 Pain in right hand: Secondary | ICD-10-CM | POA: Diagnosis not present

## 2017-03-18 ENCOUNTER — Ambulatory Visit (INDEPENDENT_AMBULATORY_CARE_PROVIDER_SITE_OTHER): Payer: PPO | Admitting: Family Medicine

## 2017-03-18 DIAGNOSIS — Z23 Encounter for immunization: Secondary | ICD-10-CM

## 2017-03-23 DIAGNOSIS — M1A00X0 Idiopathic chronic gout, unspecified site, without tophus (tophi): Secondary | ICD-10-CM | POA: Diagnosis not present

## 2017-03-23 DIAGNOSIS — N183 Chronic kidney disease, stage 3 (moderate): Secondary | ICD-10-CM | POA: Diagnosis not present

## 2017-03-30 DIAGNOSIS — M1A00X0 Idiopathic chronic gout, unspecified site, without tophus (tophi): Secondary | ICD-10-CM | POA: Diagnosis not present

## 2017-03-30 DIAGNOSIS — N183 Chronic kidney disease, stage 3 (moderate): Secondary | ICD-10-CM | POA: Diagnosis not present

## 2017-04-01 DIAGNOSIS — B354 Tinea corporis: Secondary | ICD-10-CM | POA: Diagnosis not present

## 2017-04-01 DIAGNOSIS — R21 Rash and other nonspecific skin eruption: Secondary | ICD-10-CM | POA: Diagnosis not present

## 2017-04-01 DIAGNOSIS — L508 Other urticaria: Secondary | ICD-10-CM | POA: Diagnosis not present

## 2017-04-01 DIAGNOSIS — L509 Urticaria, unspecified: Secondary | ICD-10-CM | POA: Diagnosis not present

## 2017-04-01 DIAGNOSIS — Z79899 Other long term (current) drug therapy: Secondary | ICD-10-CM | POA: Diagnosis not present

## 2017-04-02 DIAGNOSIS — H401113 Primary open-angle glaucoma, right eye, severe stage: Secondary | ICD-10-CM | POA: Diagnosis not present

## 2017-04-09 DIAGNOSIS — H401133 Primary open-angle glaucoma, bilateral, severe stage: Secondary | ICD-10-CM | POA: Diagnosis not present

## 2017-04-20 ENCOUNTER — Other Ambulatory Visit: Payer: Self-pay | Admitting: Family Medicine

## 2017-04-20 MED ORDER — VALSARTAN 80 MG PO TABS: 80.0000 mg | ORAL_TABLET | Freq: Every day | ORAL | 4 refills | Status: DC

## 2017-04-20 MED ORDER — LEVOTHYROXINE SODIUM 125 MCG PO TABS: 125.0000 ug | ORAL_TABLET | Freq: Every day | ORAL | 4 refills | Status: DC

## 2017-04-20 NOTE — Telephone Encounter (Addendum)
[facility] faxed a refill request on the following medications:  valsartan (DIOVAN) 80 MG tablet.  90 day supply.  levothyroxine (SYNTHROID, LEVOTHROID) 125 MCG tablet.  90 day supply  [facility] [address] [facility] [address]

## 2017-04-29 DIAGNOSIS — R21 Rash and other nonspecific skin eruption: Secondary | ICD-10-CM | POA: Diagnosis not present

## 2017-04-29 DIAGNOSIS — L308 Other specified dermatitis: Secondary | ICD-10-CM | POA: Diagnosis not present

## 2017-04-29 DIAGNOSIS — Z79899 Other long term (current) drug therapy: Secondary | ICD-10-CM | POA: Diagnosis not present

## 2017-05-14 ENCOUNTER — Other Ambulatory Visit: Payer: Self-pay | Admitting: Family Medicine

## 2017-05-14 MED ORDER — IRBESARTAN 150 MG PO TABS: 150.0000 mg | ORAL_TABLET | Freq: Every day | ORAL | 3 refills | Status: DC

## 2017-05-14 NOTE — Progress Notes (Signed)
Change valsartan to irbesartan due to recall.

## 2017-05-31 ENCOUNTER — Other Ambulatory Visit: Payer: Self-pay | Admitting: Family Medicine

## 2017-06-03 ENCOUNTER — Other Ambulatory Visit
Admission: RE | Admit: 2017-06-03 | Discharge: 2017-06-03 | Disposition: A | Discharge status: Home / Self Care | Payer: PPO | Source: Ambulatory Visit | Attending: Rheumatology | Admitting: Rheumatology

## 2017-06-03 DIAGNOSIS — N183 Chronic kidney disease, stage 3 (moderate): Secondary | ICD-10-CM | POA: Insufficient documentation

## 2017-06-03 DIAGNOSIS — M7022 Olecranon bursitis, left elbow: Secondary | ICD-10-CM | POA: Diagnosis not present

## 2017-06-03 DIAGNOSIS — M1A00X0 Idiopathic chronic gout, unspecified site, without tophus (tophi): Secondary | ICD-10-CM | POA: Diagnosis not present

## 2017-06-03 LAB — SYNOVIAL CELL COUNT + DIFF, W/ CRYSTALS
Crystals, Fluid: NONE SEEN
Eosinophils-Synovial: 0 %
Lymphocytes-Synovial Fld: 0 %
Monocyte-Macrophage-Synovial Fluid: 8 %
NEUTROPHIL, SYNOVIAL: 92 %
WBC, Synovial: 5973 /mm3 — ABNORMAL HIGH (ref 0–200)

## 2017-06-06 LAB — BODY FLUID CULTURE: Culture: NO GROWTH

## 2017-06-10 DIAGNOSIS — Z79899 Other long term (current) drug therapy: Secondary | ICD-10-CM | POA: Diagnosis not present

## 2017-06-10 DIAGNOSIS — R21 Rash and other nonspecific skin eruption: Secondary | ICD-10-CM | POA: Diagnosis not present

## 2017-06-24 ENCOUNTER — Ambulatory Visit (INDEPENDENT_AMBULATORY_CARE_PROVIDER_SITE_OTHER): Payer: PPO

## 2017-06-24 VITALS — BP 114/72 | HR 68 | Temp 97.7°F | Ht 72.0 in | Wt 189.8 lb

## 2017-06-24 DIAGNOSIS — Z0000 Encounter for general adult medical examination without abnormal findings: Secondary | ICD-10-CM

## 2017-06-24 NOTE — Progress Notes (Signed)
Subjective:   Jacob ELBIE STATZER Sr. is a 84 y.o. male who presents for Medicare Annual/Subsequent preventive examination.  Review of Systems:  N/A  Cardiac Risk Factors include: advanced age (>70men, >20 women);male gender;dyslipidemia;hypertension     Objective:    Vitals: BP 114/72 (BP Location: Left Arm)   Pulse 68   Temp 97.7 F (36.5 C) (Oral)   Ht 6' (1.829 m)   Wt 189 lb 12.8 oz (86.1 kg)   BMI 25.74 kg/m   Body mass index is 25.74 kg/m.  Advanced Directives 06/24/2017 05/27/2016 10/07/2015  Does Patient Have a Medical Advance Directive? Yes Yes Yes  Type of Advance Directive Living will Living will;Healthcare Power of [place];Living will  Does patient want to make changes to medical advance directive? - - No - Patient declined  Copy of [place] in Chart? - Yes No - copy requested    Tobacco Social History   Tobacco Use  Smoking Status Former Smoker  . Packs/day: 1.50  . Years: 20.00  . Pack years: 30.00  . Types: Cigarettes  . Last attempt to quit: 06/01/1978  . Years since quitting: 39.0  Smokeless Tobacco Never Used     Counseling given: Not Answered   Clinical Intake:  Pre-visit preparation completed: Yes  Pain : No/denies pain Pain Score: 0-No pain     Nutritional Status: BMI 25 -29 Overweight Nutritional Risks: None Diabetes: No  How often do you need to have someone help you when you read instructions, pamphlets, or other written materials from your doctor or pharmacy?: 1 - Never  Interpreter Needed?: No  Information entered by :: [facility], LPN  Past Medical History:  Diagnosis Date  . Arthritis    right hand  . BPH (benign prostatic hyperplasia)   . ED (erectile dysfunction)   . Elevated PSA   . GERD (gastroesophageal reflux disease)    occasional  . Glaucoma   . Gout   . Gout   . History of alcoholism ([place])   . History of chicken pox   . History of measles   . History of mumps     . History of pernicious anemia   . HTN (hypertension)    controlled  . Hypothyroidism   . Wears partial dentures    upper and lower    Past Surgical History:  Procedure Laterality Date  . EYE SURGERY     laser eye surgery on right eye for glaucoma  . PARATHYROIDECTOMY  2004  . PHOTOCOAGULATION WITH LASER Right 10/07/2015   Procedure: PHOTOCOAGULATION WITH LASER RIGHT EYE TRANS SCLERAL;  Surgeon: Ronnell Freshwater, MD;  Location: [facility];  Service: Ophthalmology;  Laterality: Right;  . TONSILLECTOMY     Family History  Problem Relation Age of Onset  . Asthma Mother   . Heart attack Father   . Parkinson's disease Father   . Kidney disease Neg Hx   . Prostate cancer Neg Hx        maybe paternal grandfather ? unsure   Social History   Socioeconomic History  . Marital status: Married    Spouse name: Jacob Nielsen  . Number of children: 2  . Years of education: None  . Highest education level: Some college, no degree  Social Needs  . Financial resource strain: Not hard at all  . Food insecurity - worry: Never true  . Food insecurity - inability: Never true  . Transportation needs - medical: No  .  Transportation needs - non-medical: No  Occupational History  . Occupation: Retired    Comment: Previously worked at a [employer]  . Smoking status: Former Smoker    Packs/day: 1.50    Years: 20.00    Pack years: 30.00    Types: Cigarettes    Last attempt to quit: 06/01/1978    Years since quitting: 39.0  . Smokeless tobacco: Never Used  Substance and Sexual Activity  . Alcohol use: No    Alcohol/week: 0.0 oz    Comment: former Alcoholic, meets with AA once a week. Has been alcohol free for 34 years  . Drug use: No  . Sexual activity: None  Other Topics Concern  . None  Social History Narrative  . None    Outpatient Encounter Medications as of 06/24/2017  Medication Sig  . brimonidine (ALPHAGAN P) 0.1 % SOLN Apply 1 drop to eye 2 (two)  times daily. Reported on 07/11/2015  . colchicine 0.6 MG tablet TAKE 1 TABLET BY MOUTH EVERY DAY AS NEEDED FOR GOUT  . cyanocobalamin (,VITAMIN B-12,) 1000 MCG/ML injection INJECT 1 ML (1,000 MCG TOTAL) INTO THE MUSCLE EVERY 30 (THIRTY) DAYS.  Marland Kitchen dorzolamide-timolol (COSOPT) 22.3-6.8 MG/ML ophthalmic solution Place 1 drop into both eyes 2 (two) times daily.   Marland Kitchen doxazosin (CARDURA) 8 MG tablet TAKE 1 TABLET (8 MG TOTAL) BY MOUTH DAILY. (Patient taking differently: Take 4 mg by mouth daily. TAKE 1 TABLET (8 MG TOTAL) BY MOUTH DAILY.)  . irbesartan (AVAPRO) 150 MG tablet Take 1 tablet (150 mg total) by mouth daily.  Marland Kitchen latanoprost (XALATAN) 0.005 % ophthalmic solution Place 1 drop into both eyes at bedtime.   Marland Kitchen levothyroxine (SYNTHROID, LEVOTHROID) 125 MCG tablet Take 1 tablet (125 mcg total) by mouth daily at 6 (six) AM.  . probenecid (BENEMID) 500 MG tablet TAKE 0.5 TABLETS (250 MG TOTAL) BY MOUTH ONCE DAILY  . [DISCONTINUED] sildenafil (REVATIO) 20 MG tablet Take 3 to 5 tablets two hours before intercouse on an empty stomach.  Do not take with nitrates.  . [DISCONTINUED] clotrimazole-betamethasone (LOTRISONE) cream Apply 1 application topically 2 (two) times daily.  . [DISCONTINUED] finasteride (PROSCAR) 5 MG tablet Take 1 tablet (5 mg total) by mouth daily. Reported on 07/11/2015  . [DISCONTINUED] hydroxychloroquine (PLAQUENIL) 200 MG tablet TAKE 1 TABLET (200 MG TOTAL) BY MOUTH TWO (2) TIMES A DAY.  . [DISCONTINUED] hydrOXYzine (ATARAX/VISTARIL) 10 MG tablet Take 1-2 tablets (10-20 mg total) by mouth 3 (three) times daily as needed. (Patient not taking: Reported on 01/26/2017)  . [DISCONTINUED] methotrexate (RHEUMATREX) 10 MG tablet Take 10 mg by mouth once a week. Caution: Chemotherapy. Protect from light.   No facility-administered encounter medications on file as of 06/24/2017.     Activities of Daily Living In your present state of health, do you have any difficulty performing the following  activities: 06/24/2017  Hearing? N  Vision? Y  Comment Due to glaucoma.  Difficulty concentrating or making decisions? N  Walking or climbing stairs? N  Dressing or bathing? N  Doing errands, shopping? N  Preparing Food and eating ? N  Using the Toilet? N  In the past six months, have you accidently leaked urine? N  Do you have problems with loss of bowel control? N  Managing your Medications? N  Managing your Finances? N  Housekeeping or managing your Housekeeping? N  Some recent data might be hidden    Patient Care Team: Birdie Sons, MD as PCP -  General (Family Medicine) Regino Schultze, MD as Referring Physician (Dermatology) Leandrew Koyanagi, MD as Referring Physician (Ophthalmology) Emmaline Kluver., MD as Consulting Physician (Rheumatology)   Assessment:   This is a routine wellness examination for Jonathandavid.  Exercise Activities and Dietary recommendations Current Exercise Habits: Home exercise routine(walks dog 3 times a day), Type of exercise: walking, Time (Minutes): 15, Frequency (Times/Week): 7, Weekly Exercise (Minutes/Week): 105  Goals    . DIET - INCREASE WATER INTAKE     Recommend increasing water intake to 6-8 glasses a day.        Fall Risk Fall Risk  06/24/2017 05/27/2016 04/08/2015  Falls in the past year? Yes No No  Number falls in past yr: 2 or more - -  Comment tripped x 2 - -  Injury with Fall? No - -  Follow up Falls prevention discussed - -   Is the patient's home free of loose throw rugs in walkways, pet beds, electrical cords, etc?   yes      Grab bars in the bathroom? yes      Handrails on the stairs?   yes      Adequate lighting?   yes  Timed Get Up and Go Performed: N/A  Depression Screen PHQ 2/9 Scores 06/24/2017 06/24/2017 05/27/2016 04/08/2015  PHQ - 2 Score 0 0 0 0  PHQ- 9 Score 0 - - 1    Cognitive Function: Pt declined screening today.     6CIT Screen 05/27/2016  What Year? 0 points  What month? 0 points  What  time? 0 points  Count back from 20 0 points  Months in reverse 2 points  Repeat phrase 2 points  Total Score 4    Immunization History  Administered Date(s) Administered  . Influenza, High Dose Seasonal PF 03/08/2015, 03/07/2016, 03/18/2017  . Influenza,inj,Quad PF,6+ Mos 03/28/2013, 02/14/2014  . Influenza-Unspecified 01/30/2014  . Pneumococcal Conjugate-13 02/14/2014  . Pneumococcal Polysaccharide-23 06/01/2008  . Tdap 10/18/2012  . Zoster 09/11/2014    Qualifies for Shingles Vaccine? Due for Shingles vaccine. Declined my offer to administer today. Education has been provided regarding the importance of this vaccine. Pt has been advised to call her insurance company to determine her out of pocket expense. Advised she may also receive this vaccine at her local pharmacy or Health Dept. Verbalized acceptance and understanding.  Screening Tests Health Maintenance  Topic Date Due  . TETANUS/TDAP  10/19/2022  . INFLUENZA VACCINE  Completed  . PNA vac Low Risk Adult  Completed   Cancer Screenings: Lung: Low Dose CT Chest recommended if Age 39-80 years, 30 pack-year currently smoking OR have quit w/in 15years. Patient does not qualify. Colorectal: Up to date  Additional Screenings:  Hepatitis B/HIV/Syphillis: Pt declines today.  Hepatitis C Screening: Pt declines today.     Plan:  I have personally reviewed and addressed the Medicare Annual Wellness questionnaire and have noted the following in the patient's chart:  A. Medical and social history B. Use of alcohol, tobacco or illicit drugs  C. Current medications and supplements D. Functional ability and status E.  Nutritional status F.  Physical activity G. Advance directives H. List of other physicians I.  Hospitalizations, surgeries, and ER visits in previous 12 months J.  [place] such as hearing and vision if needed, cognitive and depression L. Referrals and appointments - none  In addition, I have reviewed  and discussed with patient certain preventive protocols, quality metrics, and best practice recommendations. A  written personalized care plan for preventive services as well as general preventive health recommendations were provided to patient.  See attached scanned questionnaire for additional information.   Signed,  Fabio Neighbors, LPN Nurse Health Advisor   Nurse Recommendations: None.

## 2017-06-24 NOTE — Patient Instructions (Addendum)
Jacob Nielsen , Thank you for taking time to come for your Medicare Wellness Visit. I appreciate your ongoing commitment to your health goals. Please review the following plan we discussed and let me know if I can assist you in the future.   Screening recommendations/referrals: Colonoscopy: Up to date Recommended yearly ophthalmology/optometry visit for glaucoma screening and checkup Recommended yearly dental visit for hygiene and checkup  Vaccinations: Influenza vaccine: Up to date Pneumococcal vaccine: Up to date Tdap vaccine: Up to date Shingles vaccine: Pt declines today.     Advanced directives: Please bring a copy of your POA (Power of Attorney) and/or Living Will to your next appointment.   Conditions/risks identified: Recommend increasing water intake to 6-8 glasses a day.   Next appointment: Advised pt to schedule physical with PCP- declined today. Scheduled next AWV for 06/24/18.  Preventive Care 65 Years and Older, Male Preventive care refers to lifestyle choices and visits with your health care provider that can promote health and wellness. What does preventive care include?  A yearly physical exam. This is also called an annual well check.  Dental exams once or twice a year.  Routine eye exams. Ask your health care provider how often you should have your eyes checked.  Personal lifestyle choices, including:  Daily care of your teeth and gums.  Regular physical activity.  Eating a healthy diet.  Avoiding tobacco and drug use.  Limiting alcohol use.  Practicing safe sex.  Taking low doses of aspirin every day.  Taking vitamin and mineral supplements as recommended by your health care provider. What happens during an annual well check? The services and screenings done by your health care provider during your annual well check will depend on your age, overall health, lifestyle risk factors, and family history of disease. Counseling  Your health care provider may  ask you questions about your:  Alcohol use.  Tobacco use.  Drug use.  Emotional well-being.  Home and relationship well-being.  Sexual activity.  Eating habits.  History of falls.  Memory and ability to understand (cognition).  Work and work Statistician. Screening  You may have the following tests or measurements:  Height, weight, and BMI.  Blood pressure.  Lipid and cholesterol levels. These may be checked every 5 years, or more frequently if you are over 3 years old.  Skin check.  Lung cancer screening. You may have this screening every year starting at age 82 if you have a 30-pack-year history of smoking and currently smoke or have quit within the past 15 years.  Fecal occult blood test (FOBT) of the stool. You may have this test every year starting at age 107.  Flexible sigmoidoscopy or colonoscopy. You may have a sigmoidoscopy every 5 years or a colonoscopy every 10 years starting at age 82.  Prostate cancer screening. Recommendations will vary depending on your family history and other risks.  Hepatitis C blood test.  Hepatitis B blood test.  Sexually transmitted disease (STD) testing.  Diabetes screening. This is done by checking your blood sugar (glucose) after you have not eaten for a while (fasting). You may have this done every 1-3 years.  Abdominal aortic aneurysm (AAA) screening. You may need this if you are a current or former smoker.  Osteoporosis. You may be screened starting at age 82 if you are at high risk. Talk with your health care provider about your test results, treatment options, and if necessary, the need for more tests. Vaccines  Your health care provider  may recommend certain vaccines, such as:  Influenza vaccine. This is recommended every year.  Tetanus, diphtheria, and acellular pertussis (Tdap, Td) vaccine. You may need a Td booster every 10 years.  Zoster vaccine. You may need this after age 82.  Pneumococcal 13-valent  conjugate (PCV13) vaccine. One dose is recommended after age 82.  Pneumococcal polysaccharide (PPSV23) vaccine. One dose is recommended after age 82. Talk to your health care provider about which screenings and vaccines you need and how often you need them. This information is not intended to replace advice given to you by your health care provider. Make sure you discuss any questions you have with your health care provider. Document Released: 06/14/2015 Document Revised: 02/05/2016 Document Reviewed: 03/19/2015 Elsevier Interactive Patient Education  2017 [place] Prevention in the Home Falls can cause injuries. They can happen to people of all ages. There are many things you can do to make your home safe and to help prevent falls. What can I do on the outside of my home?  Regularly fix the edges of walkways and driveways and fix any cracks.  Remove anything that might make you trip as you walk through a door, such as a raised step or threshold.  Trim any bushes or trees on the path to your home.  Use bright outdoor lighting.  Clear any walking paths of anything that might make someone trip, such as rocks or tools.  Regularly check to see if handrails are loose or broken. Make sure that both sides of any steps have handrails.  Any raised decks and porches should have guardrails on the edges.  Have any leaves, snow, or ice cleared regularly.  Use sand or salt on walking paths during winter.  Clean up any spills in your garage right away. This includes oil or grease spills. What can I do in the bathroom?  Use night lights.  Install grab bars by the toilet and in the tub and shower. Do not use towel bars as grab bars.  Use non-skid mats or decals in the tub or shower.  If you need to sit down in the shower, use a plastic, non-slip stool.  Keep the floor dry. Clean up any water that spills on the floor as soon as it happens.  Remove soap buildup in the tub or  shower regularly.  Attach bath mats securely with double-sided non-slip rug tape.  Do not have throw rugs and other things on the floor that can make you trip. What can I do in the bedroom?  Use night lights.  Make sure that you have a light by your bed that is easy to reach.  Do not use any sheets or blankets that are too big for your bed. They should not hang down onto the floor.  Have a firm chair that has side arms. You can use this for support while you get dressed.  Do not have throw rugs and other things on the floor that can make you trip. What can I do in the kitchen?  Clean up any spills right away.  Avoid walking on wet floors.  Keep items that you use a lot in easy-to-reach places.  If you need to reach something above you, use a strong step stool that has a grab bar.  Keep electrical cords out of the way.  Do not use floor polish or wax that makes floors slippery. If you must use wax, use non-skid floor wax.  Do not have throw rugs  and other things on the floor that can make you trip. What can I do with my stairs?  Do not leave any items on the stairs.  Make sure that there are handrails on both sides of the stairs and use them. Fix handrails that are broken or loose. Make sure that handrails are as long as the stairways.  Check any carpeting to make sure that it is firmly attached to the stairs. Fix any carpet that is loose or worn.  Avoid having throw rugs at the top or bottom of the stairs. If you do have throw rugs, attach them to the floor with carpet tape.  Make sure that you have a light switch at the top of the stairs and the bottom of the stairs. If you do not have them, ask someone to add them for you. What else can I do to help prevent falls?  Wear shoes that:  Do not have high heels.  Have rubber bottoms.  Are comfortable and fit you well.  Are closed at the toe. Do not wear sandals.  If you use a stepladder:  Make sure that it is fully  opened. Do not climb a closed stepladder.  Make sure that both sides of the stepladder are locked into place.  Ask someone to hold it for you, if possible.  Clearly mark and make sure that you can see:  Any grab bars or handrails.  First and last steps.  Where the edge of each step is.  Use tools that help you move around (mobility aids) if they are needed. These include:  Canes.  Walkers.  Scooters.  Crutches.  Turn on the lights when you go into a dark area. Replace any light bulbs as soon as they burn out.  Set up your furniture so you have a clear path. Avoid moving your furniture around.  If any of your floors are uneven, fix them.  If there are any pets around you, be aware of where they are.  Review your medicines with your doctor. Some medicines can make you feel dizzy. This can increase your chance of falling. Ask your doctor what other things that you can do to help prevent falls. This information is not intended to replace advice given to you by your health care provider. Make sure you discuss any questions you have with your health care provider. Document Released: 03/14/2009 Document Revised: 10/24/2015 Document Reviewed: 06/22/2014 Elsevier Interactive Patient Education  2017 [employer].

## 2017-06-26 ENCOUNTER — Other Ambulatory Visit: Payer: Self-pay | Admitting: Urology

## 2017-06-26 DIAGNOSIS — N401 Enlarged prostate with lower urinary tract symptoms: Secondary | ICD-10-CM

## 2017-07-01 ENCOUNTER — Encounter: Payer: PPO | Admitting: Family Medicine

## 2017-07-08 DIAGNOSIS — R21 Rash and other nonspecific skin eruption: Secondary | ICD-10-CM | POA: Diagnosis not present

## 2017-07-08 DIAGNOSIS — L308 Other specified dermatitis: Secondary | ICD-10-CM | POA: Diagnosis not present

## 2017-07-26 ENCOUNTER — Encounter: Payer: Self-pay | Admitting: Family Medicine

## 2017-07-26 ENCOUNTER — Ambulatory Visit (INDEPENDENT_AMBULATORY_CARE_PROVIDER_SITE_OTHER): Payer: PPO | Admitting: Family Medicine

## 2017-07-26 VITALS — BP 136/82 | HR 62 | Temp 97.6°F | Resp 16 | Ht 72.0 in | Wt 193.0 lb

## 2017-07-26 DIAGNOSIS — E785 Hyperlipidemia, unspecified: Secondary | ICD-10-CM

## 2017-07-26 DIAGNOSIS — Z0000 Encounter for general adult medical examination without abnormal findings: Secondary | ICD-10-CM

## 2017-07-26 DIAGNOSIS — I10 Essential (primary) hypertension: Secondary | ICD-10-CM

## 2017-07-26 DIAGNOSIS — E039 Hypothyroidism, unspecified: Secondary | ICD-10-CM

## 2017-07-26 DIAGNOSIS — M109 Gout, unspecified: Secondary | ICD-10-CM | POA: Diagnosis not present

## 2017-07-26 NOTE — Patient Instructions (Signed)
   The CDC recommends two doses of Shingrix (the shingles vaccine) separated by 2 to 6 months for adults age 82 years and older. I recommend checking with your insurance plan regarding coverage for this vaccine.   

## 2017-07-26 NOTE — Progress Notes (Signed)
Patient: Jacob Nielsen., Male    DOB: 06/28/1933, 84 y.o.   MRN: [id number] Visit Date: 07/26/2017  Today's Provider: Lelon Huh, MD   Chief Complaint  Patient presents with  . Annual Exam  . Hypertension  . Hypothyroidism  . Anemia   Subjective:     Complete Physical Jacob SHANARD TRETO Sr. is a 84 y.o. male. He feels well. He reports exercising daily. He reports he is sleeping fairly well.  -----------------------------------------------------------  Hypertension, follow-up:  BP Readings from Last 3 Encounters:  06/24/17 114/72  02/19/17 132/72  01/26/17 120/70    He was last seen for hypertension 1 years ago.  BP at that visit was 110/60. Management since that visit includes no changes. He reports good compliance with treatment. He is not having side effects.  He is exercising. He is not adherent to low salt diet.   Outside blood pressures are 130/65. He is experiencing none.  Patient denies chest pain, chest pressure/discomfort, claudication, dyspnea, exertional chest pressure/discomfort, fatigue, irregular heart beat, lower extremity edema, near-syncope, orthopnea, palpitations, paroxysmal nocturnal dyspnea, syncope and tachypnea.   Cardiovascular risk factors include advanced age (older than 24 for men, 58 for women), hypertension and male gender.  Use of agents associated with hypertension: thyroid hormones.     Weight trend: stable Wt Readings from Last 3 Encounters:  06/24/17 189 lb 12.8 oz (86.1 kg)  02/19/17 190 lb (86.2 kg)  01/26/17 194 lb (88 kg)    Current diet: well balanced  ------------------------------------------------------------------------ Follow up of Hypothyroidism:  Patient was last seen for this problem 1 year ago and no changes were made. Patient reports good compliance with treatment and good tolerance. Lab Results  Component Value Date   TSH 1.050 07/23/2016     Follow up of Gout:  Patient was last seen for this problem  6 months ago and no changes were made. Patient has had a few flare ups of gout but reports this problem is unchanged since last visit. Has flares about once a month which clears after a few days colchicine.    Follow up of Pernicious Anemia:  Patient was last seen for this problem several months ago and no changes were made.   Review of Systems  Constitutional: Positive for diaphoresis. Negative for appetite change, chills, fatigue and fever.  HENT: Negative for congestion, ear pain, hearing loss, nosebleeds and trouble swallowing.   Eyes: Negative for pain and visual disturbance.  Respiratory: Negative for cough, chest tightness and shortness of breath.   Cardiovascular: Negative for chest pain, palpitations and leg swelling.  Gastrointestinal: Negative for abdominal pain, blood in stool, constipation, diarrhea, nausea and vomiting.  Endocrine: Negative for polydipsia, polyphagia and polyuria.  Genitourinary: Negative for dysuria and flank pain.  Musculoskeletal: Positive for arthralgias and joint swelling. Negative for back pain, myalgias and neck stiffness.  Skin: Negative for color change, rash and wound.  Allergic/Immunologic: Positive for food allergies.  Neurological: Negative for dizziness, tremors, seizures, speech difficulty, weakness, light-headedness and headaches.  Psychiatric/Behavioral: Negative for behavioral problems, confusion, decreased concentration, dysphoric mood and sleep disturbance. The patient is not nervous/anxious.   All other systems reviewed and are negative.   Social History   Socioeconomic History  . Marital status: Married    Spouse name: Jacob Nielsen  . Number of children: 2  . Years of education: Not on file  . Highest education level: Some college, no degree  Social Needs  . Financial resource strain:  Not hard at all  . Food insecurity - worry: Never true  . Food insecurity - inability: Never true  . Transportation needs - medical: No  .  Transportation needs - non-medical: No  Occupational History  . Occupation: Retired    Comment: Previously worked at a [employer]  . Smoking status: Former Smoker    Packs/day: 1.50    Years: 20.00    Pack years: 30.00    Types: Cigarettes    Last attempt to quit: 06/01/1978    Years since quitting: 39.1  . Smokeless tobacco: Never Used  Substance and Sexual Activity  . Alcohol use: No    Alcohol/week: 0.0 oz    Comment: former Alcoholic, meets with AA once a week. Has been alcohol free for 34 years  . Drug use: No  . Sexual activity: Not on file  Other Topics Concern  . Not on file  Social History Narrative  . Not on file    Past Medical History:  Diagnosis Date  . Arthritis    right hand  . BPH (benign prostatic hyperplasia)   . ED (erectile dysfunction)   . Elevated PSA   . GERD (gastroesophageal reflux disease)    occasional  . Glaucoma   . Gout   . Gout   . History of alcoholism ([place])   . History of chicken pox   . History of measles   . History of mumps   . History of pernicious anemia   . HTN (hypertension)    controlled  . Hypothyroidism   . Wears partial dentures    upper and lower      Patient Active Problem List   Diagnosis Date Noted  . Tinea corporis 07/22/2016  . Elevated PSA 07/13/2015  . Allergic rhinitis 03/08/2015  . BPH (benign prostatic hyperplasia) 03/08/2015  . Chronic kidney disease (CKD), stage III (moderate) ([place]) 03/08/2015  . Essential (primary) hypertension 03/08/2015  . Gouty arthritis 03/08/2015  . Gout 03/08/2015  . H/O alcohol abuse 03/08/2015  . Hyperlipidemia 03/08/2015  . Hypothyroid 03/08/2015  . Open-angle glaucoma 03/08/2015  . Osteopenia 03/08/2015  . Pernicious anemia 03/08/2015  . Pruritus 03/08/2015  . Psoriasiform dermatitis 04/18/2013  . Other long term (current) drug therapy 03/15/2013  . Polypharmacy 03/15/2013  . Dermatophytosis of groin 08/02/2012  . Dermatitis, eczematoid 05/17/2012     Past Surgical History:  Procedure Laterality Date  . EYE SURGERY     laser eye surgery on right eye for glaucoma  . PARATHYROIDECTOMY  2004  . PHOTOCOAGULATION WITH LASER Right 10/07/2015   Procedure: PHOTOCOAGULATION WITH LASER RIGHT EYE TRANS SCLERAL;  Surgeon: Ronnell Freshwater, MD;  Location: [facility];  Service: Ophthalmology;  Laterality: Right;  . TONSILLECTOMY      His family history includes Asthma in his mother; Heart attack in his father; Parkinson's disease in his father. There is no history of Kidney disease or Prostate cancer.      Current Outpatient Medications:  .  brimonidine (ALPHAGAN P) 0.1 % SOLN, Apply 1 drop to eye 2 (two) times daily. Reported on 07/11/2015, Disp: , Rfl:  .  cetirizine (ZYRTEC) 10 MG tablet, Take 10 mg by mouth daily., Disp: , Rfl:  .  colchicine 0.6 MG tablet, TAKE 1 TABLET BY MOUTH EVERY DAY AS NEEDED FOR GOUT, Disp: 30 tablet, Rfl: 5 .  cyanocobalamin (,VITAMIN B-12,) 1000 MCG/ML injection, INJECT 1 ML (1,000 MCG TOTAL) INTO THE MUSCLE EVERY 30 (THIRTY) DAYS., Disp:  1 mL, Rfl: 4 .  diclofenac (VOLTAREN) 50 MG EC tablet, Take 50 mg by mouth 3 (three) times daily as needed., Disp: , Rfl:  .  dorzolamide-timolol (COSOPT) 22.3-6.8 MG/ML ophthalmic solution, Place 1 drop into both eyes 2 (two) times daily. , Disp: , Rfl:  .  doxazosin (CARDURA) 8 MG tablet, TAKE 1 TABLET (8 MG TOTAL) BY MOUTH DAILY. (Patient taking differently: Take 4 mg by mouth daily. TAKE 1 TABLET (8 MG TOTAL) BY MOUTH DAILY.), Disp: 90 tablet, Rfl: 4 .  finasteride (PROSCAR) 5 MG tablet, TAKE 1 TABLET (5 MG TOTAL) BY MOUTH DAILY. REPORTED ON 07/11/2015, Disp: 90 tablet, Rfl: 1 .  irbesartan (AVAPRO) 150 MG tablet, Take 1 tablet (150 mg total) by mouth daily., Disp: 90 tablet, Rfl: 3 .  latanoprost (XALATAN) 0.005 % ophthalmic solution, Place 1 drop into both eyes at bedtime. , Disp: , Rfl:  .  levothyroxine (SYNTHROID, LEVOTHROID) 125 MCG tablet, Take 1 tablet (125  mcg total) by mouth daily at 6 (six) AM., Disp: 90 tablet, Rfl: 4 .  probenecid (BENEMID) 500 MG tablet, TAKE 0.5 TABLETS (250 MG TOTAL) BY MOUTH ONCE DAILY, Disp: , Rfl: 2  Patient Care Team: Birdie Sons, MD as PCP - General (Family Medicine) Regino Schultze, MD as Referring Physician (Dermatology) Leandrew Koyanagi, MD as Referring Physician (Ophthalmology) Emmaline Kluver., MD as Consulting Physician (Rheumatology)     Objective:   Vitals: BP 136/82 (BP Location: Left Arm, Patient Position: Sitting, Cuff Size: Large)   Pulse 62   Temp 97.6 F (36.4 C) (Oral)   Resp 16   Ht 6' (1.829 m)   Wt 193 lb (87.5 kg)   SpO2 99% Comment: room air  BMI 26.18 kg/m   Physical Exam   General Appearance:    Alert, cooperative, no distress, appears stated age  Head:    Normocephalic, without obvious abnormality, atraumatic  Eyes:    PERRL, conjunctiva/corneas clear, EOM's intact, fundi    benign, both eyes       Ears:    Normal TM's and external ear canals, both ears  Nose:   Nares normal, septum midline, mucosa normal, no drainage   or sinus tenderness  Throat:   Lips, mucosa, and tongue normal; teeth and gums normal  Neck:   Supple, symmetrical, trachea midline, no adenopathy;       thyroid:  No enlargement/tenderness/nodules; no carotid   bruit or JVD  Back:     Symmetric, no curvature, ROM normal, no CVA tenderness  Lungs:     Clear to auscultation bilaterally, respirations unlabored  Chest wall:    No tenderness or deformity  Heart:    Regular rate and rhythm, S1 and S2 normal, no murmur, rub   or gallop  Abdomen:     Soft, non-tender, bowel sounds active all four quadrants,    no masses, no organomegaly  Genitalia:    deferred  Rectal:    deferred  Extremities:   Extremities normal, atraumatic, no cyanosis or edema  Pulses:   2+ and symmetric all extremities  Skin:   Skin color, texture, turgor normal, no rashes or lesions  Lymph nodes:   Cervical,  supraclavicular, and axillary nodes normal  Neurologic:   CNII-XII intact. Normal strength, sensation and reflexes      throughout    Activities of Daily Living In your present state of health, do you have any difficulty performing the following activities: 06/24/2017  Hearing? N  Vision?  Y  Comment Due to glaucoma.  Difficulty concentrating or making decisions? N  Walking or climbing stairs? N  Dressing or bathing? N  Doing errands, shopping? N  Preparing Food and eating ? N  Using the Toilet? N  In the past six months, have you accidently leaked urine? N  Do you have problems with loss of bowel control? N  Managing your Medications? N  Managing your Finances? N  Housekeeping or managing your Housekeeping? N  Some recent data might be hidden    Fall Risk Assessment Fall Risk  06/24/2017 05/27/2016 04/08/2015  Falls in the past year? Yes No No  Number falls in past yr: 2 or more - -  Comment tripped x 2 - -  Injury with Fall? No - -  Follow up Falls prevention discussed - -     Depression Screen PHQ 2/9 Scores 06/24/2017 06/24/2017 05/27/2016 04/08/2015  PHQ - 2 Score 0 0 0 0  PHQ- 9 Score 0 - - 1    Cognitive Testing - 6-CIT-Patient declined      Assessment & Plan:    Annual Physical Reviewed patient's Family Medical History Reviewed and updated list of patient's medical providers Assessment of cognitive impairment was done Assessed patient's functional ability Established a written schedule for health screening [place] Completed and Reviewed  Exercise Activities and Dietary recommendations Goals    . DIET - INCREASE WATER INTAKE     Recommend increasing water intake to 6-8 glasses a day.        Immunization History  Administered Date(s) Administered  . Influenza, High Dose Seasonal PF 03/08/2015, 03/07/2016, 03/18/2017  . Influenza,inj,Quad PF,6+ Mos 03/28/2013, 02/14/2014  . Influenza-Unspecified 01/30/2014  . Pneumococcal  Conjugate-13 02/14/2014  . Pneumococcal Polysaccharide-23 06/01/2008  . Tdap 10/18/2012  . Zoster 09/11/2014    Health Maintenance  Topic Date Due  . Samul Dada  10/19/2022  . INFLUENZA VACCINE  Completed  . PNA vac Low Risk Adult  Completed     Discussed health benefits of physical activity, and encouraged him to engage in regular exercise appropriate for his age and condition.    ------------------------------------------------------------------------------------------------------------  1. Annual physical exam Unremarkable exam. Reviewed HM items.   2. Essential (primary) hypertension Well controlled.  Continue current medications.   - EKG 12-Lead  3. Hypothyroidism, unspecified type  - TSH  4. Gout, unspecified cause, unspecified chronicity, unspecified site Prn colchicine effective, but having a few flares a months. Has recently started back on 1/2 of probenicid daily.  - Uric acid  5. Hyperlipidemia, unspecified hyperlipidemia type Currently diet controlled.  - Lipid panel   Lelon Huh, MD  [facility]

## 2017-07-27 LAB — LIPID PANEL
CHOL/HDL RATIO: 4 ratio (ref 0.0–5.0)
Cholesterol, Total: 164 mg/dL (ref 100–199)
HDL: 41 mg/dL (ref >39)
LDL CALC: 106 mg/dL — AB (ref 0–99)
TRIGLYCERIDES: 87 mg/dL (ref 0–149)
VLDL Cholesterol Cal: 17 mg/dL (ref 5–40)

## 2017-07-27 LAB — URIC ACID: Uric Acid: 7.2 mg/dL (ref 3.7–8.6)

## 2017-07-27 LAB — TSH: TSH: 6.33 u[IU]/mL — ABNORMAL HIGH (ref 0.450–4.500)

## 2017-08-09 DIAGNOSIS — H35372 Puckering of macula, left eye: Secondary | ICD-10-CM | POA: Diagnosis not present

## 2017-08-09 DIAGNOSIS — H401133 Primary open-angle glaucoma, bilateral, severe stage: Secondary | ICD-10-CM | POA: Diagnosis not present

## 2017-08-26 ENCOUNTER — Telehealth: Payer: Self-pay

## 2017-08-26 ENCOUNTER — Other Ambulatory Visit: Payer: Self-pay | Admitting: Family Medicine

## 2017-08-26 MED ORDER — PROBENECID 500 MG PO TABS: ORAL_TABLET | ORAL | 2 refills | Status: DC

## 2017-08-26 NOTE — Telephone Encounter (Signed)
Opened in ERROR

## 2017-08-26 NOTE — Patient Instructions (Signed)

## 2017-09-13 DIAGNOSIS — M79641 Pain in right hand: Secondary | ICD-10-CM | POA: Diagnosis not present

## 2017-09-13 DIAGNOSIS — N183 Chronic kidney disease, stage 3 (moderate): Secondary | ICD-10-CM | POA: Diagnosis not present

## 2017-09-13 DIAGNOSIS — M1A00X0 Idiopathic chronic gout, unspecified site, without tophus (tophi): Secondary | ICD-10-CM | POA: Diagnosis not present

## 2017-10-04 ENCOUNTER — Other Ambulatory Visit
Admission: RE | Admit: 2017-10-04 | Discharge: 2017-10-04 | Disposition: A | Discharge status: Home / Self Care | Payer: PPO | Source: Ambulatory Visit | Attending: Rheumatology | Admitting: Rheumatology

## 2017-10-04 DIAGNOSIS — M7022 Olecranon bursitis, left elbow: Secondary | ICD-10-CM | POA: Insufficient documentation

## 2017-10-04 DIAGNOSIS — M1A00X0 Idiopathic chronic gout, unspecified site, without tophus (tophi): Secondary | ICD-10-CM | POA: Insufficient documentation

## 2017-10-04 DIAGNOSIS — N183 Chronic kidney disease, stage 3 (moderate): Secondary | ICD-10-CM | POA: Diagnosis not present

## 2017-10-04 LAB — SYNOVIAL CELL COUNT + DIFF, W/ CRYSTALS
Eosinophils-Synovial: 0 %
LYMPHOCYTES-SYNOVIAL FLD: 0 %
Monocyte-Macrophage-Synovial Fluid: 78 %
Neutrophil, Synovial: 22 %
OTHER CELLS-SYN: 0
WBC, Synovial: 2107 /mm3 — ABNORMAL HIGH (ref 0–200)

## 2017-10-10 ENCOUNTER — Other Ambulatory Visit: Payer: Self-pay | Admitting: Family Medicine

## 2017-11-09 DIAGNOSIS — M1A00X0 Idiopathic chronic gout, unspecified site, without tophus (tophi): Secondary | ICD-10-CM | POA: Diagnosis not present

## 2017-11-09 DIAGNOSIS — Z79899 Other long term (current) drug therapy: Secondary | ICD-10-CM | POA: Diagnosis not present

## 2017-12-15 NOTE — Progress Notes (Signed)
12:00 PM   Jacob Nielsen. 1933-07-22 [id number]  Referring provider: Birdie Sons, MD [address] [place], [place] [address]  Chief Complaint  Patient presents with  . Elevated PSA    HPI: Patient is a 84 year old Caucasian male with a history of elevated PSA, ED and BPH with LUTS who presents today for a 12 month follow-up.     Elevated PSA PSA Trend  6.0 (12) in 2012 - bx negative - on finasteride  3.6 (7.2) in 09/2011  5.4 (10.8) in 04/2012  3.2 (6.4) in 09/2012  3.6 (7.2) in 04/2013  10.2 in 07/2015 - no finasteride  9.4 in 07/2015 - on finasteride  6.4 in 07/2015 - no finasteride  5.0 in 11/2015  7.7 in 11/2016     BPH WITH LUTS His IPSS score today is 17, which is moderate lower urinary tract symptomatology. He is mostly satisfied  with his quality life due to his urinary symptoms.  He is currently experiencing seen intermittency, hesitancy and a weak urinary stream.   He has had these symptoms for several years.  His PVR is 13 mL.  His previous I PSS score was 16/3.  He is currently taking doxazosin 8 mg daily.  He denies any dysuria, hematuria or suprapubic pain.  He also denies any recent fevers, chills, nausea or vomiting.  He does not have a family history of PCa.  IPSS    Row Name 12/16/17 1100         International Prostate Symptom Score   How often have you had the sensation of not emptying your bladder?  About half the time     How often have you had to urinate less than every two hours?  About half the time     How often have you found you stopped and started again several times when you urinated?  Less than half the time     How often have you found it difficult to postpone urination?  Less than half the time     How often have you had a weak urinary stream?  About half the time     How often have you had to strain to start urination?  Less than half the time     How many times did you typically get up at night to urinate?  2 Times      Total IPSS Score  17       Quality of Life due to urinary symptoms   If you were to spend the rest of your life with your urinary condition just the way it is now how would you feel about that?  Mostly Satisfied        Score:  1-7 Mild 8-19 Moderate 20-35 Severe  Erectile dysfunction He has been having difficulty with erections for one year.   His major complaint is lack of firmness.  His libido is preserved.   His risk factors for ED are age, BPH, HTN, HLD and CKD,   He denies any painful erections or curvatures with his erections.   He is still having spontaneous erections.  Sildenafil is effective.   PMH: Past Medical History:  Diagnosis Date  . Arthritis    right hand  . BPH (benign prostatic hyperplasia)   . ED (erectile dysfunction)   . Elevated PSA   . GERD (gastroesophageal reflux disease)    occasional  . Glaucoma   . Gout   . Gout   .  History of alcoholism ([place])   . History of chicken pox   . History of measles   . History of mumps   . History of pernicious anemia   . HTN (hypertension)    controlled  . Hypothyroidism   . Wears partial dentures    upper and lower     Surgical History: Past Surgical History:  Procedure Laterality Date  . EYE SURGERY     laser eye surgery on right eye for glaucoma  . PARATHYROIDECTOMY  2004  . PHOTOCOAGULATION WITH LASER Right 10/07/2015   Procedure: PHOTOCOAGULATION WITH LASER RIGHT EYE TRANS SCLERAL;  Surgeon: Ronnell Freshwater, MD;  Location: [facility];  Service: Ophthalmology;  Laterality: Right;  . TONSILLECTOMY      Home Medications:  Allergies as of 12/16/2017      Reactions   Krystexxa  [pegloticase] Anaphylaxis   Allopurinol    Abdominal pain and itching   Ciprofloxacin    causes mouth to itch   Levofloxacin Other (See Comments)   Tendon pain   Mycophenolate Mofetil    Niacin    Other reaction(s): UNKNOWN   Sulfa Antibiotics Itching   Uloric  [febuxostat]    Other reaction(s):  Abdominal pain      Medication List        Accurate as of 12/16/17 12:00 PM. Always use your most recent med list.          brimonidine 0.1 % Soln Commonly known as:  ALPHAGAN P Apply 1 drop to eye 2 (two) times daily. Reported on 07/11/2015   cetirizine 10 MG tablet Commonly known as:  ZYRTEC Take 10 mg by mouth daily.   cyanocobalamin 1000 MCG/ML injection Commonly known as:  (VITAMIN B-12) INJECT 1 ML (1,000 MCG TOTAL) INTO THE MUSCLE EVERY 30 (THIRTY) DAYS.   diclofenac 50 MG EC tablet Commonly known as:  VOLTAREN Take 50 mg by mouth 3 (three) times daily as needed.   dorzolamide-timolol 22.3-6.8 MG/ML ophthalmic solution Commonly known as:  COSOPT Place 1 drop into both eyes 2 (two) times daily.   doxazosin 8 MG tablet Commonly known as:  CARDURA TAKE 1 TABLET (8 MG TOTAL) BY MOUTH DAILY.   finasteride 5 MG tablet Commonly known as:  PROSCAR TAKE 1 TABLET (5 MG TOTAL) BY MOUTH DAILY. REPORTED ON 07/11/2015   irbesartan 150 MG tablet Commonly known as:  AVAPRO Take 1 tablet (150 mg total) by mouth daily.   latanoprost 0.005 % ophthalmic solution Commonly known as:  XALATAN Place 1 drop into both eyes at bedtime.   levothyroxine 125 MCG tablet Commonly known as:  SYNTHROID, LEVOTHROID Take 1 tablet (125 mcg total) by mouth daily at 6 (six) AM.   probenecid 500 MG tablet Commonly known as:  BENEMID TAKE 0.5 TABLETS (250 MG TOTAL) BY MOUTH TWICE DAILY   sildenafil 20 MG tablet Commonly known as:  REVATIO Take 3 to 5 tablets two hours before intercouse on an empty stomach.  Do not take with nitrates.       Allergies:  Allergies  Allergen Reactions  . Krystexxa  [Pegloticase] Anaphylaxis  . Allopurinol     Abdominal pain and itching  . Ciprofloxacin     causes mouth to itch  . Levofloxacin Other (See Comments)    Tendon pain  . Mycophenolate Mofetil   . Niacin     Other reaction(s): UNKNOWN  . Sulfa Antibiotics Itching  . Uloric  [Febuxostat]      Other reaction(s): Abdominal pain  Family History: Family History  Problem Relation Age of Onset  . Asthma Mother   . Heart attack Father   . Parkinson's disease Father   . Kidney disease Neg Hx   . Prostate cancer Neg Hx        maybe paternal grandfather ? unsure    Social History:  reports that he quit smoking about 39 years ago. His smoking use included cigarettes. He has a 30.00 pack-year smoking history. He has never used smokeless tobacco. He reports that he does not drink alcohol or use drugs.  ROS: UROLOGY Frequent Urination?: No Hard to postpone urination?: No Burning/pain with urination?: No Get up at night to urinate?: No Leakage of urine?: No Urine stream starts and stops?: Yes Trouble starting stream?: Yes Do you have to strain to urinate?: No Blood in urine?: No Urinary tract infection?: No Sexually transmitted disease?: No Injury to kidneys or bladder?: No Painful intercourse?: No Weak stream?: Yes Erection problems?: No Penile pain?: No  Gastrointestinal Nausea?: No Vomiting?: No Indigestion/heartburn?: No Diarrhea?: No Constipation?: No  Constitutional Fever: No Night sweats?: No Weight loss?: No Fatigue?: No  Skin Skin rash/lesions?: No Itching?: Yes  Eyes Blurred vision?: No Double vision?: No  Ears/Nose/Throat Sore throat?: No Sinus problems?: No  Hematologic/Lymphatic Swollen glands?: No Easy bruising?: No  Cardiovascular Leg swelling?: No Chest pain?: No  Respiratory Cough?: No Shortness of breath?: No  Endocrine Excessive thirst?: No  Musculoskeletal Back pain?: No Joint pain?: Yes  Neurological Headaches?: No Dizziness?: No  Psychologic Depression?: No Anxiety?: No  Physical Exam: BP 134/77   Pulse 74   Wt 189 lb (85.7 kg)   BMI 25.63 kg/m   Constitutional: Well nourished. Alert and oriented, No acute distress. HEENT: [place] AT, moist mucus membranes. Trachea midline, no masses. Cardiovascular: No  clubbing, cyanosis, or edema. Respiratory: Normal respiratory effort, no increased work of breathing. GI: Abdomen is soft, non tender, non distended, no abdominal masses. Liver and spleen not palpable.  No hernias appreciated.  Stool sample for occult testing is not indicated.   GU: No CVA tenderness.  No bladder fullness or masses.  Patient with circumcised phallus.  Urethral meatus is patent.  No penile discharge. No penile lesions or rashes. Scrotum without lesions, cysts, rashes and/or edema.  Testicles are located scrotally bilaterally. No masses are appreciated in the testicles. Left and right epididymis are normal. Rectal: Patient with  normal sphincter tone. Anus and perineum without scarring or rashes. No rectal masses are appreciated. Prostate is approximately 55 grams, could only palpate the apex and the midportion, no nodules are appreciated. Seminal vesicles are normal. Skin: No rashes, bruises or suspicious lesions. Lymph: No cervical or inguinal adenopathy. Neurologic: Grossly intact, no focal deficits, moving all 4 extremities. Psychiatric: Normal mood and affect.   Laboratory Data:   Lab Results  Component Value Date   WBC 7.5 07/23/2016   HGB 13.9 07/23/2016   HCT 43.0 07/23/2016   MCV 89 07/23/2016   PLT 239 07/23/2016   Lab Results  Component Value Date   CREATININE 1.61 (H) 07/23/2016   Lab Results  Component Value Date   TSH 6.330 (H) 07/26/2017      Component Value Date/Time   CHOL 164 07/26/2017 1058   HDL 41 07/26/2017 1058   CHOLHDL 4.0 07/26/2017 1058   LDLCALC 106 (H) 07/26/2017 1058   Lab Results  Component Value Date   AST 18 07/03/2015   Lab Results  Component Value Date   ALT  11 07/03/2015   I have reviewed the labs.  Assessment & Plan:    1. Elevated PSA  - current PSA 7.7 - not taking finasteride  - PSA drawn today     2. BPH with LUTS  - IPSS score is 17/2, it is slightly worse   - Continue doxazosin 8 mg daily  - RTC in 12  months for I PSS, PSA and exam - if PSA is stable  3. Erectile dysfunction:   - Refill given for sildenafil  - RTC in 12 months for repeat SHIM score and exam     Return in about 1 year (around 12/17/2018) for IPSS, PSA and exam.  These notes generated with voice recognition software. I apologize for typographical errors.  Zara Council, PA-C  [facility] [address] [place], [place] [address] [phone]

## 2017-12-16 ENCOUNTER — Encounter: Payer: Self-pay | Admitting: Urology

## 2017-12-16 ENCOUNTER — Other Ambulatory Visit: Payer: Self-pay

## 2017-12-16 ENCOUNTER — Ambulatory Visit (INDEPENDENT_AMBULATORY_CARE_PROVIDER_SITE_OTHER): Payer: PPO | Admitting: Urology

## 2017-12-16 VITALS — BP 134/77 | HR 74 | Wt 189.0 lb

## 2017-12-16 DIAGNOSIS — N529 Male erectile dysfunction, unspecified: Secondary | ICD-10-CM | POA: Diagnosis not present

## 2017-12-16 DIAGNOSIS — N138 Other obstructive and reflux uropathy: Secondary | ICD-10-CM

## 2017-12-16 DIAGNOSIS — R972 Elevated prostate specific antigen [PSA]: Secondary | ICD-10-CM

## 2017-12-16 DIAGNOSIS — N401 Enlarged prostate with lower urinary tract symptoms: Secondary | ICD-10-CM | POA: Diagnosis not present

## 2017-12-16 LAB — BLADDER SCAN AMB NON-IMAGING

## 2017-12-16 MED ORDER — SILDENAFIL CITRATE 20 MG PO TABS: ORAL_TABLET | ORAL | 3 refills | Status: DC

## 2017-12-17 DIAGNOSIS — H401133 Primary open-angle glaucoma, bilateral, severe stage: Secondary | ICD-10-CM | POA: Diagnosis not present

## 2017-12-17 LAB — PSA: PROSTATE SPECIFIC AG, SERUM: 9.3 ng/mL — AB (ref 0.0–4.0)

## 2017-12-20 ENCOUNTER — Telehealth: Payer: Self-pay

## 2017-12-20 NOTE — Telephone Encounter (Signed)
Pt states he would like to proceed with monitoring every 3 months at this time. Please schedule pt for a 3 month lab appointment for psa.

## 2017-12-20 NOTE — Telephone Encounter (Signed)
-----   Message from Nori Riis, PA-C sent at 12/19/2017 10:01 PM EDT ----- Please let Mr. Heatwole know that his PSA has increased to 9.3.  AUA guidelines recommend men 82 years of age and older to undergo a prostate biopsy when the PSA reaches 10.0.  He is very close to this number.  He can certainly undergo a prostate biopsy at this time, but he may choose to shorten the time between PSA monitoring to 3 months.

## 2017-12-21 ENCOUNTER — Telehealth: Payer: Self-pay | Admitting: Urology

## 2017-12-21 NOTE — Telephone Encounter (Signed)
App made and mailed to patient ° °Jacob Nielsen °

## 2017-12-23 DIAGNOSIS — H401113 Primary open-angle glaucoma, right eye, severe stage: Secondary | ICD-10-CM | POA: Diagnosis not present

## 2018-02-07 DIAGNOSIS — L299 Pruritus, unspecified: Secondary | ICD-10-CM | POA: Diagnosis not present

## 2018-02-07 DIAGNOSIS — D224 Melanocytic nevi of scalp and neck: Secondary | ICD-10-CM | POA: Diagnosis not present

## 2018-02-07 DIAGNOSIS — I781 Nevus, non-neoplastic: Secondary | ICD-10-CM | POA: Diagnosis not present

## 2018-02-07 DIAGNOSIS — L820 Inflamed seborrheic keratosis: Secondary | ICD-10-CM | POA: Diagnosis not present

## 2018-02-07 DIAGNOSIS — L570 Actinic keratosis: Secondary | ICD-10-CM | POA: Diagnosis not present

## 2018-02-14 DIAGNOSIS — L299 Pruritus, unspecified: Secondary | ICD-10-CM | POA: Diagnosis not present

## 2018-02-15 ENCOUNTER — Other Ambulatory Visit: Payer: Self-pay | Admitting: Family Medicine

## 2018-02-15 NOTE — Telephone Encounter (Signed)
[facility] faxed refill request for the following medications:  Syringe for B12 injections  Please advise.

## 2018-02-16 DIAGNOSIS — L299 Pruritus, unspecified: Secondary | ICD-10-CM | POA: Diagnosis not present

## 2018-02-18 DIAGNOSIS — L299 Pruritus, unspecified: Secondary | ICD-10-CM | POA: Diagnosis not present

## 2018-02-18 MED ORDER — NEEDLES & SYRINGES MISC
0 refills | Status: DC
Start: 2018-02-18 — End: 2020-07-12

## 2018-02-21 DIAGNOSIS — L299 Pruritus, unspecified: Secondary | ICD-10-CM | POA: Diagnosis not present

## 2018-02-23 DIAGNOSIS — L299 Pruritus, unspecified: Secondary | ICD-10-CM | POA: Diagnosis not present

## 2018-02-25 DIAGNOSIS — L299 Pruritus, unspecified: Secondary | ICD-10-CM | POA: Diagnosis not present

## 2018-02-28 DIAGNOSIS — L299 Pruritus, unspecified: Secondary | ICD-10-CM | POA: Diagnosis not present

## 2018-03-02 DIAGNOSIS — L298 Other pruritus: Secondary | ICD-10-CM | POA: Diagnosis not present

## 2018-03-07 DIAGNOSIS — L299 Pruritus, unspecified: Secondary | ICD-10-CM | POA: Diagnosis not present

## 2018-03-09 DIAGNOSIS — L299 Pruritus, unspecified: Secondary | ICD-10-CM | POA: Diagnosis not present

## 2018-03-14 DIAGNOSIS — L298 Other pruritus: Secondary | ICD-10-CM | POA: Diagnosis not present

## 2018-03-16 DIAGNOSIS — L298 Other pruritus: Secondary | ICD-10-CM | POA: Diagnosis not present

## 2018-03-21 ENCOUNTER — Other Ambulatory Visit: Payer: Self-pay | Admitting: Family Medicine

## 2018-03-21 DIAGNOSIS — R972 Elevated prostate specific antigen [PSA]: Secondary | ICD-10-CM

## 2018-03-23 ENCOUNTER — Other Ambulatory Visit: Payer: PPO

## 2018-03-24 ENCOUNTER — Encounter: Payer: Self-pay | Admitting: Urology

## 2018-03-26 ENCOUNTER — Other Ambulatory Visit: Payer: Self-pay | Admitting: Family Medicine

## 2018-03-30 DIAGNOSIS — L299 Pruritus, unspecified: Secondary | ICD-10-CM | POA: Diagnosis not present

## 2018-04-04 DIAGNOSIS — L299 Pruritus, unspecified: Secondary | ICD-10-CM | POA: Diagnosis not present

## 2018-04-06 DIAGNOSIS — L298 Other pruritus: Secondary | ICD-10-CM | POA: Diagnosis not present

## 2018-04-09 ENCOUNTER — Ambulatory Visit (INDEPENDENT_AMBULATORY_CARE_PROVIDER_SITE_OTHER): Payer: PPO

## 2018-04-09 DIAGNOSIS — Z23 Encounter for immunization: Secondary | ICD-10-CM

## 2018-04-11 DIAGNOSIS — L299 Pruritus, unspecified: Secondary | ICD-10-CM | POA: Diagnosis not present

## 2018-04-13 DIAGNOSIS — L298 Other pruritus: Secondary | ICD-10-CM | POA: Diagnosis not present

## 2018-04-18 ENCOUNTER — Other Ambulatory Visit: Payer: Self-pay

## 2018-04-18 ENCOUNTER — Other Ambulatory Visit: Payer: PPO

## 2018-04-18 DIAGNOSIS — R972 Elevated prostate specific antigen [PSA]: Secondary | ICD-10-CM

## 2018-04-18 DIAGNOSIS — N401 Enlarged prostate with lower urinary tract symptoms: Secondary | ICD-10-CM

## 2018-04-18 DIAGNOSIS — B356 Tinea cruris: Secondary | ICD-10-CM | POA: Diagnosis not present

## 2018-04-18 DIAGNOSIS — L570 Actinic keratosis: Secondary | ICD-10-CM | POA: Diagnosis not present

## 2018-04-18 DIAGNOSIS — L298 Other pruritus: Secondary | ICD-10-CM | POA: Diagnosis not present

## 2018-04-19 ENCOUNTER — Other Ambulatory Visit: Payer: Self-pay

## 2018-04-19 ENCOUNTER — Telehealth: Payer: Self-pay

## 2018-04-19 ENCOUNTER — Ambulatory Visit (INDEPENDENT_AMBULATORY_CARE_PROVIDER_SITE_OTHER): Payer: PPO

## 2018-04-19 VITALS — BP 170/98 | HR 63 | Wt 187.0 lb

## 2018-04-19 DIAGNOSIS — R300 Dysuria: Secondary | ICD-10-CM

## 2018-04-19 LAB — URINALYSIS, COMPLETE
Bilirubin, UA: NEGATIVE
Glucose, UA: NEGATIVE
KETONES UA: NEGATIVE
Leukocytes, UA: NEGATIVE
NITRITE UA: NEGATIVE
Protein, UA: NEGATIVE
RBC UA: NEGATIVE
Specific Gravity, UA: 1.02 (ref 1.005–1.030)
UUROB: 0.2 mg/dL (ref 0.2–1.0)
pH, UA: 6 (ref 5.0–7.5)

## 2018-04-19 LAB — PSA: PROSTATE SPECIFIC AG, SERUM: 7.4 ng/mL — AB (ref 0.0–4.0)

## 2018-04-19 NOTE — Progress Notes (Signed)
Patient presented for the following symptoms urinary frequency, hard to postpone urination, burning/painful urination, back/flank pain, lower abdominal pain.  Antibiotics patient is able to take macrobid and amox/clav.  Urine collected and sent for culture.  Advised patient we would contact him once the results are back with recommendations.

## 2018-04-19 NOTE — Telephone Encounter (Signed)
-----   Message from Nori Riis, PA-C sent at 04/19/2018  7:30 AM EST ----- Please let Mr. Clayburn know that his PSA has come down.   I would suggest repeating the PSA again in 3 months.

## 2018-04-19 NOTE — Telephone Encounter (Signed)
Reviewed patients urine results with Zara Council.  Results were all negative so I informed patient we would contact him once the culture resulted.  Patient agreed.

## 2018-04-19 NOTE — Telephone Encounter (Signed)
Informed patient PSA results decreased.  While speaking with patient is complaining of burning with urination.  Appointment made to discuss issues.

## 2018-04-20 ENCOUNTER — Other Ambulatory Visit: Payer: Self-pay | Admitting: Family Medicine

## 2018-04-22 LAB — CULTURE, URINE COMPREHENSIVE

## 2018-04-25 ENCOUNTER — Ambulatory Visit (INDEPENDENT_AMBULATORY_CARE_PROVIDER_SITE_OTHER): Payer: PPO | Admitting: Urology

## 2018-04-25 ENCOUNTER — Encounter: Payer: Self-pay | Admitting: Urology

## 2018-04-25 VITALS — BP 144/81 | HR 81 | Ht 72.0 in | Wt 187.2 lb

## 2018-04-25 DIAGNOSIS — N4282 Prostatosis syndrome: Secondary | ICD-10-CM | POA: Diagnosis not present

## 2018-04-25 DIAGNOSIS — N138 Other obstructive and reflux uropathy: Secondary | ICD-10-CM | POA: Diagnosis not present

## 2018-04-25 DIAGNOSIS — N529 Male erectile dysfunction, unspecified: Secondary | ICD-10-CM

## 2018-04-25 DIAGNOSIS — R972 Elevated prostate specific antigen [PSA]: Secondary | ICD-10-CM | POA: Diagnosis not present

## 2018-04-25 DIAGNOSIS — R300 Dysuria: Secondary | ICD-10-CM

## 2018-04-25 DIAGNOSIS — N401 Enlarged prostate with lower urinary tract symptoms: Secondary | ICD-10-CM | POA: Diagnosis not present

## 2018-04-25 LAB — MICROSCOPIC EXAMINATION
Epithelial Cells (non renal): NONE SEEN /hpf (ref 0–10)
RBC, UA: NONE SEEN /hpf (ref 0–2)
WBC, UA: NONE SEEN /hpf (ref 0–5)

## 2018-04-25 LAB — URINALYSIS, COMPLETE
Bilirubin, UA: NEGATIVE
GLUCOSE, UA: NEGATIVE
Ketones, UA: NEGATIVE
LEUKOCYTES UA: NEGATIVE
Nitrite, UA: NEGATIVE
PH UA: 5.5 (ref 5.0–7.5)
PROTEIN UA: NEGATIVE
RBC, UA: NEGATIVE
Specific Gravity, UA: 1.025 (ref 1.005–1.030)
UUROB: 0.2 mg/dL (ref 0.2–1.0)

## 2018-04-25 LAB — BLADDER SCAN AMB NON-IMAGING

## 2018-04-25 MED ORDER — AMOXICILLIN-POT CLAVULANATE 875-125 MG PO TABS: 1.0000 | ORAL_TABLET | Freq: Two times a day (BID) | ORAL | 0 refills | Status: DC

## 2018-04-25 NOTE — Progress Notes (Signed)
3:53 PM   Jacob Darlyn Read Sr. [birth year] [id number]  Referring provider: Birdie Sons, [place], [place] [address]  Chief Complaint  Patient presents with  . Follow-up    HPI: Patient is a 84 year old Caucasian male with an elevated PSA, ED, BPH with LUTS and dysuria who presents today for further evaluation.    Dysuria When staff contacted the patient regarding his PSA results on 04/19/2018, he expressed that he was having dysuria.   He was also complaining of urinary frequency, hard to postpone urination, back/flank pain and lower abdominal pain.   UA and urine culture were negative on 04/19/2018.  Today, he is experiencing frequency, urgency, dysuria, nocturia and a weak urinary stream.   His UA today is negative.  His PVR is 18 mL.  He states he was walking in the grocery store about three weeks ago and felt a burning in the head of the penis.  The burning happens with and without urination.  He states it is similar to prostatitis that he has had in the past.   Elevated PSA PSA Trend  6.0 (12) in 2012 - bx negative - on finasteride  3.6 (7.2) in 09/2011  5.4 (10.8) in 04/2012  3.2 (6.4) in 09/2012  3.6 (7.2) in 04/2013  10.2 in 07/2015 - no finasteride  9.4 in 07/2015 - on finasteride  6.4 in 07/2015 - no finasteride  5.0 in 11/2015  7.7 in 11/2016             9.3 in 11/2017  7.4 in 04/2018   BPH WITH LUTS His IPSS score today is 20, which is moderate lower urinary tract symptomatology.  He is mixed with his quality life due to his urinary symptoms.  His PVR is 18 mL.  His previous I PSS score was 17/2.  His previous PVR was 13 mL.  He is currently taking doxazosin 4 mg daily.  He denies any dysuria, hematuria or suprapubic pain.  He also denies any recent fevers, chills, nausea or vomiting.  He does not have a family history of PCa.  IPSS    Row Name 04/25/18 1400         International Prostate Symptom Score   How often have you had  the sensation of not emptying your bladder?  About half the time     How often have you had to urinate less than every two hours?  About half the time     How often have you found you stopped and started again several times when you urinated?  Less than half the time     How often have you found it difficult to postpone urination?  About half the time     How often have you had a weak urinary stream?  More than half the time     How often have you had to strain to start urination?  About half the time     How many times did you typically get up at night to urinate?  2 Times     Total IPSS Score  20       Quality of Life due to urinary symptoms   If you were to spend the rest of your life with your urinary condition just the way it is now how would you feel about that?  Mixed        Score:  1-7 Mild 8-19 Moderate 20-35 Severe  Erectile dysfunction He has  been having difficulty with erections for one year.   His major complaint is lack of firmness.  His libido is preserved.   His risk factors for ED are age, BPH, HTN, HLD and CKD,   He denies any painful erections or curvatures with his erections.   He is still having spontaneous erections.  Sildenafil is effective.   PMH: Past Medical History:  Diagnosis Date  . Arthritis    right hand  . BPH (benign prostatic hyperplasia)   . ED (erectile dysfunction)   . Elevated PSA   . GERD (gastroesophageal reflux disease)    occasional  . Glaucoma   . Gout   . Gout   . History of alcoholism ([place])   . History of chicken pox   . History of measles   . History of mumps   . History of pernicious anemia   . HTN (hypertension)    controlled  . Hypothyroidism   . Wears partial dentures    upper and lower     Surgical History: Past Surgical History:  Procedure Laterality Date  . EYE SURGERY     laser eye surgery on right eye for glaucoma  . PARATHYROIDECTOMY  2004  . PHOTOCOAGULATION WITH LASER Right 10/07/2015   Procedure:  PHOTOCOAGULATION WITH LASER RIGHT EYE TRANS SCLERAL;  Surgeon: Ronnell Freshwater, MD;  Location: [facility];  Service: Ophthalmology;  Laterality: Right;  . TONSILLECTOMY      Home Medications:  Allergies as of 04/25/2018      Reactions   Krystexxa  [pegloticase] Anaphylaxis   Allopurinol    Abdominal pain and itching   Ciprofloxacin    causes mouth to itch   Levofloxacin Other (See Comments)   Tendon pain   Mycophenolate Mofetil    Niacin    Other reaction(s): UNKNOWN   Sulfa Antibiotics Itching   Uloric  [febuxostat]    Other reaction(s): Abdominal pain      Medication List        Accurate as of 04/25/18  3:53 PM. Always use your most recent med list.          amoxicillin-clavulanate 875-125 MG tablet Commonly known as:  AUGMENTIN Take 1 tablet by mouth every 12 (twelve) hours.   brimonidine 0.1 % Soln Commonly known as:  ALPHAGAN P Apply 1 drop to eye 2 (two) times daily. Reported on 07/11/2015   cyanocobalamin 1000 MCG/ML injection Commonly known as:  (VITAMIN B-12) INJECT 1 ML (1,000 MCG TOTAL) INTO THE MUSCLE EVERY 30 (THIRTY) DAYS.   dorzolamide-timolol 22.3-6.8 MG/ML ophthalmic solution Commonly known as:  COSOPT Place 1 drop into both eyes 2 (two) times daily.   doxazosin 8 MG tablet Commonly known as:  CARDURA TAKE 1 TABLET (8 MG TOTAL) BY MOUTH DAILY.   irbesartan 150 MG tablet Commonly known as:  AVAPRO Take 1 tablet (150 mg total) by mouth daily.   latanoprost 0.005 % ophthalmic solution Commonly known as:  XALATAN Place 1 drop into both eyes at bedtime.   levothyroxine 125 MCG tablet Commonly known as:  SYNTHROID, LEVOTHROID Take 1 tablet (125 mcg total) by mouth daily at 6 (six) AM.   Needles & Syringes Misc Use one each month for vitamin b12 injection   probenecid 500 MG tablet Commonly known as:  BENEMID TAKE 0.5 TABLETS (250 MG TOTAL) BY MOUTH TWICE DAILY   sildenafil 20 MG tablet Commonly known as:  REVATIO Take 3  to 5 tablets two hours before intercouse on an empty  stomach.  Do not take with nitrates.       Allergies:  Allergies  Allergen Reactions  . Krystexxa  [Pegloticase] Anaphylaxis  . Allopurinol     Abdominal pain and itching  . Ciprofloxacin     causes mouth to itch  . Levofloxacin Other (See Comments)    Tendon pain  . Mycophenolate Mofetil   . Niacin     Other reaction(s): UNKNOWN  . Sulfa Antibiotics Itching  . Uloric  [Febuxostat]     Other reaction(s): Abdominal pain    Family History: Family History  Problem Relation Age of Onset  . Asthma Mother   . Heart attack Father   . Parkinson's disease Father   . Kidney disease Neg Hx   . Prostate cancer Neg Hx        maybe paternal grandfather ? unsure    Social History:  reports that he quit smoking about 39 years ago. His smoking use included cigarettes. He has a 30.00 pack-year smoking history. He has never used smokeless tobacco. He reports that he does not drink alcohol or use drugs.  ROS: UROLOGY Frequent Urination?: Yes Hard to postpone urination?: Yes Burning/pain with urination?: Yes Get up at night to urinate?: Yes Leakage of urine?: No Urine stream starts and stops?: No Trouble starting stream?: No Do you have to strain to urinate?: No Blood in urine?: No Urinary tract infection?: No Sexually transmitted disease?: No Injury to kidneys or bladder?: No Painful intercourse?: No Weak stream?: Yes Erection problems?: No Penile pain?: No  Gastrointestinal Nausea?: No Vomiting?: No Indigestion/heartburn?: No Diarrhea?: No Constipation?: No  Constitutional Fever: No Night sweats?: No Weight loss?: No Fatigue?: No  Skin Skin rash/lesions?: No Itching?: Yes  Eyes Blurred vision?: No Double vision?: No  Ears/Nose/Throat Sore throat?: No Sinus problems?: No  Hematologic/Lymphatic Swollen glands?: No Easy bruising?: No  Cardiovascular Leg swelling?: No Chest pain?:  No  Respiratory Cough?: No Shortness of breath?: No  Endocrine Excessive thirst?: No  Musculoskeletal Back pain?: No Joint pain?: No  Neurological Headaches?: No Dizziness?: No  Psychologic Depression?: No Anxiety?: No  Physical Exam: BP (!) 144/81 (BP Location: Left Arm, Patient Position: Sitting, Cuff Size: Normal)   Pulse 81   Ht 6' (1.829 m)   Wt 187 lb 3.2 oz (84.9 kg)   BMI 25.39 kg/m   Constitutional: Well nourished. Alert and oriented, No acute distress. HEENT: [place] AT, moist mucus membranes. Trachea midline, no masses. Cardiovascular: No clubbing, cyanosis, or edema. Respiratory: Normal respiratory effort, no increased work of breathing. GI: Abdomen is soft, non tender, non distended, no abdominal masses. Liver and spleen not palpable.  No hernias appreciated.  Stool sample for occult testing is not indicated.   GU: No CVA tenderness.  No bladder fullness or masses.  Patient with circumcised phallus.  Urethral meatus is patent.  No penile discharge. No penile lesions or rashes. Scrotum without lesions, cysts, rashes and/or edema.  Testicles are located scrotally bilaterally. No masses are appreciated in the testicles. Left and right epididymis are normal. Rectal: Patient with  normal sphincter tone. Anus and perineum without scarring or rashes. No rectal masses are appreciated. Prostate is approximately 60 + grams, tender, could only palate the apex and midportion of the gland, no nodules are appreciated.  Skin: No rashes, bruises or suspicious lesions. Lymph: No cervical or inguinal adenopathy. Neurologic: Grossly intact, no focal deficits, moving all 4 extremities. Psychiatric: Normal mood and affect.   Laboratory Data:   Lab Results  Component Value Date   WBC 7.5 07/23/2016   HGB 13.9 07/23/2016   HCT 43.0 07/23/2016   MCV 89 07/23/2016   PLT 239 07/23/2016   Lab Results  Component Value Date   CREATININE 1.61 (H) 07/23/2016   Lab Results   Component Value Date   TSH 6.330 (H) 07/26/2017      Component Value Date/Time   CHOL 164 07/26/2017 1058   HDL 41 07/26/2017 1058   CHOLHDL 4.0 07/26/2017 1058   LDLCALC 106 (H) 07/26/2017 1058   Lab Results  Component Value Date   AST 18 07/03/2015   Lab Results  Component Value Date   ALT 11 07/03/2015   I have reviewed the labs.  Assessment & Plan:    1. Prostatitis Previous UA and urine cultures were negative Prostate tender on exam Will start Augmentin x 30 days RTC in three months for recheck   2. Elevated PSA Current PSA 7.4 - will start taking finasteride Will recheck PSA when he returns in 3 months    3. BPH with LUTS IPSS score is 20/3 it is slightly worse  Continue doxazosin 4 mg daily Restart finasteride 5 mg daily RTC in 3 months for I PSS, PSA and PVR  4. Erectile dysfunction:  RTC in 12 months for repeat SHIM score and exam   Return in about 3 months (around 07/26/2018) for IPSS, PSA and exam.  These notes generated with voice recognition software. I apologize for typographical errors.  Zara Council, PA-C  [facility] [address] [place], [place] [address] [phone]

## 2018-05-04 ENCOUNTER — Other Ambulatory Visit: Payer: Self-pay | Admitting: Family Medicine

## 2018-05-05 ENCOUNTER — Other Ambulatory Visit: Payer: Self-pay | Admitting: Family Medicine

## 2018-05-05 MED ORDER — LEVOTHYROXINE SODIUM 125 MCG PO TABS: 125.0000 ug | ORAL_TABLET | Freq: Every day | ORAL | 4 refills | Status: DC

## 2018-05-05 NOTE — Telephone Encounter (Signed)
Please review. Thanks!  

## 2018-05-05 NOTE — Telephone Encounter (Signed)
[facility] faxed refill request for the following medications:  levothyroxine (SYNTHROID, LEVOTHROID) 125 MCG tablet  90 day supply  Last Rx: 04/20/17 LOV: 07/26/17 Please advise. Thanks TNP

## 2018-06-24 ENCOUNTER — Ambulatory Visit: Payer: Self-pay

## 2018-06-29 ENCOUNTER — Ambulatory Visit (INDEPENDENT_AMBULATORY_CARE_PROVIDER_SITE_OTHER): Payer: PPO

## 2018-06-29 VITALS — BP 118/74 | HR 66 | Temp 97.7°F | Ht 72.0 in | Wt 190.4 lb

## 2018-06-29 DIAGNOSIS — Z0000 Encounter for general adult medical examination without abnormal findings: Secondary | ICD-10-CM

## 2018-06-29 NOTE — Progress Notes (Signed)
Subjective:   Jacob GARRIT MARROW Sr. is a 85 y.o. male who presents for Medicare Annual/Subsequent preventive examination.  Review of Systems:  N/A  Cardiac Risk Factors include: advanced age (>53men, >65 women);male gender;dyslipidemia;hypertension     Objective:    Vitals: BP 118/74 (BP Location: Left Arm)   Pulse 66   Temp 97.7 F (36.5 C) (Oral)   Ht 6' (1.829 m)   Wt 190 lb 6.4 oz (86.4 kg)   BMI 25.82 kg/m   Body mass index is 25.82 kg/m.  Advanced Directives 06/29/2018 06/24/2017 05/27/2016 10/07/2015  Does Patient Have a Medical Advance Directive? Yes Yes Yes Yes  Type of Paramedic of [place];Living will Living will Living will;Healthcare Power of [place];Living will  Does patient want to make changes to medical advance directive? - - - No - Patient declined  Copy of [place] in Chart? No - copy requested - Yes No - copy requested    Tobacco Social History   Tobacco Use  Smoking Status Former Smoker  . Packs/day: 1.50  . Years: 20.00  . Pack years: 30.00  . Types: Cigarettes  . Last attempt to quit: 06/01/1978  . Years since quitting: 40.1  Smokeless Tobacco Never Used     Counseling given: Not Answered   Clinical Intake:  Pre-visit preparation completed: Yes  Pain : No/denies pain Pain Score: 0-No pain     Nutritional Status: BMI 25 -29 Overweight Nutritional Risks: None Diabetes: No  How often do you need to have someone help you when you read instructions, pamphlets, or other written materials from your doctor or pharmacy?: 1 - Never  Interpreter Needed?: No  Information entered by :: Kahi Mohala, LPN  Past Medical History:  Diagnosis Date  . Arthritis    right hand  . BPH (benign prostatic hyperplasia)   . ED (erectile dysfunction)   . Elevated PSA   . GERD (gastroesophageal reflux disease)    occasional  . Glaucoma   . Gout   . Gout   . History of alcoholism ([place])     . History of chicken pox   . History of measles   . History of mumps   . History of pernicious anemia   . HTN (hypertension)    controlled  . Hypothyroidism   . Wears partial dentures    upper and lower    Past Surgical History:  Procedure Laterality Date  . EYE SURGERY     laser eye surgery on right eye for glaucoma  . PARATHYROIDECTOMY  2004  . PHOTOCOAGULATION WITH LASER Right 10/07/2015   Procedure: PHOTOCOAGULATION WITH LASER RIGHT EYE TRANS SCLERAL;  Surgeon: Ronnell Freshwater, MD;  Location: [facility];  Service: Ophthalmology;  Laterality: Right;  . TONSILLECTOMY     Family History  Problem Relation Age of Onset  . Asthma Mother   . Heart attack Father   . Parkinson's disease Father   . Kidney disease Neg Hx   . Prostate cancer Neg Hx        maybe paternal grandfather ? unsure   Social History   Socioeconomic History  . Marital status: Married    Spouse name: Jacob Nielsen  . Number of children: 2  . Years of education: Not on file  . Highest education level: Some college, no degree  Occupational History  . Occupation: Retired    Comment: Previously worked at a [employer]  .  Financial resource strain: Not hard at all  . Food insecurity:    Worry: Never true    Inability: Never true  . Transportation needs:    Medical: No    Non-medical: No  Tobacco Use  . Smoking status: Former Smoker    Packs/day: 1.50    Years: 20.00    Pack years: 30.00    Types: Cigarettes    Last attempt to quit: 06/01/1978    Years since quitting: 40.1  . Smokeless tobacco: Never Used  Substance and Sexual Activity  . Alcohol use: No    Alcohol/week: 0.0 standard drinks    Comment: former Alcoholic, meets with AA once a week. Has been alcohol free for 34 years  . Drug use: No  . Sexual activity: Not on file  Lifestyle  . Physical activity:    Days per week: 0 days    Minutes per session: 0 min  . Stress: Not at all  Relationships  . Social  connections:    Talks on phone: Patient refused    Gets together: Patient refused    Attends religious service: Patient refused    Active member of club or organization: Patient refused    Attends meetings of clubs or organizations: Patient refused    Relationship status: Patient refused  Other Topics Concern  . Not on file  Social History Narrative  . Not on file    Outpatient Encounter Medications as of 06/29/2018  Medication Sig  . brimonidine (ALPHAGAN P) 0.1 % SOLN Apply 1 drop to eye 2 (two) times daily. Reported on 07/11/2015  . cyanocobalamin (,VITAMIN B-12,) 1000 MCG/ML injection INJECT 1 ML (1,000 MCG TOTAL) INTO THE MUSCLE EVERY 30 (THIRTY) DAYS.  Marland Kitchen dorzolamide-timolol (COSOPT) 22.3-6.8 MG/ML ophthalmic solution Place 1 drop into both eyes 2 (two) times daily.   Marland Kitchen doxazosin (CARDURA) 8 MG tablet TAKE 1 TABLET (8 MG TOTAL) BY MOUTH DAILY. (Patient taking differently: Take 4 mg by mouth. TAKE 1 TABLET (4 MG TOTAL) BY MOUTH DAILY.)  . irbesartan (AVAPRO) 150 MG tablet TAKE 1 TABLET BY MOUTH EVERY DAY  . latanoprost (XALATAN) 0.005 % ophthalmic solution Place 1 drop into both eyes at bedtime.   Marland Kitchen levothyroxine (SYNTHROID, LEVOTHROID) 125 MCG tablet Take 1 tablet (125 mcg total) by mouth daily at 6 (six) AM.  . Needles & Syringes MISC Use one each month for vitamin b12 injection  . probenecid (BENEMID) 500 MG tablet TAKE 0.5 TABLETS (250 MG TOTAL) BY MOUTH TWICE DAILY (Patient taking differently: Take 250 mg by mouth daily. TAKE 0.5 TABLETS (250 MG TOTAL) BY MOUTH TWICE DAILY)  . amoxicillin-clavulanate (AUGMENTIN) 875-125 MG tablet Take 1 tablet by mouth every 12 (twelve) hours. (Patient not taking: Reported on 06/29/2018)  . sildenafil (REVATIO) 20 MG tablet Take 3 to 5 tablets two hours before intercouse on an empty stomach.  Do not take with nitrates. (Patient not taking: Reported on 04/25/2018)   No facility-administered encounter medications on file as of 06/29/2018.      Activities of Daily Living In your present state of health, do you have any difficulty performing the following activities: 06/29/2018  Hearing? N  Vision? Y  Comment Has loss of vision in right eye and cataract on left. Wears eye glasses.   Difficulty concentrating or making decisions? N  Walking or climbing stairs? N  Dressing or bathing? N  Doing errands, shopping? N  Preparing Food and eating ? N  Using the Toilet? N  In the past  six months, have you accidently leaked urine? N  Do you have problems with loss of bowel control? N  Managing your Medications? N  Managing your Finances? N  Housekeeping or managing your Housekeeping? N  Some recent data might be hidden    Patient Care Team: Birdie Sons, MD as PCP - General (Family Medicine) Regino Schultze, MD as Referring Physician (Dermatology) Leandrew Koyanagi, MD as Referring Physician (Ophthalmology) Emmaline Kluver., MD as Consulting Physician (Rheumatology)   Assessment:   This is a routine wellness examination for Geramy.  Exercise Activities and Dietary recommendations Current Exercise Habits: Home exercise routine, Type of exercise: strength training/weights;walking, Time (Minutes): 30(Plus an additional 15 minutes EOD (weights)), Frequency (Times/Week): 7, Weekly Exercise (Minutes/Week): 210, Intensity: Mild  Goals    . DIET - INCREASE WATER INTAKE     Recommend increasing water intake to 6-8 glasses a day.     . Increase water intake     Starting 05/27/16, I will increase my water intake to 5 glasses a day.    . Reduce caffeine intake     Recommend to cut back on caffinated beverages and increase water consumption.        Fall Risk Fall Risk  06/29/2018 06/24/2017 05/27/2016 04/08/2015  Falls in the past year? 0 Yes No No  Number falls in past yr: - 2 or more - -  Comment - tripped x 2 - -  Injury with Fall? - No - -  Follow up - Falls prevention discussed - -   FALL RISK PREVENTION  PERTAINING TO THE HOME:  Any stairs in or around the home WITH handrails? Yes  Home free of loose throw rugs in walkways, pet beds, electrical cords, etc? Yes  Adequate lighting in your home to reduce risk of falls? Yes   ASSISTIVE DEVICES UTILIZED TO PREVENT FALLS:  Life alert? No  Use of a cane, walker or w/c? No  Grab bars in the bathroom? Yes  Shower chair or bench in shower? Yes  Elevated toilet seat or a handicapped toilet? Yes    TIMED UP AND GO:  Was the test performed? No .     Depression Screen PHQ 2/9 Scores 06/29/2018 06/29/2018 06/24/2017 06/24/2017  PHQ - 2 Score 0 0 0 0  PHQ- 9 Score 0 - 0 -    Cognitive Function     6CIT Screen 06/29/2018 05/27/2016  What Year? 0 points 0 points  What month? 0 points 0 points  What time? 0 points 0 points  Count back from 20 0 points 0 points  Months in reverse 0 points 2 points  Repeat phrase 0 points 2 points  Total Score 0 4    Immunization History  Administered Date(s) Administered  . Influenza, High Dose Seasonal PF 03/08/2015, 03/07/2016, 03/18/2017, 04/09/2018  . Influenza,inj,Quad PF,6+ Mos 03/28/2013, 02/14/2014  . Influenza-Unspecified 01/30/2014  . Pneumococcal Conjugate-13 02/14/2014  . Pneumococcal Polysaccharide-23 06/01/2008  . Tdap 10/18/2012  . Zoster 09/11/2014    Qualifies for Shingles Vaccine? Yes  Zostavax completed 09/11/14. Due for Shingrix. Education has been provided regarding the importance of this vaccine. Pt has been advised to call insurance company to determine out of pocket expense. Advised may also receive vaccine at local pharmacy or Health Dept. Verbalized acceptance and understanding.  Tdap: Up to date  Flu Vaccine: Up to date  Pneumococcal Vaccine: Up to date   Screening Tests Health Maintenance  Topic Date Due  . TETANUS/TDAP  10/19/2022  .  INFLUENZA VACCINE  Completed  . PNA vac Low Risk Adult  Completed   Cancer Screenings:  Colorectal Screening: No longer required.    Lung Cancer Screening: (Low Dose CT Chest recommended if Age 46-80 years, 30 pack-year currently smoking OR have quit w/in 15years.) does not qualify.    Additional Screening:  Vision Screening: Recommended annual ophthalmology exams for early detection of glaucoma and other disorders of the eye.  Dental Screening: Recommended annual dental exams for proper oral hygiene  Community Resource Referral:  CRR required this visit?  No        Plan:  I have personally reviewed and addressed the Medicare Annual Wellness questionnaire and have noted the following in the patient's chart:  A. Medical and social history B. Use of alcohol, tobacco or illicit drugs  C. Current medications and supplements D. Functional ability and status E.  Nutritional status F.  Physical activity G. Advance directives H. List of other physicians I.  Hospitalizations, surgeries, and ER visits in previous 12 months J.  [place] such as hearing and vision if needed, cognitive and depression L. Referrals and appointments - none  In addition, I have reviewed and discussed with patient certain preventive protocols, quality metrics, and best practice recommendations. A written personalized care plan for preventive services as well as general preventive health recommendations were provided to patient.  See attached scanned questionnaire for additional information.   Signed,  Fabio Neighbors, LPN Nurse Health Advisor   Nurse Recommendations: None.

## 2018-06-29 NOTE — Patient Instructions (Addendum)
[name] , Thank you for taking time to come for your Medicare Wellness Visit. I appreciate your ongoing commitment to your health goals. Please review the following plan we discussed and let me know if I can assist you in the future.   Screening recommendations/referrals: Colonoscopy: No longer required.  Recommended yearly ophthalmology/optometry visit for glaucoma screening and checkup Recommended yearly dental visit for hygiene and checkup  Vaccinations: Influenza vaccine: Up to date Pneumococcal vaccine: Completed series Tdap vaccine: Up to date Shingles vaccine: Pt declines today.     Advanced directives: Please bring a copy of your POA (Power of Attorney) and/or Living Will to your next appointment.   Conditions/risks identified: Recommend to cut back on caffinated beverages and increase water consumption.   Next appointment: 08/16/18 @ 2:00 PM with Dr Caryn Section.   Preventive Care 83 Years and Older, Male Preventive care refers to lifestyle choices and visits with your health care provider that can promote health and wellness. What does preventive care include?  A yearly physical exam. This is also called an annual well check.  Dental exams once or twice a year.  Routine eye exams. Ask your health care provider how often you should have your eyes checked.  Personal lifestyle choices, including:  Daily care of your teeth and gums.  Regular physical activity.  Eating a healthy diet.  Avoiding tobacco and drug use.  Limiting alcohol use.  Practicing safe sex.  Taking low doses of aspirin every day.  Taking vitamin and mineral supplements as recommended by your health care provider. What happens during an annual well check? The services and screenings done by your health care provider during your annual well check will depend on your age, overall health, lifestyle risk factors, and family history of disease. Counseling  Your health care provider may ask you  questions about your:  Alcohol use.  Tobacco use.  Drug use.  Emotional well-being.  Home and relationship well-being.  Sexual activity.  Eating habits.  History of falls.  Memory and ability to understand (cognition).  Work and work Statistician. Screening  You may have the following tests or measurements:  Height, weight, and BMI.  Blood pressure.  Lipid and cholesterol levels. These may be checked every 5 years, or more frequently if you are over 65 years old.  Skin check.  Lung cancer screening. You may have this screening every year starting at age 81 if you have a 30-pack-year history of smoking and currently smoke or have quit within the past 15 years.  Fecal occult blood test (FOBT) of the stool. You may have this test every year starting at age 7.  Flexible sigmoidoscopy or colonoscopy. You may have a sigmoidoscopy every 5 years or a colonoscopy every 10 years starting at age 51.  Prostate cancer screening. Recommendations will vary depending on your family history and other risks.  Hepatitis C blood test.  Hepatitis B blood test.  Sexually transmitted disease (STD) testing.  Diabetes screening. This is done by checking your blood sugar (glucose) after you have not eaten for a while (fasting). You may have this done every 1-3 years.  Abdominal aortic aneurysm (AAA) screening. You may need this if you are a current or former smoker.  Osteoporosis. You may be screened starting at age 89 if you are at high risk. Talk with your health care provider about your test results, treatment options, and if necessary, the need for more tests. Vaccines  Your health care provider may recommend certain vaccines,  such as:  Influenza vaccine. This is recommended every year.  Tetanus, diphtheria, and acellular pertussis (Tdap, Td) vaccine. You may need a Td booster every 10 years.  Zoster vaccine. You may need this after age 33.  Pneumococcal 13-valent conjugate  (PCV13) vaccine. One dose is recommended after age 21.  Pneumococcal polysaccharide (PPSV23) vaccine. One dose is recommended after age 107. Talk to your health care provider about which screenings and vaccines you need and how often you need them. This information is not intended to replace advice given to you by your health care provider. Make sure you discuss any questions you have with your health care provider. Document Released: 06/14/2015 Document Revised: 02/05/2016 Document Reviewed: 03/19/2015 Elsevier Interactive Patient Education  2017 [place] Prevention in the Home Falls can cause injuries. They can happen to people of all ages. There are many things you can do to make your home safe and to help prevent falls. What can I do on the outside of my home?  Regularly fix the edges of walkways and driveways and fix any cracks.  Remove anything that might make you trip as you walk through a door, such as a raised step or threshold.  Trim any bushes or trees on the path to your home.  Use bright outdoor lighting.  Clear any walking paths of anything that might make someone trip, such as rocks or tools.  Regularly check to see if handrails are loose or broken. Make sure that both sides of any steps have handrails.  Any raised decks and porches should have guardrails on the edges.  Have any leaves, snow, or ice cleared regularly.  Use sand or salt on walking paths during winter.  Clean up any spills in your garage right away. This includes oil or grease spills. What can I do in the bathroom?  Use night lights.  Install grab bars by the toilet and in the tub and shower. Do not use towel bars as grab bars.  Use non-skid mats or decals in the tub or shower.  If you need to sit down in the shower, use a plastic, non-slip stool.  Keep the floor dry. Clean up any water that spills on the floor as soon as it happens.  Remove soap buildup in the tub or shower  regularly.  Attach bath mats securely with double-sided non-slip rug tape.  Do not have throw rugs and other things on the floor that can make you trip. What can I do in the bedroom?  Use night lights.  Make sure that you have a light by your bed that is easy to reach.  Do not use any sheets or blankets that are too big for your bed. They should not hang down onto the floor.  Have a firm chair that has side arms. You can use this for support while you get dressed.  Do not have throw rugs and other things on the floor that can make you trip. What can I do in the kitchen?  Clean up any spills right away.  Avoid walking on wet floors.  Keep items that you use a lot in easy-to-reach places.  If you need to reach something above you, use a strong step stool that has a grab bar.  Keep electrical cords out of the way.  Do not use floor polish or wax that makes floors slippery. If you must use wax, use non-skid floor wax.  Do not have throw rugs and other things on  the floor that can make you trip. What can I do with my stairs?  Do not leave any items on the stairs.  Make sure that there are handrails on both sides of the stairs and use them. Fix handrails that are broken or loose. Make sure that handrails are as long as the stairways.  Check any carpeting to make sure that it is firmly attached to the stairs. Fix any carpet that is loose or worn.  Avoid having throw rugs at the top or bottom of the stairs. If you do have throw rugs, attach them to the floor with carpet tape.  Make sure that you have a light switch at the top of the stairs and the bottom of the stairs. If you do not have them, ask someone to add them for you. What else can I do to help prevent falls?  Wear shoes that:  Do not have high heels.  Have rubber bottoms.  Are comfortable and fit you well.  Are closed at the toe. Do not wear sandals.  If you use a stepladder:  Make sure that it is fully  opened. Do not climb a closed stepladder.  Make sure that both sides of the stepladder are locked into place.  Ask someone to hold it for you, if possible.  Clearly mark and make sure that you can see:  Any grab bars or handrails.  First and last steps.  Where the edge of each step is.  Use tools that help you move around (mobility aids) if they are needed. These include:  Canes.  Walkers.  Scooters.  Crutches.  Turn on the lights when you go into a dark area. Replace any light bulbs as soon as they burn out.  Set up your furniture so you have a clear path. Avoid moving your furniture around.  If any of your floors are uneven, fix them.  If there are any pets around you, be aware of where they are.  Review your medicines with your doctor. Some medicines can make you feel dizzy. This can increase your chance of falling. Ask your doctor what other things that you can do to help prevent falls. This information is not intended to replace advice given to you by your health care provider. Make sure you discuss any questions you have with your health care provider. Document Released: 03/14/2009 Document Revised: 10/24/2015 Document Reviewed: 06/22/2014 Elsevier Interactive Patient Education  2017 [employer].

## 2018-07-11 DIAGNOSIS — L299 Pruritus, unspecified: Secondary | ICD-10-CM | POA: Diagnosis not present

## 2018-07-15 DIAGNOSIS — L299 Pruritus, unspecified: Secondary | ICD-10-CM | POA: Diagnosis not present

## 2018-07-18 DIAGNOSIS — L299 Pruritus, unspecified: Secondary | ICD-10-CM | POA: Diagnosis not present

## 2018-07-20 ENCOUNTER — Other Ambulatory Visit: Payer: Self-pay | Admitting: Family Medicine

## 2018-07-22 ENCOUNTER — Other Ambulatory Visit: Payer: PPO

## 2018-07-22 DIAGNOSIS — R972 Elevated prostate specific antigen [PSA]: Secondary | ICD-10-CM

## 2018-07-22 DIAGNOSIS — L299 Pruritus, unspecified: Secondary | ICD-10-CM | POA: Diagnosis not present

## 2018-07-23 LAB — PSA: Prostate Specific Ag, Serum: 7.3 ng/mL — ABNORMAL HIGH (ref 0.0–4.0)

## 2018-07-25 DIAGNOSIS — L299 Pruritus, unspecified: Secondary | ICD-10-CM | POA: Diagnosis not present

## 2018-07-26 ENCOUNTER — Telehealth: Payer: Self-pay | Admitting: Urology

## 2018-07-26 ENCOUNTER — Encounter: Payer: Self-pay | Admitting: Urology

## 2018-07-26 ENCOUNTER — Ambulatory Visit: Payer: PPO | Admitting: Urology

## 2018-07-26 VITALS — BP 149/85 | HR 74 | Ht 72.0 in | Wt 190.0 lb

## 2018-07-26 DIAGNOSIS — N138 Other obstructive and reflux uropathy: Secondary | ICD-10-CM

## 2018-07-26 DIAGNOSIS — R972 Elevated prostate specific antigen [PSA]: Secondary | ICD-10-CM | POA: Diagnosis not present

## 2018-07-26 DIAGNOSIS — N401 Enlarged prostate with lower urinary tract symptoms: Secondary | ICD-10-CM | POA: Diagnosis not present

## 2018-07-26 DIAGNOSIS — N529 Male erectile dysfunction, unspecified: Secondary | ICD-10-CM | POA: Diagnosis not present

## 2018-07-26 LAB — BLADDER SCAN AMB NON-IMAGING

## 2018-07-26 MED ORDER — SILDENAFIL CITRATE 20 MG PO TABS: ORAL_TABLET | ORAL | 3 refills | Status: AC

## 2018-07-26 MED ORDER — TAMSULOSIN HCL 0.4 MG PO CAPS: 0.4000 mg | ORAL_CAPSULE | Freq: Every day | ORAL | 3 refills | Status: DC

## 2018-07-26 NOTE — Telephone Encounter (Signed)
Patient reports he in not taking the tamsulosin anymore. Only taking Cardura. Tamsulosin removed from medication list.

## 2018-07-26 NOTE — Telephone Encounter (Signed)
I prescribed the tamsulosin today.  I want him to stop the doxazosin and start the tamsulosin.

## 2018-07-26 NOTE — Progress Notes (Signed)
07/26/2018 10:16 AM   Jacob Darlyn Read Sr. [birth year] [id number]  Referring provider: Birdie Sons, MD [address] [place], [place] [address]  Chief Complaint  Patient presents with  . Elevated PSA  . Benign Prostatic Hypertrophy    HPI: Jacob CANAL Sr. is a 85 y.o. male Caucasian with an elevated PSA, ED and BPH with LUTS who presents today for a three month follow up.  Prostatitis Patient denies today (07/26/2018) any dysuria and his prostatetitis has resolved.  Elevated PSA PSA Trend  6.0 (12) in 2012 - bx negative - on finasteride  3.6 (7.2) in 09/2011  5.4 (10.8) in 04/2012  3.2 (6.4) in 09/2012  3.6 (7.2) in 04/2013  10.2 in 07/2015 - no finasteride  9.4 in 07/2015 - on finasteride  6.4 in 07/2015 - no finasteride  5.0 in 11/2015  7.7 in 11/2016             9.3 in 11/2017  7.4 in 04/2018 - no finasteride  7.3 in 07/2018 - no finasteride, patient had to stop 06/2018 due to itching; will no longer attempt finasteride  He does not have a family history of prostate cancer.    BPH WITH LUTS  (prostate and/or bladder) IPSS score: 21/2  PVR: 11 mL   Previous score: 20/3   Previous PVR: 18 mL  Major complaint(s): Weak stream, which is worst in the morning and better as the day continues.  Denies any dysuria, hematuria or suprapubic pain.   Currently taking: doxazosin 4 mg.  Patient is interested in trying tamsulosin, will switch medications.    Denies any recent fevers, chills, nausea or vomiting.   IPSS    Row Name 07/26/18 0900         International Prostate Symptom Score   How often have you had the sensation of not emptying your bladder?  About half the time     How often have you had to urinate less than every two hours?  About half the time     How often have you found you stopped and started again several times when you urinated?  About half the time     How often have you found it difficult to postpone urination?  More than half the time      How often have you had a weak urinary stream?  About half the time     How often have you had to strain to start urination?  More than half the time     How many times did you typically get up at night to urinate?  1 Time     Total IPSS Score  21       Quality of Life due to urinary symptoms   If you were to spend the rest of your life with your urinary condition just the way it is now how would you feel about that?  Mostly Satisfied       Score:  1-7 Mild 8-19 Moderate 20-35 Severe  Erectile dysfunction He has been having difficulty with erections for one year.  His major complaint is lack of firmness.  His libido is preserved.   His risk factors for ED are age, BPH, HTN, HLD, and CKD.  He denies any painful erections or curvatures with his erections.   He is still having spontaneous erections.  He has tried sildenafil and found it effective, but needs a refill at this moment.  PMH: Past Medical History:  Diagnosis Date  . Arthritis    right hand  . BPH (benign prostatic hyperplasia)   . ED (erectile dysfunction)   . Elevated PSA   . GERD (gastroesophageal reflux disease)    occasional  . Glaucoma   . Gout   . Gout   . History of alcoholism ([place])   . History of chicken pox   . History of measles   . History of mumps   . History of pernicious anemia   . HTN (hypertension)    controlled  . Hypothyroidism   . Wears partial dentures    upper and lower     Surgical History: Past Surgical History:  Procedure Laterality Date  . EYE SURGERY     laser eye surgery on right eye for glaucoma  . PARATHYROIDECTOMY  2004  . PHOTOCOAGULATION WITH LASER Right 10/07/2015   Procedure: PHOTOCOAGULATION WITH LASER RIGHT EYE TRANS SCLERAL;  Surgeon: Ronnell Freshwater, MD;  Location: [facility];  Service: Ophthalmology;  Laterality: Right;  . TONSILLECTOMY      Home Medications:  Allergies as of 07/26/2018      Reactions   Krystexxa  [pegloticase] Anaphylaxis    Allopurinol    Abdominal pain and itching   Ciprofloxacin    causes mouth to itch   Levofloxacin Other (See Comments)   Tendon pain   Mycophenolate Mofetil    Niacin    Other reaction(s): UNKNOWN   Sulfa Antibiotics Itching   Uloric  [febuxostat]    Other reaction(s): Abdominal pain   Finasteride Itching      Medication List       Accurate as of July 26, 2018 10:16 AM. Always use your most recent med list.        amoxicillin-clavulanate 875-125 MG tablet Commonly known as:  AUGMENTIN Take 1 tablet by mouth every 12 (twelve) hours.   brimonidine 0.1 % Soln Commonly known as:  ALPHAGAN P Apply 1 drop to eye 2 (two) times daily. Reported on 07/11/2015   colchicine 0.6 MG tablet TAKE 1 TABLET BY MOUTH EVERY DAY AS NEEDED FOR GOUT   cyanocobalamin 1000 MCG/ML injection Commonly known as:  (VITAMIN B-12) INJECT 1 ML (1,000 MCG TOTAL) INTO THE MUSCLE EVERY 30 (THIRTY) DAYS.   dorzolamide-timolol 22.3-6.8 MG/ML ophthalmic solution Commonly known as:  COSOPT Place 1 drop into both eyes 2 (two) times daily.   doxazosin 8 MG tablet Commonly known as:  CARDURA TAKE 1 TABLET (8 MG TOTAL) BY MOUTH DAILY.   gabapentin 300 MG capsule Commonly known as:  NEURONTIN   hydrocortisone 2.5 % ointment APPLY TO ITCHING IN GROIN TWICE DAILY AS NEEDED   irbesartan 150 MG tablet Commonly known as:  AVAPRO TAKE 1 TABLET BY MOUTH EVERY DAY   latanoprost 0.005 % ophthalmic solution Commonly known as:  XALATAN Place 1 drop into both eyes at bedtime.   levothyroxine 125 MCG tablet Commonly known as:  SYNTHROID, LEVOTHROID Take 1 tablet (125 mcg total) by mouth daily at 6 (six) AM.   Needles & Syringes Misc Use one each month for vitamin b12 injection   probenecid 500 MG tablet Commonly known as:  BENEMID TAKE 0.5 TABLETS (250 MG TOTAL) BY MOUTH TWICE DAILY   sildenafil 20 MG tablet Commonly known as:  REVATIO Take 3 to 5 tablets two hours before intercouse on an empty  stomach.  Do not take with nitrates.   tamsulosin 0.4 MG Caps capsule Commonly known as:  FLOMAX Take 1 capsule (0.4 mg  total) by mouth daily.       Allergies:  Allergies  Allergen Reactions  . Krystexxa  [Pegloticase] Anaphylaxis  . Allopurinol     Abdominal pain and itching  . Ciprofloxacin     causes mouth to itch  . Levofloxacin Other (See Comments)    Tendon pain  . Mycophenolate Mofetil   . Niacin     Other reaction(s): UNKNOWN  . Sulfa Antibiotics Itching  . Uloric  [Febuxostat]     Other reaction(s): Abdominal pain  . Finasteride Itching    Family History: Family History  Problem Relation Age of Onset  . Asthma Mother   . Heart attack Father   . Parkinson's disease Father   . Kidney disease Neg Hx   . Prostate cancer Neg Hx        maybe paternal grandfather ? unsure    Social History:  reports that he quit smoking about 40 years ago. His smoking use included cigarettes. He has a 30.00 pack-year smoking history. He has never used smokeless tobacco. He reports that he does not drink alcohol or use drugs.  ROS: UROLOGY Frequent Urination?: No Hard to postpone urination?: No Burning/pain with urination?: No Get up at night to urinate?: No Leakage of urine?: No Urine stream starts and stops?: Yes Trouble starting stream?: No Do you have to strain to urinate?: No Blood in urine?: No Urinary tract infection?: No Sexually transmitted disease?: No Injury to kidneys or bladder?: No Painful intercourse?: No Weak stream?: Yes Erection problems?: No Penile pain?: No  Gastrointestinal Nausea?: No Vomiting?: No Indigestion/heartburn?: No Diarrhea?: No Constipation?: No  Constitutional Fever: No Night sweats?: No Weight loss?: No Fatigue?: No  Skin Skin rash/lesions?: No Itching?: Yes  Eyes Blurred vision?: No Double vision?: No  Ears/Nose/Throat Sore throat?: No Sinus problems?: No  Hematologic/Lymphatic Swollen glands?: No Easy  bruising?: No  Cardiovascular Leg swelling?: No Chest pain?: No  Respiratory Cough?: No Shortness of breath?: No  Endocrine Excessive thirst?: No  Musculoskeletal Back pain?: No Joint pain?: No  Neurological Headaches?: No Dizziness?: No  Psychologic Depression?: No Anxiety?: No  Physical Exam: There were no vitals taken for this visit.  Constitutional:  Well nourished. Alert and oriented, No acute distress. Cardiovascular: No clubbing, cyanosis, or edema. Respiratory: Normal respiratory effort, no increased work of breathing. GU: No CVA tenderness.  No bladder fullness or masses.  Patient with uncircumcised phallus.  Foreskin easily retracted.  Urethral meatus is patent.  No penile discharge. No penile lesions or rashes. Scrotum without lesions, cysts, rashes and/or edema.  Testicles are located scrotally bilaterally.  Bilateral spermatoceles appreciated. No masses are appreciated in the testicles.  Left and right epididymis are normal. Rectal: Patient with normal sphincter tone.  Anus and perineum without scarring or rashes.  No rectal masses are appreciated. Prostate is approximately 60 grams, no hard nodules are appreciated, only the apex and midportion could be palpated.  Skin: No rashes, bruises or suspicious lesions. Neurologic: Grossly intact, no focal deficits, moving all 4 extremities. Psychiatric: Normal mood and affect.  Laboratory Data:   Lab Results  Component Value Date   WBC 7.5 07/23/2016   HGB 13.9 07/23/2016   HCT 43.0 07/23/2016   MCV 89 07/23/2016   PLT 239 07/23/2016   Lab Results  Component Value Date   CREATININE 1.61 (H) 07/23/2016   Lab Results  Component Value Date   TSH 6.330 (H) 07/26/2017      Component Value Date/Time   CHOL 164  07/26/2017 1058   HDL 41 07/26/2017 1058   CHOLHDL 4.0 07/26/2017 1058   LDLCALC 106 (H) 07/26/2017 1058   Lab Results  Component Value Date   AST 18 07/03/2015   Lab Results  Component Value  Date   ALT 11 07/03/2015   PSA Trend  See HPI I have reviewed the labs.  Assessment & Plan:    1. Elevated PSA - Current PSA 7.3 - Discussed with patient AUA guidelines fo men his age concerning when to biopsy (when PSA reaches 10 or higher)  -At this time we could discontinue PSA screening with the understanding that he may have prostate cancer but that his other health issues take precedence, continue to screen with PSA every 6 months or undergo a MRI of his prostate - he would like to continue screening and will return in 6 months for PSA, he may want to consider a prostate MRI at that visit   - Will recheck PSA when he returns in 6 months  2. BPH with LUTS - IPSS score is 21/2, it is slightly worse - discontinue doxazosin 4 mg daily - Starting tamsulosin 0.4 mg daily - Will no longer give patient finasteride due to allergic reaction - RTC in 6 months for I PSS, PSA and PVR  3. Erectile dysfunction:  - Sildenafil prescription refilled - RTC around 04/26/2019 for repeat SHIM score and exam  Return in about 6 months (around 01/24/2019) for IPSS, SHIM, PSA and exam.  Zara Council, PA-C  [place],  [id number] [phone]  I, Adele Schilder, am acting as a Education administrator for [employer], PA-C.   I have reviewed the above documentation for accuracy and completeness, and I agree with the above.    Zara Council, PA-C

## 2018-07-26 NOTE — Telephone Encounter (Signed)
Called patient back. Tamsulosin resent to pharmacy. Advised patient to stop taking Cardura and only take tamsulosin. Patient verbalized understanding.

## 2018-07-26 NOTE — Telephone Encounter (Signed)
Would you call Mr. Quiroa and ask him not to take the Cardura (doxazosin) while he is taking the tamsulosin?  They are basically the same medication.

## 2018-08-09 DIAGNOSIS — L299 Pruritus, unspecified: Secondary | ICD-10-CM | POA: Diagnosis not present

## 2018-08-11 ENCOUNTER — Telehealth: Payer: Self-pay | Admitting: Family Medicine

## 2018-08-11 MED ORDER — CYCLOBENZAPRINE HCL 5 MG PO TABS: 5.0000 mg | ORAL_TABLET | Freq: Three times a day (TID) | ORAL | 1 refills | Status: DC | PRN

## 2018-08-11 NOTE — Telephone Encounter (Signed)
pt's wife called saying Jacob Nielsen pulled a muscle in his back playing golf a couple days ago and she wants to know if you will call in a muscle relaxer or something for inflammation.  He does not want come in because his back is hurting pretty bad.  Pt has an appt tueday here for CPE   [phone]  [facility]  Thanks teri

## 2018-08-12 ENCOUNTER — Telehealth: Payer: Self-pay | Admitting: Family Medicine

## 2018-08-12 MED ORDER — NAPROXEN 375 MG PO TABS: 375.0000 mg | ORAL_TABLET | Freq: Two times a day (BID) | ORAL | 1 refills | Status: DC | PRN

## 2018-08-12 NOTE — Telephone Encounter (Signed)
See other telephone message 

## 2018-08-12 NOTE — Telephone Encounter (Signed)
Pt advised.  He states he has been taking Cyclobenzaprine, using ice and it is not helping with the pain.  Is there something else you could prescribe?  Thanks,   -Mickel Baas

## 2018-08-12 NOTE — Telephone Encounter (Signed)
Please review

## 2018-08-12 NOTE — Telephone Encounter (Signed)
Prescription for muscle relaxer was sent to pharmacy yesterday. He can also put ice pack for about 10 minutes every six hours.

## 2018-08-12 NOTE — Addendum Note (Signed)
Addended by: Birdie Sons on: 08/12/2018 02:23 PM   Modules accepted: Orders

## 2018-08-12 NOTE — Telephone Encounter (Signed)
pt's wife called saying saying Jacob Nielsen is still in a lot of pain in his back and she called Wednesday to see what Dr. Caryn Section can do for him.  He has an appt nest week.  He thinks he hurt his back playing golf  Their cb# [phone]    Thanks Con Memos

## 2018-08-12 NOTE — Telephone Encounter (Signed)
Pt advised.   Thanks,   -Isys Tietje  

## 2018-08-15 DIAGNOSIS — M9903 Segmental and somatic dysfunction of lumbar region: Secondary | ICD-10-CM | POA: Diagnosis not present

## 2018-08-15 DIAGNOSIS — M9904 Segmental and somatic dysfunction of sacral region: Secondary | ICD-10-CM | POA: Diagnosis not present

## 2018-08-15 DIAGNOSIS — M545 Low back pain: Secondary | ICD-10-CM | POA: Diagnosis not present

## 2018-08-15 DIAGNOSIS — M461 Sacroiliitis, not elsewhere classified: Secondary | ICD-10-CM | POA: Diagnosis not present

## 2018-08-16 ENCOUNTER — Ambulatory Visit (INDEPENDENT_AMBULATORY_CARE_PROVIDER_SITE_OTHER): Payer: PPO | Admitting: Family Medicine

## 2018-08-16 ENCOUNTER — Other Ambulatory Visit: Payer: Self-pay

## 2018-08-16 ENCOUNTER — Encounter: Payer: Self-pay | Admitting: Family Medicine

## 2018-08-16 VITALS — BP 140/70 | HR 60 | Temp 97.8°F | Resp 16 | Ht 72.0 in | Wt 194.0 lb

## 2018-08-16 DIAGNOSIS — Z0000 Encounter for general adult medical examination without abnormal findings: Secondary | ICD-10-CM | POA: Diagnosis not present

## 2018-08-16 DIAGNOSIS — E790 Hyperuricemia without signs of inflammatory arthritis and tophaceous disease: Secondary | ICD-10-CM | POA: Diagnosis not present

## 2018-08-16 DIAGNOSIS — N183 Chronic kidney disease, stage 3 unspecified: Secondary | ICD-10-CM

## 2018-08-16 DIAGNOSIS — E039 Hypothyroidism, unspecified: Secondary | ICD-10-CM | POA: Diagnosis not present

## 2018-08-16 DIAGNOSIS — M9903 Segmental and somatic dysfunction of lumbar region: Secondary | ICD-10-CM | POA: Diagnosis not present

## 2018-08-16 DIAGNOSIS — D510 Vitamin B12 deficiency anemia due to intrinsic factor deficiency: Secondary | ICD-10-CM

## 2018-08-16 DIAGNOSIS — M545 Low back pain: Secondary | ICD-10-CM | POA: Diagnosis not present

## 2018-08-16 DIAGNOSIS — M461 Sacroiliitis, not elsewhere classified: Secondary | ICD-10-CM | POA: Diagnosis not present

## 2018-08-16 DIAGNOSIS — I10 Essential (primary) hypertension: Secondary | ICD-10-CM | POA: Diagnosis not present

## 2018-08-16 DIAGNOSIS — M8580 Other specified disorders of bone density and structure, unspecified site: Secondary | ICD-10-CM | POA: Diagnosis not present

## 2018-08-16 DIAGNOSIS — M9904 Segmental and somatic dysfunction of sacral region: Secondary | ICD-10-CM | POA: Diagnosis not present

## 2018-08-16 NOTE — Progress Notes (Signed)
Patient: Jacob Dimaano., Male    DOB: 1934/05/18, 85 y.o.   MRN: [id number] Visit Date: 08/16/2018  Today's Provider: Lelon Huh, MD   Chief Complaint  Patient presents with  . Annual Exam   Subjective:     Complete Physical Jacob JOHNROSS NABOZNY Sr. is a 85 y.o. male. He feels fairly well. He reports exercising daily. He reports he is sleeping poorly.  -----------------------------------------------------------  Hypertension, follow-up:  BP Readings from Last 3 Encounters:  08/16/18 140/70  07/26/18 (!) 149/85  06/29/18 118/74    He was last seen for hypertension 1 years ago.  BP at that visit was 136/82. Management since that visit includes no changes. He reports good compliance with treatment. He is not having side effects.  He is exercising. He is not adherent to low salt diet.   Outside blood pressures are checked and average 665 systolic. Patient is unsure of the diastolic reading.Marland Kitchen He is experiencing none.  Patient denies chest pain, chest pressure/discomfort, claudication, dyspnea, exertional chest pressure/discomfort, fatigue, irregular heart beat, lower extremity edema, near-syncope, orthopnea, palpitations, paroxysmal nocturnal dyspnea, syncope and tachypnea.   Cardiovascular risk factors include advanced age (older than 20 for men, 52 for women), dyslipidemia, hypertension and male gender.  Use of agents associated with hypertension: thyroid hormones.     Weight trend: fluctuating a bit Wt Readings from Last 3 Encounters:  08/16/18 194 lb (88 kg)  07/26/18 190 lb (86.2 kg)  06/29/18 190 lb 6.4 oz (86.4 kg)    Current diet: well balanced  ------------------------------------------------------------------------  Follow up for Hypothyroidism:  The patient was last seen for this 1 years ago. Changes made at last visit include none.  He reports good compliance with treatment. He feels that condition is Unchanged. He is not having side effects.    ------------------------------------------------------------------------------------  Lipid/Cholesterol, Follow-up:   Last seen for this 1 years ago.  Management changes since that visit include none. . Last Lipid Panel:    Component Value Date/Time   CHOL 164 07/26/2017 1058   TRIG 87 07/26/2017 1058   HDL 41 07/26/2017 1058   CHOLHDL 4.0 07/26/2017 1058   LDLCALC 106 (H) 07/26/2017 1058    Risk factors for vascular disease include hypercholesterolemia and hypertension  He reports good compliance with treatment. He is not having side effects.  Current symptoms include none and have been stable. Weight trend: fluctuating a bit Prior visit with dietician: no Current diet: well balanced Current exercise: walking  Wt Readings from Last 3 Encounters:  08/16/18 194 lb (88 kg)  07/26/18 190 lb (86.2 kg)  06/29/18 190 lb 6.4 oz (86.4 kg)    -------------------------------------------------------------------  Follow up for Gout:  The patient was last seen for this 1 years ago. Changes made at last visit include none.  He reports good compliance with treatment. He feels that condition is Improved. He is not having side effects.   ------------------------------------------------------------------------------------  He also reports that he has taken Fosamax in the past apparently due to parathyroid hormone abnormalities and inquires whether he should be taking it.    He also has been calling in for pain In his back since doing yard work a few weeks ago, we did call in cyclobenzaprine which he states didn't help, however pain has improved that last few days and is now mostly resolved. He also tried naproxen but states it made his kidneys hurt.   Review of Systems  Constitutional: Negative for appetite change, chills, fatigue  and fever.  HENT: Negative for congestion, ear pain, hearing loss, nosebleeds and trouble swallowing.   Eyes: Negative for pain and visual disturbance.   Respiratory: Negative for cough, chest tightness and shortness of breath.   Cardiovascular: Negative for chest pain, palpitations and leg swelling.  Gastrointestinal: Negative for abdominal pain, blood in stool, constipation, diarrhea, nausea and vomiting.  Endocrine: Negative for polydipsia, polyphagia and polyuria.  Genitourinary: Positive for difficulty urinating. Negative for dysuria and flank pain.  Musculoskeletal: Positive for back pain. Negative for arthralgias, joint swelling, myalgias and neck stiffness.  Skin: Negative for color change, rash and wound.  Neurological: Negative for dizziness, tremors, seizures, speech difficulty, weakness, light-headedness and headaches.  Psychiatric/Behavioral: Negative for behavioral problems, confusion, decreased concentration, dysphoric mood and sleep disturbance. The patient is not nervous/anxious.   All other systems reviewed and are negative.   Social History   Socioeconomic History  . Marital status: Married    Spouse name: Jacob Nielsen  . Number of children: 2  . Years of education: Not on file  . Highest education level: Some college, no degree  Occupational History  . Occupation: Retired    Comment: Previously worked at a [employer]  . Financial resource strain: Not hard at all  . Food insecurity:    Worry: Never true    Inability: Never true  . Transportation needs:    Medical: No    Non-medical: No  Tobacco Use  . Smoking status: Former Smoker    Packs/day: 1.50    Years: 20.00    Pack years: 30.00    Types: Cigarettes    Last attempt to quit: 06/01/1978    Years since quitting: 40.2  . Smokeless tobacco: Never Used  Substance and Sexual Activity  . Alcohol use: No    Alcohol/week: 0.0 standard drinks    Comment: former Alcoholic, meets with AA once a week. Has been alcohol free for 34 years  . Drug use: No  . Sexual activity: Not on file  Lifestyle  . Physical activity:    Days per week: 0 days     Minutes per session: 0 min  . Stress: Not at all  Relationships  . Social connections:    Talks on phone: Patient refused    Gets together: Patient refused    Attends religious service: Patient refused    Active member of club or organization: Patient refused    Attends meetings of clubs or organizations: Patient refused    Relationship status: Patient refused  . Intimate partner violence:    Fear of current or ex partner: No    Emotionally abused: No    Physically abused: No    Forced sexual activity: No  Other Topics Concern  . Not on file  Social History Narrative  . Not on file    Past Medical History:  Diagnosis Date  . Arthritis    right hand  . BPH (benign prostatic hyperplasia)   . ED (erectile dysfunction)   . Elevated PSA   . GERD (gastroesophageal reflux disease)    occasional  . Glaucoma   . Gout   . Gout   . History of alcoholism ([place])   . History of chicken pox   . History of measles   . History of mumps   . History of pernicious anemia   . HTN (hypertension)    controlled  . Hypothyroidism   . Wears partial dentures    upper and lower  Patient Active Problem List   Diagnosis Date Noted  . Tinea corporis 07/22/2016  . Elevated PSA 07/13/2015  . Allergic rhinitis 03/08/2015  . BPH (benign prostatic hyperplasia) 03/08/2015  . Chronic kidney disease (CKD), stage III (moderate) ([place]) 03/08/2015  . Essential (primary) hypertension 03/08/2015  . Gouty arthritis 03/08/2015  . Gout 03/08/2015  . H/O alcohol abuse 03/08/2015  . Hyperlipidemia 03/08/2015  . Hypothyroid 03/08/2015  . Open-angle glaucoma 03/08/2015  . Osteopenia 03/08/2015  . Pernicious anemia 03/08/2015  . Pruritus 03/08/2015  . Psoriasiform dermatitis 04/18/2013  . Other long term (current) drug therapy 03/15/2013  . Polypharmacy 03/15/2013  . Dermatophytosis of groin 08/02/2012  . Dermatitis, eczematoid 05/17/2012    Past Surgical History:  Procedure Laterality Date  .  EYE SURGERY     laser eye surgery on right eye for glaucoma  . PARATHYROIDECTOMY  2004  . PHOTOCOAGULATION WITH LASER Right 10/07/2015   Procedure: PHOTOCOAGULATION WITH LASER RIGHT EYE TRANS SCLERAL;  Surgeon: Ronnell Freshwater, MD;  Location: [place];  Service: Ophthalmology;  Laterality: Right;  . TONSILLECTOMY      His family history includes Asthma in his mother; Heart attack in his father; Parkinson's disease in his father. There is no history of Kidney disease or Prostate cancer.   Current Outpatient Medications:  .  brimonidine (ALPHAGAN) 0.2 % ophthalmic solution, Place 1 drop into both eyes 2 (two) times daily., Disp: , Rfl:  .  colchicine 0.6 MG tablet, TAKE 1 TABLET BY MOUTH EVERY DAY AS NEEDED FOR GOUT, Disp: 90 tablet, Rfl: 3 .  cyanocobalamin (,VITAMIN B-12,) 1000 MCG/ML injection, INJECT 1 ML (1,000 MCG TOTAL) INTO THE MUSCLE EVERY 30 (THIRTY) DAYS., Disp: 1 mL, Rfl: 4 .  dorzolamide-timolol (COSOPT) 22.3-6.8 MG/ML ophthalmic solution, Place 1 drop into both eyes 2 (two) times daily. , Disp: , Rfl:  .  doxazosin (CARDURA) 8 MG tablet, TAKE 1 TABLET (8 MG TOTAL) BY MOUTH DAILY. (Patient taking differently: Take 4 mg by mouth. TAKE 1 TABLET (4 MG TOTAL) BY MOUTH DAILY.), Disp: 90 tablet, Rfl: 4 .  hydrocortisone 2.5 % ointment, APPLY TO ITCHING IN GROIN TWICE DAILY AS NEEDED, Disp: , Rfl:  .  irbesartan (AVAPRO) 150 MG tablet, TAKE 1 TABLET BY MOUTH EVERY DAY, Disp: 90 tablet, Rfl: 3 .  latanoprost (XALATAN) 0.005 % ophthalmic solution, Place 1 drop into both eyes at bedtime. , Disp: , Rfl:  .  levothyroxine (SYNTHROID, LEVOTHROID) 125 MCG tablet, Take 1 tablet (125 mcg total) by mouth daily at 6 (six) AM., Disp: 90 tablet, Rfl: 4 .  naproxen (NAPROSYN) 375 MG tablet, Take 1 tablet (375 mg total) by mouth 2 (two) times daily as needed for up to 30 days., Disp: 30 tablet, Rfl: 1 .  Needles & Syringes MISC, Use one each month for vitamin b12 injection, Disp: 12  each, Rfl: 0 .  probenecid (BENEMID) 500 MG tablet, TAKE 0.5 TABLETS (250 MG TOTAL) BY MOUTH TWICE DAILY (Patient taking differently: Take 250 mg by mouth daily. TAKE 0.5 TABLETS (250 MG TOTAL) BY MOUTH TWICE DAILY), Disp: 1 tablet, Rfl: 2 .  cyclobenzaprine (FLEXERIL) 5 MG tablet, Take 1-2 tablets (5-10 mg total) by mouth 3 (three) times daily as needed for muscle spasms. (Patient not taking: Reported on 08/16/2018), Disp: 30 tablet, Rfl: 1 .  sildenafil (REVATIO) 20 MG tablet, Take 3 to 5 tablets two hours before intercouse on an empty stomach.  Do not take with nitrates. (Patient not taking: Reported on  08/16/2018), Disp: 90 tablet, Rfl: 3  Patient Care Team: Birdie Sons, MD as PCP - General (Family Medicine) Regino Schultze, MD as Referring Physician (Dermatology) Leandrew Koyanagi, MD as Referring Physician (Ophthalmology) Emmaline Kluver., MD as Consulting Physician (Rheumatology)     Objective:    Vitals: BP 140/70 (BP Location: Left Arm, Patient Position: Sitting, Cuff Size: Large)   Pulse 60   Temp 97.8 F (36.6 C) (Oral)   Resp 16   Ht 6' (1.829 m)   Wt 194 lb (88 kg)   SpO2 98% Comment: room air  BMI 26.31 kg/m   Physical Exam   General Appearance:    Alert, cooperative, no distress, appears stated age  Head:    Normocephalic, without obvious abnormality, atraumatic  Eyes:    PERRL, conjunctiva/corneas clear, EOM's intact, fundi    benign, both eyes       Ears:    Normal TM's and external ear canals, both ears  Nose:   Nares normal, septum midline, mucosa normal, no drainage   or sinus tenderness  Throat:   Lips, mucosa, and tongue normal; teeth and gums normal  Neck:   Supple, symmetrical, trachea midline, no adenopathy;       thyroid:  No enlargement/tenderness/nodules; no carotid   bruit or JVD  Back:     Symmetric, no curvature, ROM normal, no CVA tenderness  Lungs:     Clear to auscultation bilaterally, respirations unlabored  Chest wall:    No  tenderness or deformity  Heart:    Regular rate and rhythm, S1 and S2 normal, no murmur, rub   or gallop  Abdomen:     Soft, non-tender, bowel sounds active all four quadrants,    no masses, no organomegaly  Genitalia:    deferred  Rectal:    deferred  Extremities:   Extremities normal, atraumatic, no cyanosis or edema  Pulses:   2+ and symmetric all extremities  Skin:   Skin color, texture, turgor normal, no rashes or lesions  Lymph nodes:   Cervical, supraclavicular, and axillary nodes normal  Neurologic:   CNII-XII intact. Normal strength, sensation and reflexes      throughout    Activities of Daily Living In your present state of health, do you have any difficulty performing the following activities: 06/29/2018  Hearing? N  Vision? Y  Comment Has loss of vision in right eye and cataract on left. Wears eye glasses.   Difficulty concentrating or making decisions? N  Walking or climbing stairs? N  Dressing or bathing? N  Doing errands, shopping? N  Preparing Food and eating ? N  Using the Toilet? N  In the past six months, have you accidently leaked urine? N  Do you have problems with loss of bowel control? N  Managing your Medications? N  Managing your Finances? N  Housekeeping or managing your Housekeeping? N  Some recent data might be hidden    Fall Risk Assessment Fall Risk  06/29/2018 06/24/2017 05/27/2016 04/08/2015  Falls in the past year? 0 Yes No No  Number falls in past yr: - 2 or more - -  Comment - tripped x 2 - -  Injury with Fall? - No - -  Follow up - Falls prevention discussed - -     Depression Screen PHQ 2/9 Scores 06/29/2018 06/29/2018 06/24/2017 06/24/2017  PHQ - 2 Score 0 0 0 0  PHQ- 9 Score 0 - 0 -    6CIT Screen 06/29/2018  What Year? 0 points  What month? 0 points  What time? 0 points  Count back from 20 0 points  Months in reverse 0 points  Repeat phrase 0 points  Total Score 0       Assessment & Plan:    Annual Physical Reviewed  patient's Family Medical History Reviewed and updated list of patient's medical providers Assessment of cognitive impairment was done Assessed patient's functional ability Established a written schedule for health screening [place] Completed and Reviewed  Exercise Activities and Dietary recommendations Goals    . DIET - INCREASE WATER INTAKE     Recommend increasing water intake to 6-8 glasses a day.     . Increase water intake     Starting 05/27/16, I will increase my water intake to 5 glasses a day.    . Reduce caffeine intake     Recommend to cut back on caffinated beverages and increase water consumption.        Immunization History  Administered Date(s) Administered  . Influenza, High Dose Seasonal PF 03/08/2015, 03/07/2016, 03/18/2017, 04/09/2018  . Influenza,inj,Quad PF,6+ Mos 03/28/2013, 02/14/2014  . Influenza-Unspecified 01/30/2014  . Pneumococcal Conjugate-13 02/14/2014  . Pneumococcal Polysaccharide-23 06/01/2008  . Tdap 10/18/2012  . Zoster 09/11/2014    Health Maintenance  Topic Date Due  . Samul Dada  10/19/2022  . INFLUENZA VACCINE  Completed  . PNA vac Low Risk Adult  Completed     Discussed health benefits of physical activity, and encouraged him to engage in regular exercise appropriate for his age and condition.    ------------------------------------------------------------------------------------------------------------  1. Annual physical exam   2. Essential (primary) hypertension Well controlled.  Continue current medications.   - Renal function panel - EKG 12-Lead  3. Hypothyroidism, unspecified type  - TSH  4. Chronic kidney disease (CKD), stage III (moderate) (HCC)  - Renal function panel - VITAMIN D 25 Hydroxy (Vit-D Deficiency, Fractures)  5. Pernicious anemia  - CBC  6. Osteopenia, unspecified location  - TSH - DG Bone Density; Future - VITAMIN D 25 Hydroxy (Vit-D Deficiency, Fractures)  7.  Hyperuricemia Gout followed by Dr. Jefm Bryant.  - Uric acid   Lelon Huh, MD  [facility]

## 2018-08-16 NOTE — Patient Instructions (Signed)
.   Please review the attached list of medications and notify my office if there are any errors.   . Please bring all of your medications to every appointment so we can make sure that our medication list is the same as yours.    The CDC recommends two doses of Shingrix (the shingles vaccine) separated by 2 to 6 months for adults age 83 years and older. I recommend checking with your insurance plan regarding coverage for this vaccine.   

## 2018-08-17 LAB — RENAL FUNCTION PANEL
Albumin: 4.3 g/dL (ref 3.6–4.6)
BUN/Creatinine Ratio: 14 (ref 10–24)
BUN: 24 mg/dL (ref 8–27)
CO2: 22 mmol/L (ref 20–29)
Calcium: 9.5 mg/dL (ref 8.6–10.2)
Chloride: 104 mmol/L (ref 96–106)
Creatinine, Ser: 1.67 mg/dL — ABNORMAL HIGH (ref 0.76–1.27)
GFR calc Af Amer: 43 mL/min/{1.73_m2} — ABNORMAL LOW (ref >59)
GFR, EST NON AFRICAN AMERICAN: 38 mL/min/{1.73_m2} — AB (ref >59)
Glucose: 101 mg/dL — ABNORMAL HIGH (ref 65–99)
Phosphorus: 3.9 mg/dL (ref 2.8–4.1)
Potassium: 4.4 mmol/L (ref 3.5–5.2)
Sodium: 143 mmol/L (ref 134–144)

## 2018-08-17 LAB — CBC
HEMATOCRIT: 39.4 % (ref 37.5–51.0)
Hemoglobin: 13.4 g/dL (ref 13.0–17.7)
MCH: 29.7 pg (ref 26.6–33.0)
MCHC: 34 g/dL (ref 31.5–35.7)
MCV: 87 fL (ref 79–97)
Platelets: 211 10*3/uL (ref 150–450)
RBC: 4.51 x10E6/uL (ref 4.14–5.80)
RDW: 13.4 % (ref 11.6–15.4)
WBC: 6.6 10*3/uL (ref 3.4–10.8)

## 2018-08-17 LAB — URIC ACID: Uric Acid: 7.4 mg/dL (ref 3.7–8.6)

## 2018-08-17 LAB — TSH: TSH: 12.05 u[IU]/mL — ABNORMAL HIGH (ref 0.450–4.500)

## 2018-08-17 LAB — VITAMIN D 25 HYDROXY (VIT D DEFICIENCY, FRACTURES): Vit D, 25-Hydroxy: 79 ng/mL (ref 30.0–100.0)

## 2018-08-18 ENCOUNTER — Telehealth: Payer: Self-pay

## 2018-08-18 DIAGNOSIS — M9904 Segmental and somatic dysfunction of sacral region: Secondary | ICD-10-CM | POA: Diagnosis not present

## 2018-08-18 DIAGNOSIS — M9903 Segmental and somatic dysfunction of lumbar region: Secondary | ICD-10-CM | POA: Diagnosis not present

## 2018-08-18 DIAGNOSIS — M545 Low back pain: Secondary | ICD-10-CM | POA: Diagnosis not present

## 2018-08-18 DIAGNOSIS — M461 Sacroiliitis, not elsewhere classified: Secondary | ICD-10-CM | POA: Diagnosis not present

## 2018-08-18 NOTE — Telephone Encounter (Signed)
-----   Message from Birdie Sons, MD sent at 08/17/2018  3:22 PM EDT ----- Is hypothyroid, need to take an extra 1/2 tablet  (1 1/2 tablets total) levothyroxine on Saturday, Sunday, Tuesday and Thursday. Continue 1 tablet on WMF. Rest of labs are good. Follow up o.v. in 8 weeks.

## 2018-08-18 NOTE — Telephone Encounter (Signed)
Pt advised.  Apt made for 10/17/2018 at 4:20pm.  Thanks,   -Mickel Baas

## 2018-09-12 ENCOUNTER — Telehealth: Payer: Self-pay | Admitting: *Deleted

## 2018-09-12 NOTE — Telephone Encounter (Signed)
Patient's wife called office stating pt has had  pain and burning in right foot for 1 week. Patient has been taking gout medication with no relief. Patient wanted to know if he should come in for ov or if he can have rx sent to pharmacy? Please advise?

## 2018-09-13 NOTE — Telephone Encounter (Signed)
Please advise 

## 2018-09-13 NOTE — Telephone Encounter (Signed)
Couldn't tell him what to do without seeing it. Can make an appointment to have it evaluated.

## 2018-09-13 NOTE — Telephone Encounter (Signed)
Patient scheduled for 11:00 am tomorrow.

## 2018-09-14 ENCOUNTER — Other Ambulatory Visit: Payer: Self-pay

## 2018-09-14 ENCOUNTER — Encounter: Payer: Self-pay | Admitting: Family Medicine

## 2018-09-14 ENCOUNTER — Ambulatory Visit (INDEPENDENT_AMBULATORY_CARE_PROVIDER_SITE_OTHER): Payer: PPO | Admitting: Family Medicine

## 2018-09-14 VITALS — BP 171/80 | HR 54 | Temp 97.5°F | Wt 186.0 lb

## 2018-09-14 DIAGNOSIS — M109 Gout, unspecified: Secondary | ICD-10-CM

## 2018-09-14 DIAGNOSIS — I10 Essential (primary) hypertension: Secondary | ICD-10-CM

## 2018-09-14 DIAGNOSIS — N401 Enlarged prostate with lower urinary tract symptoms: Secondary | ICD-10-CM | POA: Diagnosis not present

## 2018-09-14 DIAGNOSIS — R3911 Hesitancy of micturition: Secondary | ICD-10-CM | POA: Diagnosis not present

## 2018-09-14 MED ORDER — PREDNISONE 10 MG PO TABS: ORAL_TABLET | ORAL | 1 refills | Status: DC

## 2018-09-14 MED ORDER — TAMSULOSIN HCL 0.4 MG PO CAPS: 0.8000 mg | ORAL_CAPSULE | Freq: Every day | ORAL | 1 refills | Status: DC

## 2018-09-14 MED ORDER — COLCHICINE 0.6 MG PO TABS: ORAL_TABLET | ORAL | 2 refills | Status: DC

## 2018-09-14 NOTE — Progress Notes (Signed)
Patient: Jacob GRATZ Sr. Male    DOB: June 29, 1933   85 y.o.   MRN: [id number] Visit Date: 09/14/2018  Today's Provider: Lelon Huh, MD   Chief Complaint  Patient presents with  . Gout   Subjective:     HPI    Gout: Patient here for evaluation of acute gouty arthritis. The patient reports onset of an acute gout attack involving the right ankle, and knee beginning 3 days, and being treated with colchicine. He also had old prescription for 10mg  prednisone tablets and has been taking 1/2 tablet twice a day for last two days. Knee is nearly back to normal and he states ankle is about 80% better. Is on low dose of probenecid. Does not tolerate allopurinol or Uloric.  He also has history of BPH and htn, was changed from doxazosin to tamsulosin by urology, but he reports blood pressure have been increasing since that time.   Allergies  Allergen Reactions  . Krystexxa  [Pegloticase] Anaphylaxis  . Allopurinol     Abdominal pain and itching  . Ciprofloxacin     causes mouth to itch  . Levofloxacin Other (See Comments)    Tendon pain  . Mycophenolate Mofetil   . Niacin     Other reaction(s): UNKNOWN  . Sulfa Antibiotics Itching  . Uloric  [Febuxostat]     Other reaction(s): Abdominal pain  . Finasteride Itching     Current Outpatient Medications:  .  brimonidine (ALPHAGAN) 0.2 % ophthalmic solution, Place 1 drop into both eyes 2 (two) times daily., Disp: , Rfl:  .  colchicine 0.6 MG tablet, TAKE 1 TABLET BY MOUTH EVERY DAY AS NEEDED FOR GOUT, Disp: 90 tablet, Rfl: 3 .  cyanocobalamin (,VITAMIN B-12,) 1000 MCG/ML injection, INJECT 1 ML (1,000 MCG TOTAL) INTO THE MUSCLE EVERY 30 (THIRTY) DAYS., Disp: 1 mL, Rfl: 4 .  dorzolamide-timolol (COSOPT) 22.3-6.8 MG/ML ophthalmic solution, Place 1 drop into both eyes 2 (two) times daily. , Disp: , Rfl:  .  hydrocortisone 2.5 % ointment, APPLY TO ITCHING IN GROIN TWICE DAILY AS NEEDED, Disp: , Rfl:  .  irbesartan (AVAPRO) 150 MG  tablet, TAKE 1 TABLET BY MOUTH EVERY DAY, Disp: 90 tablet, Rfl: 3 .  latanoprost (XALATAN) 0.005 % ophthalmic solution, Place 1 drop into both eyes at bedtime. , Disp: , Rfl:  .  levothyroxine (SYNTHROID, LEVOTHROID) 125 MCG tablet, Take 1 tablet (125 mcg total) by mouth daily at 6 (six) AM., Disp: 90 tablet, Rfl: 4 .  probenecid (BENEMID) 500 MG tablet, TAKE 0.5 TABLETS (250 MG TOTAL) BY MOUTH TWICE DAILY (Patient taking differently: Take 250 mg by mouth daily. TAKE 0.5 TABLETS (250 MG TOTAL) BY MOUTH TWICE DAILY), Disp: 1 tablet, Rfl: 2 .  tamsulosin (FLOMAX) 0.4 MG CAPS capsule, Take 0.4 mg by mouth daily., Disp: , Rfl:  .  Needles & Syringes MISC, Use one each month for vitamin b12 injection, Disp: 12 each, Rfl: 0 .  sildenafil (REVATIO) 20 MG tablet, Take 3 to 5 tablets two hours before intercouse on an empty stomach.  Do not take with nitrates. (Patient not taking: Reported on 09/14/2018), Disp: 90 tablet, Rfl: 3  Review of Systems  Constitutional: Negative.   Respiratory: Negative.   Cardiovascular: Negative.   Musculoskeletal: Positive for arthralgias and joint swelling. Negative for back pain, gait problem, myalgias, neck pain and neck stiffness.    Social History   Tobacco Use  . Smoking status: Former Smoker  Packs/day: 1.50    Years: 20.00    Pack years: 30.00    Types: Cigarettes    Last attempt to quit: 06/01/1978    Years since quitting: 40.3  . Smokeless tobacco: Never Used  Substance Use Topics  . Alcohol use: No    Alcohol/week: 0.0 standard drinks    Comment: former Alcoholic, meets with AA once a week. Has been alcohol free for 34 years      Objective:   BP (!) 171/80 (BP Location: Right Arm, Patient Position: Sitting, Cuff Size: Normal)   Pulse (!) 54   Temp (!) 97.5 F (36.4 C) (Oral)   Wt 186 lb (84.4 kg)   BMI 25.23 kg/m  Vitals:   09/14/18 1108  BP: (!) 171/80  Pulse: (!) 54  Temp: (!) 97.5 F (36.4 C)  TempSrc: Oral  Weight: 186 lb (84.4 kg)      Physical Exam  General appearance: alert, well developed, well nourished, cooperative and in no distress Head: Normocephalic, without obvious abnormality, atraumatic Respiratory: Respirations even and unlabored, normal respiratory rate Extremities: Slight swelling right ankle, no knee swelling or redness.      Assessment & Plan    1. Essential (primary) hypertension Worse since discontinuing doxazosin, will double dose of tamsulosin as below which will hopefully help BP  2. Gouty arthritis Responded well to low dose prednisone. Will refill to take prn and refill colchicine.  - predniSONE (DELTASONE) 10 MG tablet; Take 1/2 to 1 tablet twice daily as needed for gout flares  Dispense: 20 tablet; Refill: 1 - colchicine 0.6 MG tablet; TAKE 1 TABLET BY MOUTH EVERY DAY AS NEEDED FOR GOUT  Dispense: 90 tablet; Refill: 2  3. Benign prostatic hyperplasia with urinary hesitancy double- tamsulosin (FLOMAX) 0.4 MG CAPS capsule; Take 2 capsules (0.8 mg total) by mouth daily.  Dispense: 3 capsule; Refill: 1 since one capsule daily has not been effective.      Lelon Huh, MD  [facility]

## 2018-09-14 NOTE — Patient Instructions (Signed)
.   Please review the attached list of medications and notify my office if there are any errors.   . Please bring all of your medications to every appointment so we can make sure that our medication list is the same as yours.    You can start taking 2 Flomax capsules every evening which should help blood pressure and prostate

## 2018-09-20 ENCOUNTER — Ambulatory Visit: Payer: Self-pay | Admitting: Family Medicine

## 2018-10-05 ENCOUNTER — Other Ambulatory Visit: Payer: PPO

## 2018-10-17 ENCOUNTER — Other Ambulatory Visit: Payer: Self-pay

## 2018-10-17 ENCOUNTER — Ambulatory Visit (INDEPENDENT_AMBULATORY_CARE_PROVIDER_SITE_OTHER): Payer: PPO | Admitting: Family Medicine

## 2018-10-17 ENCOUNTER — Encounter: Payer: Self-pay | Admitting: Family Medicine

## 2018-10-17 ENCOUNTER — Ambulatory Visit: Payer: Self-pay | Admitting: Family Medicine

## 2018-10-17 ENCOUNTER — Other Ambulatory Visit: Payer: Self-pay | Admitting: Family Medicine

## 2018-10-17 VITALS — BP 148/68 | HR 58 | Temp 97.9°F | Resp 16 | Wt 185.0 lb

## 2018-10-17 DIAGNOSIS — C44319 Basal cell carcinoma of skin of other parts of face: Secondary | ICD-10-CM | POA: Diagnosis not present

## 2018-10-17 DIAGNOSIS — E039 Hypothyroidism, unspecified: Secondary | ICD-10-CM | POA: Diagnosis not present

## 2018-10-17 DIAGNOSIS — I10 Essential (primary) hypertension: Secondary | ICD-10-CM

## 2018-10-17 DIAGNOSIS — M109 Gout, unspecified: Secondary | ICD-10-CM

## 2018-10-17 DIAGNOSIS — E790 Hyperuricemia without signs of inflammatory arthritis and tophaceous disease: Secondary | ICD-10-CM

## 2018-10-17 MED ORDER — FLUOROURACIL 5 % EX CREA: TOPICAL_CREAM | Freq: Two times a day (BID) | CUTANEOUS | 0 refills | Status: DC

## 2018-10-17 MED ORDER — ALLOPURINOL 100 MG PO TABS: 100.0000 mg | ORAL_TABLET | Freq: Every day | ORAL | 6 refills | Status: DC

## 2018-10-17 NOTE — Telephone Encounter (Signed)
Please review

## 2018-10-17 NOTE — Patient Instructions (Addendum)
.   Please review the attached list of medications and notify my office if there are any errors.   . Please bring all of your medications to every appointment so we can make sure that our medication list is the same as yours.    If you have any trouble itching, you can take OTC cetirizine (Zyrtec) once a day

## 2018-10-17 NOTE — Progress Notes (Signed)
Patient: Jacob ARCHULETTA Sr. Male    DOB: 12-Jan-1934   85 y.o.   MRN: [id number] Visit Date: 10/17/2018  Today's Provider: Lelon Huh, MD   Chief Complaint  Patient presents with  . Hypertension  . Hypothyroidism   Subjective:     HPI  Hypertension, follow-up:  BP Readings from Last 3 Encounters:  10/17/18 (!) 148/68  09/14/18 (!) 171/80  08/16/18 140/70    He was last seen for hypertension 1 months ago.  BP at that visit was 171/80. Management since that visit includes doubling the dose of tamsulosin. He reports poor compliance with treatment. He is having side effects. Patient reports that increasing the medication "hurt his kidneys". He is no longer taking tamsulosin.  Has started back on 4mg  doxazosin every day and reports home BP is usually in the 120s.70s He is not exercising. He is adherent to low salt diet.    Patient denies exertional chest pressure/discomfort, lower extremity edema and palpitations.  Weight trend: stable Wt Readings from Last 3 Encounters:  10/17/18 185 lb (83.9 kg)  09/14/18 186 lb (84.4 kg)  08/16/18 194 lb (88 kg)    Current diet: well balanced  Hypothyriodism, follow up: Patient was last seen in the office 1 month ago. Patient was advised to take an extra 1/2 tablet of levothyroxine every day but MWF. Patient has been tolerating medication changes well.  Lab Results  Component Value Date   TSH 12.050 (H) 08/16/2018   Skin changes: Patient also mentions that he has precancerous cells on his forehead and on his scalp that he is concerned about. He reports that he is not able to get in with his dermatologist due to [place].   He also states that he continues to have minor gout flares on probenecid. He had trouble with itching in the past when he was taking on allopurinol, but he was any several other medications and thinks that the itching was caused by something else. He would like to try the probenecid again.  Allergies   Allergen Reactions  . Krystexxa  [Pegloticase] Anaphylaxis  . Allopurinol     Abdominal pain and itching  . Ciprofloxacin     causes mouth to itch  . Levofloxacin Other (See Comments)    Tendon pain  . Mycophenolate Mofetil   . Niacin     Other reaction(s): UNKNOWN  . Sulfa Antibiotics Itching  . Uloric  [Febuxostat]     Other reaction(s): Abdominal pain  . Finasteride Itching     Current Outpatient Medications:  .  brimonidine (ALPHAGAN) 0.2 % ophthalmic solution, Place 1 drop into both eyes 2 (two) times daily., Disp: , Rfl:  .  colchicine 0.6 MG tablet, TAKE 1 TABLET BY MOUTH EVERY DAY AS NEEDED FOR GOUT, Disp: 90 tablet, Rfl: 2 .  cyanocobalamin (,VITAMIN B-12,) 1000 MCG/ML injection, INJECT 1 ML (1,000 MCG TOTAL) INTO THE MUSCLE EVERY 30 (THIRTY) DAYS., Disp: 1 mL, Rfl: 4 .  dorzolamide-timolol (COSOPT) 22.3-6.8 MG/ML ophthalmic solution, Place 1 drop into both eyes 2 (two) times daily. , Disp: , Rfl:  .  hydrocortisone 2.5 % ointment, APPLY TO ITCHING IN GROIN TWICE DAILY AS NEEDED, Disp: , Rfl:  .  irbesartan (AVAPRO) 150 MG tablet, TAKE 1 TABLET BY MOUTH EVERY DAY, Disp: 90 tablet, Rfl: 3 .  latanoprost (XALATAN) 0.005 % ophthalmic solution, Place 1 drop into both eyes at bedtime. , Disp: , Rfl:  .  levothyroxine (SYNTHROID, LEVOTHROID) 125 MCG  tablet, Taking 1 1/2 tablet on MWF and 1 tablet (125 mcg total) by mouth daily at 6 (six) AM., Disp: 90 tablet, Rfl: 4 .  Needles & Syringes MISC, Use one each month for vitamin b12 injection, Disp: 12 each, Rfl: 0 .  predniSONE (DELTASONE) 10 MG tablet, Take 1/2 to 1 tablet twice daily as needed for gout flares, Disp: 20 tablet, Rfl: 1 .  probenecid (BENEMID) 500 MG tablet, TAKE 0.5 TABLETS (250 MG TOTAL) BY MOUTH TWICE DAILY (Patient taking differently: Take 250 mg by mouth daily. TAKE 0.5 TABLETS (250 MG TOTAL) BY MOUTH TWICE DAILY), Disp: 1 tablet, Rfl: 2 .  sildenafil (REVATIO) 20 MG tablet, Take 3 to 5 tablets two hours before  intercouse on an empty stomach.  Do not take with nitrates. (Patient not taking: Reported on 09/14/2018), Disp: 90 tablet, Rfl: 3 .  tamsulosin (FLOMAX) 0.4 MG CAPS capsule, Take 2 capsules (0.8 mg total) by mouth daily. (Patient not taking: Reported on 10/17/2018), Disp: 3 capsule, Rfl: 1  Doxazosin 8mg  tablet 1/2 tablet daily  Review of Systems  Constitutional: Negative for activity change, appetite change, chills, diaphoresis, fatigue, fever and unexpected weight change.  Respiratory: Negative for cough.   Cardiovascular: Negative for chest pain, palpitations and leg swelling.  Endocrine: Negative for cold intolerance, heat intolerance, polydipsia, polyphagia and polyuria.  Genitourinary: Negative for decreased urine volume, difficulty urinating, discharge, flank pain, frequency, hematuria, penile pain, penile swelling, scrotal swelling, testicular pain and urgency.  Skin: Positive for color change.  Neurological: Negative for dizziness, light-headedness and headaches.  Psychiatric/Behavioral: Negative for self-injury, sleep disturbance and suicidal ideas. The patient is not nervous/anxious.     Social History   Tobacco Use  . Smoking status: Former Smoker    Packs/day: 1.50    Years: 20.00    Pack years: 30.00    Types: Cigarettes    Last attempt to quit: 06/01/1978    Years since quitting: 40.4  . Smokeless tobacco: Never Used  Substance Use Topics  . Alcohol use: No    Alcohol/week: 0.0 standard drinks    Comment: former Alcoholic, meets with AA once a week. Has been alcohol free for 34 years      Objective:   BP (!) 148/68 (BP Location: Left Arm, Patient Position: Sitting, Cuff Size: Normal)   Pulse (!) 58   Temp 97.9 F (36.6 C)   Resp 16   Wt 185 lb (83.9 kg)   SpO2 100%   BMI 25.09 kg/m  Vitals:   10/17/18 1106  BP: (!) 148/68  Pulse: (!) 58  Resp: 16  Temp: 97.9 F (36.6 C)  SpO2: 100%  Weight: 185 lb (83.9 kg)     Physical Exam   General Appearance:     Alert, cooperative, no distress  Eyes:    PERRL, conjunctiva/corneas clear, EOM's intact       Lungs:     Clear to auscultation bilaterally, respirations unlabored  Heart:    Regular rate and rhythm  Neurologic:   Awake, alert, oriented x 3. No apparent focal neurological           defect.           Assessment & Plan    1. Essential (primary) hypertension Intolerant to higher doses of tamsulosin, but now back on doxazosin and reports good home BP readings.  - TSH  2. Hypothyroidism, unspecified type Tolerating increased dose of levothyroxine.  - TSH  3. Basal cell carcinoma (BCC) of forehead (  suspected) He  Is unable to get in to follow up with dermatologist due to Covid-19. Lesions are pretty small at this point, will try Effudex for 3-6 weeks.   4. Hyperuricemia Not doing well on probenecid, will change back to allopurinol at his request, start at 100mg , anticipate titrating up if tolerating.   5. Gouty arthritis He does have colchicine at home to take for flare, and encouraged to take daily when starting back on allopurinol.     The entirety of the information documented in the History of Present Illness, Review of Systems and Physical Exam were personally obtained by me. Portions of this information were initially documented by Wilburt Finlay, CMA and reviewed by me for thoroughness and accuracy.   Lelon Huh, MD  [facility]

## 2018-10-18 ENCOUNTER — Telehealth: Payer: Self-pay

## 2018-10-18 ENCOUNTER — Other Ambulatory Visit: Payer: Self-pay

## 2018-10-18 LAB — TSH: TSH: 0.137 u[IU]/mL — ABNORMAL LOW (ref 0.450–4.500)

## 2018-10-18 MED ORDER — LEVOTHYROXINE SODIUM 150 MCG PO TABS: 150.0000 ug | ORAL_TABLET | Freq: Every day | ORAL | 3 refills | Status: DC

## 2018-10-18 NOTE — Telephone Encounter (Signed)
Patient has been advised, he request a refill of Levothyroxine 122mcg. KW

## 2018-10-18 NOTE — Telephone Encounter (Signed)
I sent in new prescription for 172mcg. He only needs to take one tablet every day. he won't need to take the extra half tablet on mondays Wednesday and Friday since this is a higher dose pill.

## 2018-10-18 NOTE — Telephone Encounter (Signed)
Patient notified of medication change.

## 2018-10-18 NOTE — Telephone Encounter (Signed)
-----   Message from Birdie Sons, MD sent at 10/18/2018  8:04 AM EDT ----- Thyroid functions are better, continue current dose of thyroid medication

## 2018-10-21 ENCOUNTER — Other Ambulatory Visit: Payer: Self-pay | Admitting: Urology

## 2018-11-24 DIAGNOSIS — L570 Actinic keratosis: Secondary | ICD-10-CM | POA: Diagnosis not present

## 2018-12-07 ENCOUNTER — Telehealth: Payer: Self-pay

## 2018-12-07 ENCOUNTER — Ambulatory Visit
Admission: RE | Admit: 2018-12-07 | Discharge: 2018-12-07 | Disposition: A | Discharge status: Home / Self Care | Payer: PPO | Source: Ambulatory Visit | Attending: Family Medicine | Admitting: Family Medicine

## 2018-12-07 ENCOUNTER — Other Ambulatory Visit: Payer: Self-pay

## 2018-12-07 DIAGNOSIS — Z13820 Encounter for screening for osteoporosis: Secondary | ICD-10-CM | POA: Insufficient documentation

## 2018-12-07 DIAGNOSIS — M8580 Other specified disorders of bone density and structure, unspecified site: Secondary | ICD-10-CM

## 2018-12-07 DIAGNOSIS — M8589 Other specified disorders of bone density and structure, multiple sites: Secondary | ICD-10-CM | POA: Diagnosis not present

## 2018-12-07 DIAGNOSIS — E039 Hypothyroidism, unspecified: Secondary | ICD-10-CM | POA: Insufficient documentation

## 2018-12-07 NOTE — Telephone Encounter (Signed)
Patient advised.

## 2018-12-07 NOTE — Telephone Encounter (Signed)
-----   Message from Birdie Sons, MD sent at 12/07/2018  2:50 PM EDT ----- Bone density is stable, repeat in 5 years.

## 2018-12-07 NOTE — Telephone Encounter (Signed)
Tried calling patient. Left message to call back. 

## 2018-12-13 ENCOUNTER — Other Ambulatory Visit: Payer: Self-pay | Admitting: *Deleted

## 2018-12-13 DIAGNOSIS — R972 Elevated prostate specific antigen [PSA]: Secondary | ICD-10-CM

## 2018-12-14 ENCOUNTER — Other Ambulatory Visit: Payer: PPO

## 2018-12-14 ENCOUNTER — Other Ambulatory Visit: Payer: Self-pay

## 2018-12-14 DIAGNOSIS — R972 Elevated prostate specific antigen [PSA]: Secondary | ICD-10-CM | POA: Diagnosis not present

## 2018-12-15 LAB — PSA: Prostate Specific Ag, Serum: 9.3 ng/mL — ABNORMAL HIGH (ref 0.0–4.0)

## 2018-12-20 NOTE — Progress Notes (Signed)
12/21/2018 3:57 PM   Jacob Darlyn Read Sr. March 03, 1934 [id number]  Referring provider: Birdie Sons, [address] [place],  [place] [address]  Chief Complaint  Patient presents with  . Elevated PSA    HPI: Jacob RUETER Sr. is a 85 y.o. male  with an elevated PSA, ED and BPH with LUTS who presents today for a three month follow up.  Elevated PSA PSA Trend  6.0 (12) in 2012 - bx negative - on finasteride  3.6 (7.2) in 09/2011  5.4 (10.8) in 04/2012  3.2 (6.4) in 09/2012  3.6 (7.2) in 04/2013  10.2 in 07/2015 - no finasteride  9.4 in 07/2015 - on finasteride  6.4 in 07/2015 - no finasteride  5.0 in 11/2015  7.7 in 11/2016             9.3 in 11/2017  7.4 in 04/2018 - no finasteride  7.3 in 07/2018 - no finasteride, patient had to stop 06/2018 due to itching; will no longer attempt finasteride  9.3 in 11/2018  He does not have a family history of prostate cancer.    BPH WITH LUTS  (prostate and/or bladder) IPSS score: 16/3     Previous score: 21/2   Previous PVR: 11 mL  Major complaint(s): Hesitancy.   Denies any dysuria, hematuria or suprapubic pain.   Currently taking: doxazosin 4 mg.  Patient is interested in trying tamsulosin, will switch medications.    Denies any recent fevers, chills, nausea or vomiting.   IPSS    Row Name 12/21/18 1100         International Prostate Symptom Score   How often have you had the sensation of not emptying your bladder?  About half the time     How often have you had to urinate less than every two hours?  Less than half the time     How often have you found you stopped and started again several times when you urinated?  About half the time     How often have you found it difficult to postpone urination?  About half the time     How often have you had a weak urinary stream?  Less than half the time     How often have you had to strain to start urination?  Less than half the time     How many times did you typically  get up at night to urinate?  1 Time     Total IPSS Score  16       Quality of Life due to urinary symptoms   If you were to spend the rest of your life with your urinary condition just the way it is now how would you feel about that?  Mixed       Score:  1-7 Mild 8-19 Moderate 20-35 Severe  Erectile dysfunction SHIM score is 14, mild to moderate ED.  He has been having difficulty with erections for one year.  His major complaint is lack of firmness.  His libido is preserved.   His risk factors for ED are age, BPH, HTN, HLD, and CKD.  He denies any painful erections or curvatures with his erections.   He is still having spontaneous erections.  He has tried sildenafil and found it effective.  PMH: Past Medical History:  Diagnosis Date  . Arthritis    right hand  . BPH (benign prostatic hyperplasia)   . ED (erectile dysfunction)   .  Elevated PSA   . GERD (gastroesophageal reflux disease)    occasional  . Glaucoma   . Gout   . Gout   . History of alcoholism ([place])   . History of chicken pox   . History of measles   . History of mumps   . History of pernicious anemia   . HTN (hypertension)    controlled  . Hypothyroidism   . Wears partial dentures    upper and lower     Surgical History: Past Surgical History:  Procedure Laterality Date  . EYE SURGERY     laser eye surgery on right eye for glaucoma  . PARATHYROIDECTOMY  2004  . PHOTOCOAGULATION WITH LASER Right 10/07/2015   Procedure: PHOTOCOAGULATION WITH LASER RIGHT EYE TRANS SCLERAL;  Surgeon: Ronnell Freshwater, MD;  Location: [facility];  Service: Ophthalmology;  Laterality: Right;  . TONSILLECTOMY      Home Medications:  Allergies as of 12/21/2018      Reactions   Krystexxa  [pegloticase] Anaphylaxis   Allopurinol    Abdominal pain and itching   Ciprofloxacin    causes mouth to itch   Levofloxacin Other (See Comments)   Tendon pain   Mycophenolate Mofetil    Niacin    Other reaction(s):  UNKNOWN   Sulfa Antibiotics Itching   Uloric  [febuxostat]    Other reaction(s): Abdominal pain   Finasteride Itching      Medication List       Accurate as of December 21, 2018 11:59 PM. If you have any questions, ask your nurse or doctor.        STOP taking these medications   hydrocortisone 2.5 % ointment Stopped by: Evie Croston, PA-C     TAKE these medications   allopurinol 100 MG tablet Commonly known as: ZYLOPRIM Take 1 tablet (100 mg total) by mouth daily.   brimonidine 0.2 % ophthalmic solution Commonly known as: ALPHAGAN Place 1 drop into both eyes 2 (two) times daily.   colchicine 0.6 MG tablet TAKE 1 TABLET BY MOUTH EVERY DAY AS NEEDED FOR GOUT   cyanocobalamin 1000 MCG/ML injection Commonly known as: (VITAMIN B-12) INJECT 1 ML (1,000 MCG TOTAL) INTO THE MUSCLE EVERY 30 (THIRTY) DAYS.   dorzolamide-timolol 22.3-6.8 MG/ML ophthalmic solution Commonly known as: COSOPT Place 1 drop into both eyes 2 (two) times daily.   doxazosin 8 MG tablet Commonly known as: CARDURA Take 4 mg by mouth daily.   fluorouracil 5 % cream Commonly known as: Efudex Apply topically 2 (two) times daily.   irbesartan 150 MG tablet Commonly known as: AVAPRO TAKE 1 TABLET BY MOUTH EVERY DAY   latanoprost 0.005 % ophthalmic solution Commonly known as: XALATAN Place 1 drop into both eyes at bedtime.   levothyroxine 150 MCG tablet Commonly known as: SYNTHROID Take 1 tablet (150 mcg total) by mouth daily at 6 (six) AM.   naproxen 375 MG tablet Commonly known as: NAPROSYN Take 1 tablet (375 mg total) by mouth 2 (two) times daily as needed.   Needles & Syringes Misc Use one each month for vitamin b12 injection   predniSONE 10 MG tablet Commonly known as: DELTASONE Take 1/2 to 1 tablet twice daily as needed for gout flares   probenecid 500 MG tablet Commonly known as: BENEMID TAKE 0.5 TABLETS (250 MG TOTAL) BY MOUTH TWICE DAILY   sildenafil 20 MG tablet Commonly known  as: REVATIO Take 3 to 5 tablets two hours before intercouse on an empty stomach.  Do not take with nitrates.   tamsulosin 0.4 MG Caps capsule Commonly known as: FLOMAX TAKE 1 CAPSULE BY MOUTH EVERY DAY       Allergies:  Allergies  Allergen Reactions  . Krystexxa  [Pegloticase] Anaphylaxis  . Allopurinol     Abdominal pain and itching  . Ciprofloxacin     causes mouth to itch  . Levofloxacin Other (See Comments)    Tendon pain  . Mycophenolate Mofetil   . Niacin     Other reaction(s): UNKNOWN  . Sulfa Antibiotics Itching  . Uloric  [Febuxostat]     Other reaction(s): Abdominal pain  . Finasteride Itching    Family History: Family History  Problem Relation Age of Onset  . Asthma Mother   . Heart attack Father   . Parkinson's disease Father   . Kidney disease Neg Hx   . Prostate cancer Neg Hx        maybe paternal grandfather ? unsure    Social History:  reports that he quit smoking about 40 years ago. His smoking use included cigarettes. He has a 30.00 pack-year smoking history. He has never used smokeless tobacco. He reports that he does not drink alcohol or use drugs.  ROS: UROLOGY Frequent Urination?: No Hard to postpone urination?: No Burning/pain with urination?: No Get up at night to urinate?: No Leakage of urine?: No Urine stream starts and stops?: No Trouble starting stream?: Yes Do you have to strain to urinate?: No Blood in urine?: No Urinary tract infection?: No Sexually transmitted disease?: No Injury to kidneys or bladder?: No Painful intercourse?: No Weak stream?: No Erection problems?: No Penile pain?: No  Gastrointestinal Nausea?: No Vomiting?: No Indigestion/heartburn?: No Diarrhea?: No Constipation?: No  Constitutional Fever: No Night sweats?: No Weight loss?: No Fatigue?: No  Skin Skin rash/lesions?: No Itching?: No  Eyes Blurred vision?: No Double vision?: No  Ears/Nose/Throat Sore throat?: No Sinus problems?: No   Hematologic/Lymphatic Swollen glands?: No Easy bruising?: No  Cardiovascular Leg swelling?: No Chest pain?: No  Respiratory Cough?: No Shortness of breath?: No  Endocrine Excessive thirst?: No  Musculoskeletal Back pain?: No Joint pain?: Yes  Neurological Headaches?: No Dizziness?: No  Psychologic Depression?: No Anxiety?: No  Physical Exam: BP (!) 154/87 (BP Location: Left Arm, Patient Position: Sitting, Cuff Size: Normal)   Pulse 72   Ht 6' (1.829 m)   Wt 186 lb (84.4 kg)   BMI 25.23 kg/m   Constitutional:  Well nourished. Alert and oriented, No acute distress. HEENT: [place] AT, moist mucus membranes.  Trachea midline, no masses. Cardiovascular: No clubbing, cyanosis, or edema. Respiratory: Normal respiratory effort, no increased work of breathing. GI: Abdomen is soft, non tender, non distended, no abdominal masses. Liver and spleen not palpable.  No hernias appreciated.  Stool sample for occult testing is not indicated.   GU: No CVA tenderness.  No bladder fullness or masses.  Patient with uncircumcised phallus.  Foreskin easily retracted  Urethral meatus is patent.  No penile discharge. No penile lesions or rashes. Scrotum without lesions, cysts, rashes and/or edema.  Testicles are located scrotally bilaterally. No masses are appreciated in the testicles. Left and right epididymis are normal. Rectal: Patient with  normal sphincter tone. Anus and perineum without scarring or rashes. No rectal masses are appreciated. Prostate is approximately 60 grams, could only palpate the apex and the midportion of the glans, no nodules are appreciated. Seminal vesicles could not be palpated.  Skin: No rashes, bruises or suspicious lesions.  Lymph: No cervical or inguinal adenopathy. Neurologic: Grossly intact, no focal deficits, moving all 4 extremities. Psychiatric: Normal mood and affect.  Laboratory Data:   Lab Results  Component Value Date   WBC 6.6 08/16/2018   HGB 13.4  08/16/2018   HCT 39.4 08/16/2018   MCV 87 08/16/2018   PLT 211 08/16/2018   Lab Results  Component Value Date   CREATININE 1.67 (H) 08/16/2018   Lab Results  Component Value Date   TSH 0.137 (L) 10/17/2018      Component Value Date/Time   CHOL 164 07/26/2017 1058   HDL 41 07/26/2017 1058   CHOLHDL 4.0 07/26/2017 1058   LDLCALC 106 (H) 07/26/2017 1058   Lab Results  Component Value Date   AST 18 07/03/2015   Lab Results  Component Value Date   ALT 11 07/03/2015   PSA Trend  See HPI I have reviewed the labs.  Assessment & Plan:    1. Elevated PSA - Current PSA 9.3 - Discussed with patient AUA guidelines fo men his age concerning when to biopsy (when PSA reaches 10 or higher)  -At this time we could discontinue PSA screening with the understanding that he may have prostate cancer but that his other health issues take precedence, continue to screen with PSA every 6 months or undergo a MRI of his prostate - he would like to undergo a prostate MRI at this time - I will call with the results   2. BPH with LUTS - IPSS score is 16/3, it is improved - continue tamsulosin 0.4 mg daily - Will no longer give patient finasteride due to allergic reaction - RTC in 6 months for I PSS, PSA and PVR - if prostate MRI is negative   3. Erectile dysfunction:  - SHIM score is 14,  - Sildenafil prescription refilled - RTC around 04/26/2019 for repeat SHIM score and exam  Return for I will call patient with results.  Zara Council, PA-C  [facility] [address] [place], [place] [phone]

## 2018-12-21 ENCOUNTER — Ambulatory Visit: Payer: PPO | Admitting: Urology

## 2018-12-21 ENCOUNTER — Other Ambulatory Visit: Payer: Self-pay

## 2018-12-21 ENCOUNTER — Encounter: Payer: Self-pay | Admitting: Urology

## 2018-12-21 VITALS — BP 154/87 | HR 72 | Ht 72.0 in | Wt 186.0 lb

## 2018-12-21 DIAGNOSIS — N401 Enlarged prostate with lower urinary tract symptoms: Secondary | ICD-10-CM

## 2018-12-21 DIAGNOSIS — R972 Elevated prostate specific antigen [PSA]: Secondary | ICD-10-CM | POA: Diagnosis not present

## 2018-12-21 DIAGNOSIS — N529 Male erectile dysfunction, unspecified: Secondary | ICD-10-CM

## 2018-12-21 DIAGNOSIS — N138 Other obstructive and reflux uropathy: Secondary | ICD-10-CM | POA: Diagnosis not present

## 2018-12-28 ENCOUNTER — Telehealth: Payer: Self-pay | Admitting: Urology

## 2018-12-28 NOTE — Telephone Encounter (Signed)
Jacob Nielsen states he will go forward with the eye xray.

## 2018-12-28 NOTE — Telephone Encounter (Signed)
See message I got from Manuela Schwartz in scheduling Please advise what you want to do?  Sharyn Lull  I scheduled an MRI for Jacob Nielsen ([id number]) on 01/12/19 @ 10:00. When I got to the question if his eyes had been injured by metal objects, he said yes. I mentioned that he would need to have an xray of his eyes. He was very insistent that he will not have an xray of his eyes. I spoke with the MRI department and they said they could not perform the MRI unless he has an xray first, for his own safety. And the xray is no extra charge either. Please help!

## 2018-12-29 NOTE — Telephone Encounter (Signed)
Thank you I have let scheduling know

## 2019-01-09 DIAGNOSIS — H401113 Primary open-angle glaucoma, right eye, severe stage: Secondary | ICD-10-CM | POA: Diagnosis not present

## 2019-01-11 ENCOUNTER — Other Ambulatory Visit: Payer: Self-pay | Admitting: Radiology

## 2019-01-11 DIAGNOSIS — Z1389 Encounter for screening for other disorder: Secondary | ICD-10-CM

## 2019-01-11 NOTE — Progress Notes (Signed)
Xray of the orbits needed prior to MRI for metal screening.

## 2019-01-12 ENCOUNTER — Other Ambulatory Visit: Payer: Self-pay

## 2019-01-12 ENCOUNTER — Ambulatory Visit
Admission: RE | Admit: 2019-01-12 | Discharge: 2019-01-12 | Disposition: A | Discharge status: Home / Self Care | Payer: PPO | Source: Ambulatory Visit | Attending: Urology | Admitting: Urology

## 2019-01-12 ENCOUNTER — Other Ambulatory Visit: Payer: Self-pay | Admitting: Urology

## 2019-01-12 DIAGNOSIS — Z135 Encounter for screening for eye and ear disorders: Secondary | ICD-10-CM | POA: Diagnosis not present

## 2019-01-12 DIAGNOSIS — R972 Elevated prostate specific antigen [PSA]: Secondary | ICD-10-CM | POA: Insufficient documentation

## 2019-01-12 DIAGNOSIS — Z1389 Encounter for screening for other disorder: Secondary | ICD-10-CM

## 2019-01-12 LAB — POCT I-STAT CREATININE: Creatinine, Ser: 1.5 mg/dL — ABNORMAL HIGH (ref 0.61–1.24)

## 2019-01-12 MED ORDER — GADOBUTROL 1 MMOL/ML IV SOLN
8.0000 mL | Freq: Once | INTRAVENOUS | Status: AC | PRN
  Administered 2019-01-12: 8 mL via INTRAVENOUS

## 2019-01-16 ENCOUNTER — Telehealth: Payer: Self-pay

## 2019-01-16 DIAGNOSIS — R972 Elevated prostate specific antigen [PSA]: Secondary | ICD-10-CM

## 2019-01-16 NOTE — Telephone Encounter (Signed)
(

## 2019-01-16 NOTE — Telephone Encounter (Signed)
-----   Message from Nori Riis, PA-C sent at 01/12/2019 11:37 AM EDT ----- I spoke to Jacob Nielsen and notified him of his prostate MRI results.  I would like to see him in 6 months with a PSA prior to his appointment.

## 2019-02-21 ENCOUNTER — Ambulatory Visit (INDEPENDENT_AMBULATORY_CARE_PROVIDER_SITE_OTHER): Payer: PPO | Admitting: Family Medicine

## 2019-02-21 ENCOUNTER — Encounter: Payer: Self-pay | Admitting: Family Medicine

## 2019-02-21 ENCOUNTER — Other Ambulatory Visit: Payer: Self-pay

## 2019-02-21 VITALS — BP 152/75 | HR 54 | Temp 97.1°F | Resp 15 | Wt 187.6 lb

## 2019-02-21 DIAGNOSIS — M109 Gout, unspecified: Secondary | ICD-10-CM | POA: Diagnosis not present

## 2019-02-21 DIAGNOSIS — I10 Essential (primary) hypertension: Secondary | ICD-10-CM

## 2019-02-21 DIAGNOSIS — L299 Pruritus, unspecified: Secondary | ICD-10-CM

## 2019-02-21 DIAGNOSIS — Z23 Encounter for immunization: Secondary | ICD-10-CM | POA: Diagnosis not present

## 2019-02-21 MED ORDER — HYDROXYZINE HCL 25 MG PO TABS: 25.0000 mg | ORAL_TABLET | Freq: Two times a day (BID) | ORAL | 0 refills | Status: DC | PRN

## 2019-02-21 NOTE — Progress Notes (Signed)
Patient: Jacob KOVEN Sr. Male    DOB: [birth year]   85 y.o.   MRN: [id number] Visit Date: 02/21/2019  Today's Provider: Lelon Huh, MD   Chief Complaint  Patient presents with  . Hypertension  . Hypothyroidism  . Gout  . Pruritis   Subjective:     Rash This is a new problem. The current episode started 1 to 4 weeks ago. The problem is unchanged. The affected locations include the genitalia and groin. The rash is characterized by itchiness. He was exposed to nothing. Associated symptoms include joint pain. Pertinent negatives include no anorexia, congestion, cough, diarrhea, eye pain, facial edema, fatigue, fever, nail changes, rhinorrhea, shortness of breath, sore throat or vomiting. Past treatments include nothing.    Hypertension, follow-up:  BP Readings from Last 3 Encounters:  02/21/19 (!) 152/75  12/21/18 (!) 154/87  10/17/18 (!) 148/68    He was last seen for hypertension 4 months ago.  BP at that visit was 148/68. Management changes since that visit include none. He reports excellent compliance with treatment. He is not having side effects.  He is exercising. He is adherent to low salt diet.   Outside blood pressures are systolic in the range of [id number]. He is experiencing none.  Patient denies chest pain, chest pressure/discomfort, claudication, dyspnea, exertional chest pressure/discomfort, fatigue, irregular heart beat, lower extremity edema, near-syncope, orthopnea, palpitations, paroxysmal nocturnal dyspnea, syncope and tachypnea.   Cardiovascular risk factors include advanced age (older than 51 for men, 30 for women), hypertension and male gender.  Use of agents associated with hypertension: NSAIDS.     Weight trend: stable Wt Readings from Last 3 Encounters:  02/21/19 187 lb 9.6 oz (85.1 kg)  12/21/18 186 lb (84.4 kg)  10/17/18 185 lb (83.9 kg)    Current diet: in general, a "healthy" diet     ------------------------------------------------------------------------  Hypothyroid, follow-up:  TSH  Date Value Ref Range Status  10/17/2018 0.137 (L) 0.450 - 4.500 uIU/mL Final  08/16/2018 12.050 (H) 0.450 - 4.500 uIU/mL Final  07/26/2017 6.330 (H) 0.450 - 4.500 uIU/mL Final   Wt Readings from Last 3 Encounters:  02/21/19 187 lb 9.6 oz (85.1 kg)  12/21/18 186 lb (84.4 kg)  10/17/18 185 lb (83.9 kg)    He was last seen for hypothyroid 4 months ago.  Management since that visit includes continuing the same medications. He reports excellent compliance with treatment. He is not having side effects.  He is exercising. He is experiencing none He denies change in energy level, diarrhea, heat / cold intolerance, nervousness, palpitations and weight changes Weight trend: stable  ------------------------------------------------------------------------  Follow up for Gouty Arthritis  The patient was last seen for this 4 months ago. Changes made at last visit include none patient encouraged to continue Allopurinol daily and Colchicine PRN.  He reports excellent compliance with treatment. He feels that condition is Unchanged. He is not having side effects.   Had two attacks this month in left ankle. He has had a lot of itching in scrotal area since starting allopurinol, but not rash or lesions.  ------------------------------------------------------------------------------------ Allergies  Allergen Reactions  . Krystexxa  [Pegloticase] Anaphylaxis  . Allopurinol     Abdominal pain and itching  . Ciprofloxacin     causes mouth to itch  . Levofloxacin Other (See Comments)    Tendon pain  . Mycophenolate Mofetil   . Niacin     Other reaction(s): UNKNOWN  . Sulfa Antibiotics Itching  .  Uloric  [Febuxostat]     Other reaction(s): Abdominal pain  . Finasteride Itching     Current Outpatient Medications:  .  allopurinol (ZYLOPRIM) 100 MG tablet, Take 1 tablet (100 mg total)  by mouth daily., Disp: 30 tablet, Rfl: 6 .  brimonidine (ALPHAGAN) 0.2 % ophthalmic solution, Place 1 drop into both eyes 2 (two) times daily., Disp: , Rfl:  .  colchicine 0.6 MG tablet, TAKE 1 TABLET BY MOUTH EVERY DAY AS NEEDED FOR GOUT, Disp: 90 tablet, Rfl: 2 .  cyanocobalamin (,VITAMIN B-12,) 1000 MCG/ML injection, INJECT 1 ML (1,000 MCG TOTAL) INTO THE MUSCLE EVERY 30 (THIRTY) DAYS., Disp: 1 mL, Rfl: 4 .  dorzolamide-timolol (COSOPT) 22.3-6.8 MG/ML ophthalmic solution, Place 1 drop into both eyes 2 (two) times daily. , Disp: , Rfl:  .  doxazosin (CARDURA) 8 MG tablet, Take 4 mg by mouth daily. , Disp: , Rfl:  .  fluorouracil (EFUDEX) 5 % cream, Apply topically 2 (two) times daily., Disp: 40 g, Rfl: 0 .  irbesartan (AVAPRO) 150 MG tablet, TAKE 1 TABLET BY MOUTH EVERY DAY, Disp: 90 tablet, Rfl: 3 .  latanoprost (XALATAN) 0.005 % ophthalmic solution, Place 1 drop into both eyes at bedtime. , Disp: , Rfl:  .  levothyroxine (SYNTHROID) 150 MCG tablet, Take 1 tablet (150 mcg total) by mouth daily at 6 (six) AM., Disp: 90 tablet, Rfl: 3 .  naproxen (NAPROSYN) 375 MG tablet, Take 1 tablet (375 mg total) by mouth 2 (two) times daily as needed., Disp: 30 tablet, Rfl: 2 .  Needles & Syringes MISC, Use one each month for vitamin b12 injection, Disp: 12 each, Rfl: 0 .  predniSONE (DELTASONE) 10 MG tablet, Take 1/2 to 1 tablet twice daily as needed for gout flares, Disp: 20 tablet, Rfl: 1 .  probenecid (BENEMID) 500 MG tablet, TAKE 0.5 TABLETS (250 MG TOTAL) BY MOUTH TWICE DAILY, Disp: 1 tablet, Rfl: 2 .  sildenafil (REVATIO) 20 MG tablet, Take 3 to 5 tablets two hours before intercouse on an empty stomach.  Do not take with nitrates., Disp: 90 tablet, Rfl: 3 .  tamsulosin (FLOMAX) 0.4 MG CAPS capsule, TAKE 1 CAPSULE BY MOUTH EVERY DAY, Disp: 90 capsule, Rfl: 1  Review of Systems  Constitutional: Negative for fatigue and fever.  HENT: Negative for congestion, rhinorrhea and sore throat.   Eyes: Negative  for pain.  Respiratory: Negative for cough and shortness of breath.   Gastrointestinal: Negative for anorexia, diarrhea and vomiting.  Musculoskeletal: Positive for joint pain.  Skin: Positive for rash. Negative for nail changes.    Social History   Tobacco Use  . Smoking status: Former Smoker    Packs/day: 1.50    Years: 20.00    Pack years: 30.00    Types: Cigarettes    Quit date: 06/01/1978    Years since quitting: 40.7  . Smokeless tobacco: Never Used  Substance Use Topics  . Alcohol use: No    Alcohol/week: 0.0 standard drinks    Comment: former Alcoholic, meets with AA once a week. Has been alcohol free for 34 years      Objective:   BP (!) 152/75   Pulse (!) 54   Temp (!) 97.1 F (36.2 C) (Oral)   Resp 15   Wt 187 lb 9.6 oz (85.1 kg)   BMI 25.44 kg/m  Vitals:   02/21/19 1443  BP: (!) 152/75  Pulse: (!) 54  Resp: 15  Temp: (!) 97.1 F (36.2 C)  TempSrc:  Oral  Weight: 187 lb 9.6 oz (85.1 kg)  Body mass index is 25.44 kg/m.   Physical Exam   General Appearance:    Alert, cooperative, no distress  Eyes:    PERRL, conjunctiva/corneas clear, EOM's intact       Lungs:     Clear to auscultation bilaterally, respirations unlabored  Heart:    Bradycardic. Normal rhythm. No murmurs, rubs, or gallops.   MS:   All extremities are intact.   Neurologic:   Awake, alert, oriented x 3. No apparent focal neurological           defect.           Assessment & Plan    1. Need for influenza vaccination  - Flu Vaccine QUAD High Dose(Fluad)  2. Gouty arthritis Much less frequent attacks since starting allopurinol, but is having more itching since starting medications. He states he tried and failed on probenicid and tart cherry extract and Is already avoid gout flaring foods. Will continue current dose of allopurinol for now.   3. Pruritus - hydrOXYzine (ATARAX/VISTARIL) 25 MG tablet; Take 1 tablet (25 mg total) by mouth 2 (two) times daily as needed for itching.   Dispense: 30 tablet; Refill: 0  4. Essential (primary) hypertension Stable, he reports home blood pressures in the 130s to 140s and has severe itching with many other antihypertensive medications. Will continue current regiment for the time being.   Follow up 6 months.     Lelon Huh, MD  [facility]

## 2019-02-21 NOTE — Patient Instructions (Signed)
.   Please review the attached list of medications and notify my office if there are any errors.   . Please bring all of your medications to every appointment so we can make sure that our medication list is the same as yours.   . It is especially important to get the annual flu vaccine this year. If you haven't had it already, please go to your pharmacy or call the office as soon as possible to schedule you flu shot.  

## 2019-02-28 ENCOUNTER — Other Ambulatory Visit: Payer: Self-pay | Admitting: Family Medicine

## 2019-03-19 ENCOUNTER — Other Ambulatory Visit: Payer: Self-pay | Admitting: Family Medicine

## 2019-05-10 ENCOUNTER — Other Ambulatory Visit: Payer: Self-pay | Admitting: Family Medicine

## 2019-05-11 ENCOUNTER — Other Ambulatory Visit: Payer: Self-pay | Admitting: Family Medicine

## 2019-05-11 MED ORDER — DOXAZOSIN MESYLATE 8 MG PO TABS: 4.0000 mg | ORAL_TABLET | Freq: Every day | ORAL | 4 refills | Status: DC

## 2019-05-11 NOTE — Telephone Encounter (Signed)
[facility] faxed refill request for the following medications:  doxazosin (CARDURA) 8 MG tablet   Please advise.  Thanks, [employer]

## 2019-05-11 NOTE — Telephone Encounter (Signed)
Please review refill request and directions.  Last documentation for this medication was entered as historical.

## 2019-05-12 ENCOUNTER — Other Ambulatory Visit: Payer: Self-pay | Admitting: Family Medicine

## 2019-05-12 NOTE — Telephone Encounter (Signed)
Requested medication (s) are due for refill today: yes  Requested medication (s) are on the active medication list: yes  Last refill:  02/13/2019  Future visit scheduled: yes  Notes to clinic:  review medication for refill   Requested Prescriptions  Pending Prescriptions Disp Refills   irbesartan (AVAPRO) 150 MG tablet [Pharmacy Med Name: IRBESARTAN 150 MG TABLET] 90 tablet 3    Sig: TAKE 1 TABLET BY MOUTH EVERY DAY      Cardiovascular:  Angiotensin Receptor Blockers Failed - 05/12/2019  1:40 PM      Failed - Cr in normal range and within 180 days    Creatinine, Ser  Date Value Ref Range Status  01/12/2019 1.50 (H) 0.61 - 1.24 mg/dL Final          Failed - K in normal range and within 180 days    Potassium  Date Value Ref Range Status  08/16/2018 4.4 3.5 - 5.2 mmol/L Final          Failed - Last BP in normal range    BP Readings from Last 1 Encounters:  02/21/19 (!) 152/75          Passed - Patient is not pregnant      Passed - Valid encounter within last 6 months    Recent Outpatient Visits           2 months ago Need for influenza vaccination   [facility] Birdie Sons, MD   6 months ago Essential (primary) hypertension   [facility], Kirstie Peri, MD   8 months ago Essential (primary) hypertension   [facility], Kirstie Peri, MD   8 months ago Annual physical exam   [facility] Birdie Sons, MD   1 year ago Annual physical exam   [facility] Birdie Sons, MD       Future Appointments             In 1 month  [place], [place]   In 2 months McGowan, Hunt Oris, PA-C [facility]   In 3 months Fisher, Kirstie Peri, MD [facility], [place]

## 2019-05-16 ENCOUNTER — Other Ambulatory Visit: Payer: Self-pay | Admitting: Family Medicine

## 2019-06-23 ENCOUNTER — Other Ambulatory Visit: Payer: Self-pay | Admitting: Family Medicine

## 2019-06-23 MED ORDER — HYDROXYZINE HCL 25 MG PO TABS: 25.0000 mg | ORAL_TABLET | Freq: Two times a day (BID) | ORAL | 1 refills | Status: DC | PRN

## 2019-07-03 NOTE — Progress Notes (Signed)
Subjective:   Jacob CHEROKEE SCRIVER Sr. is a 86 y.o. male who presents for Medicare Annual/Subsequent preventive examination.    This visit is being conducted through telemedicine due to the COVID-19 pandemic. This patient has given me verbal consent via doximity to conduct this visit, patient states they are participating from their home address. Some vital signs may be absent or patient reported.    Patient identification: identified by name, DOB, and current address  Review of Systems:  N/A  Cardiac Risk Factors include: advanced age (>65men, >7 women);male gender;dyslipidemia     Objective:    Vitals: There were no vitals taken for this visit.  There is no height or weight on file to calculate BMI. Unable to obtain vitals due to visit being conducted via telephonically.   Advanced Directives 07/04/2019 06/29/2018 06/24/2017 05/27/2016 10/07/2015  Does Patient Have a Medical Advance Directive? Yes Yes Yes Yes Yes  Type of Paramedic of [place];Living will [place];Living will Living will Living will;Healthcare Power of [place];Living will  Does patient want to make changes to medical advance directive? - - - - No - Patient declined  Copy of [place] in Chart? No - copy requested No - copy requested - Yes No - copy requested    Tobacco Social History   Tobacco Use  Smoking Status Former Smoker  . Packs/day: 1.50  . Years: 20.00  . Pack years: 30.00  . Types: Cigarettes  . Quit date: 06/01/1978  . Years since quitting: 41.1  Smokeless Tobacco Never Used     Counseling given: Not Answered   Clinical Intake:  Pre-visit preparation completed: Yes  Pain : No/denies pain Pain Score: 0-No pain     Nutritional Risks: None Diabetes: No  How often do you need to have someone help you when you read instructions, pamphlets, or other written materials from your doctor or pharmacy?: 1 -  Never  Interpreter Needed?: No  Information entered by :: [facility], LPN  Past Medical History:  Diagnosis Date  . Arthritis    right hand  . BPH (benign prostatic hyperplasia)   . ED (erectile dysfunction)   . Elevated PSA   . GERD (gastroesophageal reflux disease)    occasional  . Glaucoma   . Gout   . Gout   . History of alcoholism ([place])   . History of chicken pox   . History of measles   . History of mumps   . History of pernicious anemia   . HTN (hypertension)    controlled  . Hypothyroidism   . Wears partial dentures    upper and lower    Past Surgical History:  Procedure Laterality Date  . EYE SURGERY     laser eye surgery on right eye for glaucoma  . PARATHYROIDECTOMY  2004  . PHOTOCOAGULATION WITH LASER Right 10/07/2015   Procedure: PHOTOCOAGULATION WITH LASER RIGHT EYE TRANS SCLERAL;  Surgeon: Ronnell Freshwater, MD;  Location: [place];  Service: Ophthalmology;  Laterality: Right;  . TONSILLECTOMY     Family History  Problem Relation Age of Onset  . Asthma Mother   . Heart attack Father   . Parkinson's disease Father   . Kidney disease Neg Hx   . Prostate cancer Neg Hx        maybe paternal grandfather ? unsure   Social History   Socioeconomic History  . Marital status: Married    Spouse name:  Bo Mcclintock  . Number of children: 2  . Years of education: Not on file  . Highest education level: Some college, no degree  Occupational History  . Occupation: Retired    Comment: Previously worked at a [employer]  . Smoking status: Former Smoker    Packs/day: 1.50    Years: 20.00    Pack years: 30.00    Types: Cigarettes    Quit date: 06/01/1978    Years since quitting: 41.1  . Smokeless tobacco: Never Used  Substance and Sexual Activity  . Alcohol use: No    Alcohol/week: 0.0 standard drinks    Comment: former Alcoholic, meets with AA once a week. Has been alcohol free for 34 years  . Drug use: No  . Sexual  activity: Not on file  Other Topics Concern  . Not on file  Social History Narrative  . Not on file   Social Determinants of Health   Financial Resource Strain: Low Risk   . Difficulty of Paying Living Expenses: Not hard at all  Food Insecurity: No Food Insecurity  . Worried About Charity fundraiser in the Last Year: Never true  . Ran Out of Food in the Last Year: Never true  Transportation Needs: No Transportation Needs  . Lack of Transportation (Medical): No  . Lack of Transportation (Non-Medical): No  Physical Activity: Insufficiently Active  . Days of Exercise per Week: 7 days  . Minutes of Exercise per Session: 20 min  Stress: No Stress Concern Present  . Feeling of Stress : Not at all  Social Connections: Not Isolated  . Frequency of Communication with Friends and Family: More than three times a week  . Frequency of Social Gatherings with Friends and Family: Once a week  . Attends Religious Services: More than 4 times per year  . Active Member of Clubs or Organizations: Yes  . Attends Archivist Meetings: Never  . Marital Status: Married    Outpatient Encounter Medications as of 07/04/2019  Medication Sig  . allopurinol (ZYLOPRIM) 100 MG tablet TAKE 1 TABLET BY MOUTH EVERY DAY  . Ascorbic Acid (VITAMIN C) 100 MG tablet Take by mouth daily. Dose unsure  . brimonidine (ALPHAGAN) 0.2 % ophthalmic solution Place 1 drop into both eyes 2 (two) times daily.  . Cholecalciferol (VITAMIN D3) 10 MCG (400 UNIT) tablet Take by mouth daily. Unsure dose  . colchicine 0.6 MG tablet TAKE 1 TABLET BY MOUTH EVERY DAY AS NEEDED FOR GOUT  . cyanocobalamin (,VITAMIN B-12,) 1000 MCG/ML injection INJECT 1 ML (1,000 MCG TOTAL) INTO THE MUSCLE EVERY 30 (THIRTY) DAYS.  Marland Kitchen dorzolamide-timolol (COSOPT) 22.3-6.8 MG/ML ophthalmic solution Place 1 drop into both eyes 2 (two) times daily.   Marland Kitchen doxazosin (CARDURA) 8 MG tablet Take 0.5 tablets (4 mg total) by mouth daily.  . hydrOXYzine  (ATARAX/VISTARIL) 25 MG tablet Take 1 tablet (25 mg total) by mouth 2 (two) times daily as needed for itching.  . irbesartan (AVAPRO) 150 MG tablet TAKE 1 TABLET BY MOUTH EVERY DAY  . latanoprost (XALATAN) 0.005 % ophthalmic solution Place 1 drop into both eyes at bedtime.   Marland Kitchen levothyroxine (SYNTHROID) 150 MCG tablet Take 1 tablet (150 mcg total) by mouth daily at 6 (six) AM.  . Multiple Vitamin (MULTIVITAMIN) tablet Take 1 tablet by mouth daily.  . Needles & Syringes MISC Use one each month for vitamin b12 injection  . predniSONE (DELTASONE) 10 MG tablet Take 1/2 to 1 tablet  twice daily as needed for gout flares  . sildenafil (REVATIO) 20 MG tablet Take 3 to 5 tablets two hours before intercouse on an empty stomach.  Do not take with nitrates.  . tamsulosin (FLOMAX) 0.4 MG CAPS capsule TAKE 1 CAPSULE BY MOUTH EVERY DAY  . fluorouracil (EFUDEX) 5 % cream Apply topically 2 (two) times daily. (Patient not taking: Reported on 07/04/2019)  . naproxen (NAPROSYN) 375 MG tablet Take 1 tablet (375 mg total) by mouth 2 (two) times daily as needed. (Patient not taking: Reported on 07/04/2019)  . probenecid (BENEMID) 500 MG tablet TAKE 0.5 TABLETS (250 MG TOTAL) BY MOUTH TWICE DAILY (Patient not taking: Reported on 07/04/2019)   No facility-administered encounter medications on file as of 07/04/2019.    Activities of Daily Living In your present state of health, do you have any difficulty performing the following activities: 07/04/2019  Hearing? N  Vision? N  Difficulty concentrating or making decisions? N  Walking or climbing stairs? N  Dressing or bathing? N  Doing errands, shopping? N  Preparing Food and eating ? N  Using the Toilet? N  In the past six months, have you accidently leaked urine? Y  Comment Due to prostate issues.  Do you have problems with loss of bowel control? N  Managing your Medications? N  Managing your Finances? N  Housekeeping or managing your Housekeeping? N  Some recent data  might be hidden    Patient Care Team: Birdie Sons, MD as PCP - General (Family Medicine) Regino Schultze, MD as Referring Physician (Dermatology) Leandrew Koyanagi, MD as Referring Physician (Ophthalmology) Emmaline Kluver., MD as Consulting Physician (Rheumatology) Laneta Simmers as Physician Assistant (Urology)   Assessment:   This is a routine wellness examination for Jacob Nielsen.  Exercise Activities and Dietary recommendations Current Exercise Habits: Home exercise routine, Type of exercise: walking, Time (Minutes): 15, Frequency (Times/Week): 7, Weekly Exercise (Minutes/Week): 105, Intensity: Mild, Exercise limited by: None identified  Goals    . DIET - INCREASE WATER INTAKE     Recommend increasing water intake to 6-8 glasses a day.     . Reduce caffeine intake     Recommend to cut back on caffinated beverages and increase water consumption.        Fall Risk: Fall Risk  07/04/2019 06/29/2018 06/24/2017 05/27/2016 04/08/2015  Falls in the past year? 0 0 Yes No No  Number falls in past yr: 0 - 2 or more - -  Comment - - tripped x 2 - -  Injury with Fall? 0 - No - -  Follow up - - Falls prevention discussed - -    FALL RISK PREVENTION PERTAINING TO THE HOME:  Any stairs in or around the home? Yes  If so, are there any without handrails? No   Home free of loose throw rugs in walkways, pet beds, electrical cords, etc? Yes  Adequate lighting in your home to reduce risk of falls? Yes   ASSISTIVE DEVICES UTILIZED TO PREVENT FALLS:  Life alert? No  Use of a cane, walker or w/c? No  Grab bars in the bathroom? Yes  Shower chair or bench in shower? Yes  Elevated toilet seat or a handicapped toilet? Yes   TIMED UP AND GO:  Was the test performed? No .    Depression Screen PHQ 2/9 Scores 07/04/2019 07/04/2019 06/29/2018 06/29/2018  PHQ - 2 Score 0 0 0 0  PHQ- 9 Score - - 0 -  Cognitive Function: Declined today.      6CIT Screen 06/29/2018 05/27/2016   What Year? 0 points 0 points  What month? 0 points 0 points  What time? 0 points 0 points  Count back from 20 0 points 0 points  Months in reverse 0 points 2 points  Repeat phrase 0 points 2 points  Total Score 0 4    Immunization History  Administered Date(s) Administered  . Fluad Quad(high Dose 65+) 02/21/2019  . Influenza, High Dose Seasonal PF 03/08/2015, 03/07/2016, 03/18/2017, 04/09/2018  . Influenza,inj,Quad PF,6+ Mos 03/28/2013, 02/14/2014  . Influenza-Unspecified 01/30/2014  . Pneumococcal Conjugate-13 02/14/2014  . Pneumococcal Polysaccharide-23 06/01/2008  . Tdap 10/18/2012  . Zoster 09/11/2014    Qualifies for Shingles Vaccine? Yes  Zostavax completed 09/11/14. Due for Shingrix. Pt has been advised to call insurance company to determine out of pocket expense. Advised may also receive vaccine at local pharmacy or Health Dept. Verbalized acceptance and understanding.  Tdap: Up to date  Flu Vaccine: Up to date  Pneumococcal Vaccine: Completed series  Screening Tests Health Maintenance  Topic Date Due  . TETANUS/TDAP  10/19/2022  . INFLUENZA VACCINE  Completed  . PNA vac Low Risk Adult  Completed   Cancer Screenings:  Colorectal Screening: No longer required.   Lung Cancer Screening: (Low Dose CT Chest recommended if Age 63-80 years, 30 pack-year currently smoking OR have quit w/in 15years.) does not qualify.   Additional Screening:  Vision Screening: Recommended annual ophthalmology exams for early detection of glaucoma and other disorders of the eye.  Dental Screening: Recommended annual dental exams for proper oral hygiene  Community Resource Referral:  CRR required this visit?  No        Plan:  I have personally reviewed and addressed the Medicare Annual Wellness questionnaire and have noted the following in the patient's chart:  A. Medical and social history B. Use of alcohol, tobacco or illicit drugs  C. Current medications and  supplements D. Functional ability and status E.  Nutritional status F.  Physical activity G. Advance directives H. List of other physicians I.  Hospitalizations, surgeries, and ER visits in previous 12 months J.  [place] such as hearing and vision if needed, cognitive and depression L. Referrals and appointments   In addition, I have reviewed and discussed with patient certain preventive protocols, quality metrics, and best practice recommendations. A written personalized care plan for preventive services as well as general preventive health recommendations were provided to patient.   Glendora Score, [place]  [id number] Nurse Health Advisor   Nurse Notes: None.

## 2019-07-04 ENCOUNTER — Ambulatory Visit (INDEPENDENT_AMBULATORY_CARE_PROVIDER_SITE_OTHER): Payer: PPO

## 2019-07-04 ENCOUNTER — Other Ambulatory Visit: Payer: Self-pay

## 2019-07-04 DIAGNOSIS — Z0000 Encounter for general adult medical examination without abnormal findings: Secondary | ICD-10-CM

## 2019-07-04 NOTE — Patient Instructions (Addendum)
Jacob Nielsen , Thank you for taking time to come for your Medicare Wellness Visit. I appreciate your ongoing commitment to your health goals. Please review the following plan we discussed and let me know if I can assist you in the future.   Screening recommendations/referrals: Colonoscopy: No longer required.  Recommended yearly ophthalmology/optometry visit for glaucoma screening and checkup Recommended yearly dental visit for hygiene and checkup  Vaccinations: Influenza vaccine: Up to date Pneumococcal vaccine: Completed series Tdap vaccine: Up to date, due 09/2022 Shingles vaccine: Pt declines today.     Advanced directives: Please bring a copy of your POA (Power of Attorney) and/or Living Will to your next appointment.   Conditions/risks identified: Recommend to decrease amount of coffee consumed by half (no more than 3 glasses a day).  Next appointment: 08/22/19 @ 2:40 PM with Dr Caryn Section  Preventive Care 67 Years and Older, Male Preventive care refers to lifestyle choices and visits with your health care provider that can promote health and wellness. What does preventive care include?  A yearly physical exam. This is also called an annual well check.  Dental exams once or twice a year.  Routine eye exams. Ask your health care provider how often you should have your eyes checked.  Personal lifestyle choices, including:  Daily care of your teeth and gums.  Regular physical activity.  Eating a healthy diet.  Avoiding tobacco and drug use.  Limiting alcohol use.  Practicing safe sex.  Taking low doses of aspirin every day.  Taking vitamin and mineral supplements as recommended by your health care provider. What happens during an annual well check? The services and screenings done by your health care provider during your annual well check will depend on your age, overall health, lifestyle risk factors, and family history of disease. Counseling  Your health care provider  may ask you questions about your:  Alcohol use.  Tobacco use.  Drug use.  Emotional well-being.  Home and relationship well-being.  Sexual activity.  Eating habits.  History of falls.  Memory and ability to understand (cognition).  Work and work Statistician. Screening  You may have the following tests or measurements:  Height, weight, and BMI.  Blood pressure.  Lipid and cholesterol levels. These may be checked every 5 years, or more frequently if you are over 20 years old.  Skin check.  Lung cancer screening. You may have this screening every year starting at age 41 if you have a 30-pack-year history of smoking and currently smoke or have quit within the past 15 years.  Fecal occult blood test (FOBT) of the stool. You may have this test every year starting at age 31.  Flexible sigmoidoscopy or colonoscopy. You may have a sigmoidoscopy every 5 years or a colonoscopy every 10 years starting at age 68.  Prostate cancer screening. Recommendations will vary depending on your family history and other risks.  Hepatitis C blood test.  Hepatitis B blood test.  Sexually transmitted disease (STD) testing.  Diabetes screening. This is done by checking your blood sugar (glucose) after you have not eaten for a while (fasting). You may have this done every 1-3 years.  Abdominal aortic aneurysm (AAA) screening. You may need this if you are a current or former smoker.  Osteoporosis. You may be screened starting at age 34 if you are at high risk. Talk with your health care provider about your test results, treatment options, and if necessary, the need for more tests. Vaccines  Your health care  provider may recommend certain vaccines, such as:  Influenza vaccine. This is recommended every year.  Tetanus, diphtheria, and acellular pertussis (Tdap, Td) vaccine. You may need a Td booster every 10 years.  Zoster vaccine. You may need this after age 47.  Pneumococcal 13-valent  conjugate (PCV13) vaccine. One dose is recommended after age 31.  Pneumococcal polysaccharide (PPSV23) vaccine. One dose is recommended after age 50. Talk to your health care provider about which screenings and vaccines you need and how often you need them. This information is not intended to replace advice given to you by your health care provider. Make sure you discuss any questions you have with your health care provider. Document Released: 06/14/2015 Document Revised: 02/05/2016 Document Reviewed: 03/19/2015 Elsevier Interactive Patient Education  2017 [place] Prevention in the Home Falls can cause injuries. They can happen to people of all ages. There are many things you can do to make your home safe and to help prevent falls. What can I do on the outside of my home?  Regularly fix the edges of walkways and driveways and fix any cracks.  Remove anything that might make you trip as you walk through a door, such as a raised step or threshold.  Trim any bushes or trees on the path to your home.  Use bright outdoor lighting.  Clear any walking paths of anything that might make someone trip, such as rocks or tools.  Regularly check to see if handrails are loose or broken. Make sure that both sides of any steps have handrails.  Any raised decks and porches should have guardrails on the edges.  Have any leaves, snow, or ice cleared regularly.  Use sand or salt on walking paths during winter.  Clean up any spills in your garage right away. This includes oil or grease spills. What can I do in the bathroom?  Use night lights.  Install grab bars by the toilet and in the tub and shower. Do not use towel bars as grab bars.  Use non-skid mats or decals in the tub or shower.  If you need to sit down in the shower, use a plastic, non-slip stool.  Keep the floor dry. Clean up any water that spills on the floor as soon as it happens.  Remove soap buildup in the tub or  shower regularly.  Attach bath mats securely with double-sided non-slip rug tape.  Do not have throw rugs and other things on the floor that can make you trip. What can I do in the bedroom?  Use night lights.  Make sure that you have a light by your bed that is easy to reach.  Do not use any sheets or blankets that are too big for your bed. They should not hang down onto the floor.  Have a firm chair that has side arms. You can use this for support while you get dressed.  Do not have throw rugs and other things on the floor that can make you trip. What can I do in the kitchen?  Clean up any spills right away.  Avoid walking on wet floors.  Keep items that you use a lot in easy-to-reach places.  If you need to reach something above you, use a strong step stool that has a grab bar.  Keep electrical cords out of the way.  Do not use floor polish or wax that makes floors slippery. If you must use wax, use non-skid floor wax.  Do not have throw  rugs and other things on the floor that can make you trip. What can I do with my stairs?  Do not leave any items on the stairs.  Make sure that there are handrails on both sides of the stairs and use them. Fix handrails that are broken or loose. Make sure that handrails are as long as the stairways.  Check any carpeting to make sure that it is firmly attached to the stairs. Fix any carpet that is loose or worn.  Avoid having throw rugs at the top or bottom of the stairs. If you do have throw rugs, attach them to the floor with carpet tape.  Make sure that you have a light switch at the top of the stairs and the bottom of the stairs. If you do not have them, ask someone to add them for you. What else can I do to help prevent falls?  Wear shoes that:  Do not have high heels.  Have rubber bottoms.  Are comfortable and fit you well.  Are closed at the toe. Do not wear sandals.  If you use a stepladder:  Make sure that it is fully  opened. Do not climb a closed stepladder.  Make sure that both sides of the stepladder are locked into place.  Ask someone to hold it for you, if possible.  Clearly mark and make sure that you can see:  Any grab bars or handrails.  First and last steps.  Where the edge of each step is.  Use tools that help you move around (mobility aids) if they are needed. These include:  Canes.  Walkers.  Scooters.  Crutches.  Turn on the lights when you go into a dark area. Replace any light bulbs as soon as they burn out.  Set up your furniture so you have a clear path. Avoid moving your furniture around.  If any of your floors are uneven, fix them.  If there are any pets around you, be aware of where they are.  Review your medicines with your doctor. Some medicines can make you feel dizzy. This can increase your chance of falling. Ask your doctor what other things that you can do to help prevent falls. This information is not intended to replace advice given to you by your health care provider. Make sure you discuss any questions you have with your health care provider. Document Released: 03/14/2009 Document Revised: 10/24/2015 Document Reviewed: 06/22/2014 Elsevier Interactive Patient Education  2017 [employer].

## 2019-07-10 DIAGNOSIS — H401133 Primary open-angle glaucoma, bilateral, severe stage: Secondary | ICD-10-CM | POA: Diagnosis not present

## 2019-07-17 ENCOUNTER — Other Ambulatory Visit: Payer: PPO

## 2019-07-17 DIAGNOSIS — H401113 Primary open-angle glaucoma, right eye, severe stage: Secondary | ICD-10-CM | POA: Diagnosis not present

## 2019-07-18 ENCOUNTER — Other Ambulatory Visit: Payer: Self-pay

## 2019-07-18 ENCOUNTER — Other Ambulatory Visit: Payer: PPO

## 2019-07-18 DIAGNOSIS — R972 Elevated prostate specific antigen [PSA]: Secondary | ICD-10-CM | POA: Diagnosis not present

## 2019-07-19 ENCOUNTER — Other Ambulatory Visit: Payer: Self-pay | Admitting: Family Medicine

## 2019-07-19 LAB — PSA: Prostate Specific Ag, Serum: 7.9 ng/mL — ABNORMAL HIGH (ref 0.0–4.0)

## 2019-07-19 NOTE — Telephone Encounter (Signed)
Requested medication (s) are due for refill today: yes  Requested medication (s) are on the active medication list: yes  Last refill:  04/20/2019  Future visit scheduled: yes  Notes to clinic:  Medication not assigned to a protocol, review manually   Requested Prescriptions  Pending Prescriptions Disp Refills   cyanocobalamin (,VITAMIN B-12,) 1000 MCG/ML injection [Pharmacy Med Name: CYANOCOBALAMIN 1,000 MCG/ML] 3 mL 1    Sig: INJECT 1 ML (1,000 MCG TOTAL) INTO THE MUSCLE EVERY 30 (THIRTY) DAYS.      Off-Protocol Failed - 07/19/2019  1:02 AM      Failed - Medication not assigned to a protocol, review manually.      Passed - Valid encounter within last 12 months    Recent Outpatient Visits           4 months ago Need for influenza vaccination   [facility] Birdie Sons, MD   9 months ago Essential (primary) hypertension   [facility] Birdie Sons, MD   10 months ago Essential (primary) hypertension   [facility] Birdie Sons, MD   11 months ago Annual physical exam   [facility] Birdie Sons, MD   1 year ago Annual physical exam   [facility] Birdie Sons, MD       Future Appointments             In 2 days McGowan, Gordan Payment [place]   In 1 month Caryn Section, Kirstie Peri, MD [facility]           Off-Protocol Failed - 07/19/2019  1:02 AM      Failed - Medication not assigned to a protocol, review manually.      Passed - Valid encounter within last 12 months    Recent Outpatient Visits           4 months ago Need for influenza vaccination   [facility] Birdie Sons, MD   9 months ago Essential (primary) hypertension   [facility] Birdie Sons, MD   10 months ago Essential (primary) hypertension   [facility] Birdie Sons, MD   11 months ago Annual physical exam   [facility] Birdie Sons, MD   1 year ago Annual physical exam   [facility] Birdie Sons, MD       Future Appointments             In 2 days McGowan, Gordan Payment [place]   In 1 month Fisher, Kirstie Peri, MD [facility] [place]

## 2019-07-21 ENCOUNTER — Encounter: Payer: Self-pay | Admitting: Urology

## 2019-07-21 ENCOUNTER — Other Ambulatory Visit: Payer: Self-pay

## 2019-07-21 ENCOUNTER — Ambulatory Visit: Payer: PPO | Admitting: Urology

## 2019-07-21 VITALS — BP 173/77 | HR 64 | Ht 71.0 in | Wt 185.0 lb

## 2019-07-21 DIAGNOSIS — N138 Other obstructive and reflux uropathy: Secondary | ICD-10-CM

## 2019-07-21 DIAGNOSIS — R972 Elevated prostate specific antigen [PSA]: Secondary | ICD-10-CM | POA: Diagnosis not present

## 2019-07-21 DIAGNOSIS — N529 Male erectile dysfunction, unspecified: Secondary | ICD-10-CM | POA: Diagnosis not present

## 2019-07-21 DIAGNOSIS — N401 Enlarged prostate with lower urinary tract symptoms: Secondary | ICD-10-CM

## 2019-07-21 LAB — BLADDER SCAN AMB NON-IMAGING: Scan Result: 73

## 2019-07-21 MED ORDER — FINASTERIDE 5 MG PO TABS: 5.0000 mg | ORAL_TABLET | Freq: Every day | ORAL | 3 refills | Status: DC

## 2019-07-21 NOTE — Progress Notes (Signed)
07/21/2019 2:00 PM   Jacob Darlyn Read Sr. 1934-01-03 [id number]  Referring provider: Birdie Sons, [place],  [place] [address]  Chief Complaint  Patient presents with  . Follow-up    HPI: Jacob TAISHI SLUSSER Sr. is a 86 y.o. male  with an elevated PSA, ED and BPH with LUTS who presents today for follow up.  Elevated PSA PSA Trend  6.0 (12) in 2012 - bx negative - on finasteride  3.6 (7.2) in 09/2011  5.4 (10.8) in 04/2012  3.2 (6.4) in 09/2012  3.6 (7.2) in 04/2013  10.2 in 07/2015 - no finasteride  9.4 in 07/2015 - on finasteride  6.4 in 07/2015 - no finasteride  5.0 in 11/2015  7.7 in 11/2016             9.3 in 11/2017  7.4 in 04/2018 - no finasteride  7.3 in 07/2018 - no finasteride, patient had to stop 06/2018 due to itching; will no longer attempt finasteride  9.3 in 11/2018  12/2018 prostate MRI negative for high grade lesions  PSAD 0.138  7.9 in 07/2019 - restarted the finasteride 5 mg on 05/10/2019  7.9 in 07/2019  He does not have a family history of prostate cancer.      BPH WITH LUTS  (prostate and/or bladder) IPSS score: 19/2    PVR: 73 mL    Previous score: 16/3   Previous PVR: 11 mL  Major complaint(s): Intermittency.  Patient denies any modifying or aggravating factors.  Patient denies any gross hematuria, dysuria or suprapubic/flank pain.  Patient denies any fevers, chills, nausea or vomiting.   Patient is currently taking tamsulosin 0.4 mg daily and finasteride 5 mg daily.  He restarted the finasteride in December and is taking in hydroxyzine once a week due to his itching per his dermatologist.  [place] Name 07/21/19 1000         International Prostate Symptom Score   How often have you had the sensation of not emptying your bladder?  About half the time     How often have you had to urinate less than every two hours?  Less than half the time     How often have you found you stopped and started again several times when  you urinated?  More than half the time     How often have you found it difficult to postpone urination?  About half the time     How often have you had a weak urinary stream?  About half the time     How often have you had to strain to start urination?  Less than half the time     How many times did you typically get up at night to urinate?  2 Times     Total IPSS Score  19       Quality of Life due to urinary symptoms   If you were to spend the rest of your life with your urinary condition just the way it is now how would you feel about that?  Mostly Satisfied       Score:  1-7 Mild 8-19 Moderate 20-35 Severe  Erectile dysfunction SHIM score: 15   Previous SHIM score: 14 Main complaint: Lack of firmness x 2 years Risk factors:  Age, BPH, HTN, HLD, and CKD.   No painful erections or curvatures with his erections.    Still having having spontaneous erections Tried:  Sildenafil  with good results    SHIM    Row Name 07/21/19 1035         SHIM: Over the last 6 months:   How do you rate your confidence that you could get and keep an erection?  Moderate     When you had erections with sexual stimulation, how often were your erections hard enough for penetration (entering your partner)?  Sometimes (about half the time)     During sexual intercourse, how often were you able to maintain your erection after you had penetrated (entered) your partner?  Sometimes (about half the time)     During sexual intercourse, how difficult was it to maintain your erection to completion of intercourse?  Difficult     When you attempted sexual intercourse, how often was it satisfactory for you?  Most Times (much more than half the time)       SHIM Total Score   SHIM  16        Score: 1-7 Severe ED 8-11 Moderate ED 12-16 Mild-Moderate ED 17-21 Mild ED 22-25 No ED  PMH: Past Medical History:  Diagnosis Date  . Arthritis    right hand  . BPH (benign prostatic hyperplasia)   . ED (erectile  dysfunction)   . Elevated PSA   . GERD (gastroesophageal reflux disease)    occasional  . Glaucoma   . Gout   . Gout   . History of alcoholism ([place])   . History of chicken pox   . History of measles   . History of mumps   . History of pernicious anemia   . HTN (hypertension)    controlled  . Hypothyroidism   . Wears partial dentures    upper and lower     Surgical History: Past Surgical History:  Procedure Laterality Date  . EYE SURGERY     laser eye surgery on right eye for glaucoma  . PARATHYROIDECTOMY  2004  . PHOTOCOAGULATION WITH LASER Right 10/07/2015   Procedure: PHOTOCOAGULATION WITH LASER RIGHT EYE TRANS SCLERAL;  Surgeon: Ronnell Freshwater, MD;  Location: [facility];  Service: Ophthalmology;  Laterality: Right;  . TONSILLECTOMY      Home Medications:  Allergies as of 07/21/2019      Reactions   Krystexxa  [pegloticase] Anaphylaxis   Allopurinol    Abdominal pain and itching   Ciprofloxacin    causes mouth to itch   Levofloxacin Other (See Comments)   Tendon pain   Mycophenolate Mofetil    Niacin    Other reaction(s): UNKNOWN   Sulfa Antibiotics Itching   Uloric  [febuxostat]    Other reaction(s): Abdominal pain   Finasteride Itching      Medication List       Accurate as of July 21, 2019 11:59 PM. If you have any questions, ask your nurse or doctor.        allopurinol 100 MG tablet Commonly known as: ZYLOPRIM TAKE 1 TABLET BY MOUTH EVERY DAY   brimonidine 0.2 % ophthalmic solution Commonly known as: ALPHAGAN Place 1 drop into both eyes 2 (two) times daily.   colchicine 0.6 MG tablet TAKE 1 TABLET BY MOUTH EVERY DAY AS NEEDED FOR GOUT   cyanocobalamin 1000 MCG/ML injection Commonly known as: (VITAMIN B-12) INJECT 1 ML (1,000 MCG TOTAL) INTO THE MUSCLE EVERY 30 (THIRTY) DAYS.   dorzolamide-timolol 22.3-6.8 MG/ML ophthalmic solution Commonly known as: COSOPT Place 1 drop into both eyes 2 (two) times daily.   doxazosin  8 MG tablet Commonly known as: CARDURA Take 0.5 tablets (4 mg total) by mouth daily.   finasteride 5 MG tablet Commonly known as: Proscar Take 1 tablet (5 mg total) by mouth daily. Started by: Zara Council, PA-C   fluorouracil 5 % cream Commonly known as: Efudex Apply topically 2 (two) times daily.   hydrOXYzine 25 MG tablet Commonly known as: ATARAX/VISTARIL Take 1 tablet (25 mg total) by mouth 2 (two) times daily as needed for itching.   irbesartan 150 MG tablet Commonly known as: AVAPRO TAKE 1 TABLET BY MOUTH EVERY DAY   latanoprost 0.005 % ophthalmic solution Commonly known as: XALATAN Place 1 drop into both eyes at bedtime.   levothyroxine 150 MCG tablet Commonly known as: SYNTHROID Take 1 tablet (150 mcg total) by mouth daily at 6 (six) AM.   multivitamin tablet Take 1 tablet by mouth daily.   naproxen 375 MG tablet Commonly known as: NAPROSYN Take 1 tablet (375 mg total) by mouth 2 (two) times daily as needed.   Needles & Syringes Misc Use one each month for vitamin b12 injection   predniSONE 10 MG tablet Commonly known as: DELTASONE Take 1/2 to 1 tablet twice daily as needed for gout flares   probenecid 500 MG tablet Commonly known as: BENEMID TAKE 0.5 TABLETS (250 MG TOTAL) BY MOUTH TWICE DAILY   sildenafil 20 MG tablet Commonly known as: REVATIO Take 3 to 5 tablets two hours before intercouse on an empty stomach.  Do not take with nitrates.   tamsulosin 0.4 MG Caps capsule Commonly known as: FLOMAX TAKE 1 CAPSULE BY MOUTH EVERY DAY   vitamin C 100 MG tablet Take by mouth daily. Dose unsure   Vitamin D3 10 MCG (400 UNIT) tablet Take by mouth daily. Unsure dose       Allergies:  Allergies  Allergen Reactions  . Krystexxa  [Pegloticase] Anaphylaxis  . Allopurinol     Abdominal pain and itching  . Ciprofloxacin     causes mouth to itch  . Levofloxacin Other (See Comments)    Tendon pain  . Mycophenolate Mofetil   . Niacin     Other  reaction(s): UNKNOWN  . Sulfa Antibiotics Itching  . Uloric  [Febuxostat]     Other reaction(s): Abdominal pain  . Finasteride Itching    Family History: Family History  Problem Relation Age of Onset  . Asthma Mother   . Heart attack Father   . Parkinson's disease Father   . Kidney disease Neg Hx   . Prostate cancer Neg Hx        maybe paternal grandfather ? unsure    Social History:  reports that he quit smoking about 41 years ago. His smoking use included cigarettes. He has a 30.00 pack-year smoking history. He has never used smokeless tobacco. He reports that he does not drink alcohol or use drugs.  ROS: For pertinent review of systems please refer to history of present illness  Physical Exam: BP (!) 173/77   Pulse 64   Ht 5\' 11"  (1.803 m)   Wt 185 lb (83.9 kg)   BMI 25.80 kg/m   Constitutional:  Well nourished. Alert and oriented, No acute distress. HEENT: [place] AT, mask in place.  Trachea midline, no masses. Cardiovascular: No clubbing, cyanosis, or edema. Respiratory: Normal respiratory effort, no increased work of breathing. GI: Abdomen is soft, non tender, non distended, no abdominal masses.  GU: No CVA tenderness.  No bladder fullness or masses.  Patient with  uncircumcised phallus.  Foreskin easily retracted  Urethral meatus is patent.  No penile discharge. No penile lesions or rashes. Scrotum without lesions, cysts, rashes and/or edema.  Testicles are located scrotally bilaterally. No masses are appreciated in the testicles. Left and right epididymis are normal. Rectal: Patient with  normal sphincter tone. Anus and perineum without scarring or rashes. No rectal masses are appreciated. Prostate is approximately 60 grams, could only palpate the apex and the midportion of the gland, no nodules are appreciated. Seminal vesicles could not be palpated.   Skin: No rashes, bruises or suspicious lesions. Lymph: No inguinal adenopathy. Neurologic: Grossly intact, no focal  deficits, moving all 4 extremities. Psychiatric: Normal mood and affect.  Laboratory Data:   Lab Results  Component Value Date   WBC 6.6 08/16/2018   HGB 13.4 08/16/2018   HCT 39.4 08/16/2018   MCV 87 08/16/2018   PLT 211 08/16/2018   Lab Results  Component Value Date   CREATININE 1.50 (H) 01/12/2019   Lab Results  Component Value Date   TSH 0.137 (L) 10/17/2018      Component Value Date/Time   CHOL 164 07/26/2017 1058   HDL 41 07/26/2017 1058   CHOLHDL 4.0 07/26/2017 1058   LDLCALC 106 (H) 07/26/2017 1058   Lab Results  Component Value Date   AST 18 07/03/2015   Lab Results  Component Value Date   ALT 11 07/03/2015   PSA Trend  See HPI I have reviewed the labs.  Assessment & Plan:    1. Elevated PSA - Current PSA 7.9 - neg prostate MRI 12/2018 - PSAD 0.138 - Patient would like to continue close monitoring of his PSA - RTC in 6 months for PSA  2. BPH with LUTS - IPSS score is 19/2, it is worsened - not interested in any further treatments than the medication - continue tamsulosin 0.4 mg daily and finasteride 5 mg daily - RTC in 6 months for I PSS, PSA and PVR   3. Erectile dysfunction:  - SHIM score is 16, it is improved - Continue sildenafil as needed - RTC in 6 months for SHIM score and exam  Return in about 6 months (around 01/18/2020) for IPSS, SHIM, PSA and exam.  Zara Council, [facility]  [address], [place] [address] [phone]

## 2019-07-25 DIAGNOSIS — H401133 Primary open-angle glaucoma, bilateral, severe stage: Secondary | ICD-10-CM | POA: Diagnosis not present

## 2019-08-22 ENCOUNTER — Ambulatory Visit (INDEPENDENT_AMBULATORY_CARE_PROVIDER_SITE_OTHER): Payer: PPO | Admitting: Family Medicine

## 2019-08-22 ENCOUNTER — Encounter: Payer: Self-pay | Admitting: Family Medicine

## 2019-08-22 ENCOUNTER — Other Ambulatory Visit: Payer: Self-pay

## 2019-08-22 VITALS — BP 124/68 | HR 74 | Temp 97.1°F | Resp 16 | Wt 192.0 lb

## 2019-08-22 DIAGNOSIS — I10 Essential (primary) hypertension: Secondary | ICD-10-CM

## 2019-08-22 DIAGNOSIS — N1831 Chronic kidney disease, stage 3a: Secondary | ICD-10-CM | POA: Diagnosis not present

## 2019-08-22 DIAGNOSIS — M109 Gout, unspecified: Secondary | ICD-10-CM | POA: Diagnosis not present

## 2019-08-22 DIAGNOSIS — E039 Hypothyroidism, unspecified: Secondary | ICD-10-CM

## 2019-08-22 NOTE — Progress Notes (Signed)
Patient: Jacob BURKER Sr.   DOB: 1933/08/22   86 y.o. Male  MRN: [id number] Visit Date: 08/22/2019  Today's Provider: Lelon Huh, MD  Subjective:    Chief Complaint  Patient presents with  . Hypertension  . Hypothyroidism   HPI  Hypertension, follow-up:     BP Readings from Last 3 Encounters:  10/17/18 (!) 148/68  09/14/18 (!) 171/80  08/16/18 140/70    He was last seen for hypertension 6 months ago.  BP at that visit was 148/68. Management since that visit includes no change Intolerant to higher doses of tamsulosin, but now back on doxazosin  He reports good compliance with treatment. He is having side effects. He is not exercising. He is adherent to low salt diet.   Home blood pressure readings have averaged 150/80 Patient denies exertional chest pressure/discomfort, lower extremity edema and palpitations.  Weight trend: stable    Wt Readings from Last 3 Encounters:  10/17/18 185 lb (83.9 kg)  09/14/18 186 lb (84.4 kg)  08/16/18 194 lb (88 kg)   Current diet: well balanced   Hypothyroid, follow-up:  TSH  Date Value Ref Range Status  10/17/2018 0.137 (L) 0.450 - 4.500 uIU/mL Final  08/16/2018 12.050 (H) 0.450 - 4.500 uIU/mL Final  07/26/2017 6.330 (H) 0.450 - 4.500 uIU/mL Final   Wt Readings from Last 3 Encounters:  08/22/19 192 lb (87.1 kg)  07/21/19 185 lb (83.9 kg)  02/21/19 187 lb 9.6 oz (85.1 kg)    He was last seen for hypothyroid 10 months ago.  Management since that visit includes no change. He reports good compliance with treatment. He is not having side effects.  He is not exercising. He is experiencing none He denies change in energy level, diarrhea, heat / cold intolerance, nervousness, palpitations and weight changes Weight trend: stable  ------------------------------------------------------------------------     Medications: Outpatient Medications Prior to Visit  Medication Sig Note  . Ascorbic Acid (VITAMIN C)  100 MG tablet Take by mouth daily. Dose unsure   . brimonidine (ALPHAGAN) 0.2 % ophthalmic solution Place 1 drop into both eyes 2 (two) times daily.   . Cholecalciferol (VITAMIN D3) 10 MCG (400 UNIT) tablet Take by mouth daily. Unsure dose   . colchicine 0.6 MG tablet TAKE 1 TABLET BY MOUTH EVERY DAY AS NEEDED FOR GOUT   . cyanocobalamin (,VITAMIN B-12,) 1000 MCG/ML injection INJECT 1 ML (1,000 MCG TOTAL) INTO THE MUSCLE EVERY 30 (THIRTY) DAYS.   Marland Kitchen dorzolamide-timolol (COSOPT) 22.3-6.8 MG/ML ophthalmic solution Place 1 drop into both eyes 2 (two) times daily.    Marland Kitchen doxazosin (CARDURA) 1 MG tablet Take 1 mg by mouth daily. 08/22/2019: Taking his wifes pills  . doxazosin (CARDURA) 8 MG tablet Take 0.5 tablets (4 mg total) by mouth daily.   . hydrOXYzine (ATARAX/VISTARIL) 25 MG tablet Take 1 tablet (25 mg total) by mouth 2 (two) times daily as needed for itching.   . irbesartan (AVAPRO) 150 MG tablet TAKE 1 TABLET BY MOUTH EVERY DAY   . latanoprost (XALATAN) 0.005 % ophthalmic solution Place 1 drop into both eyes at bedtime.    Marland Kitchen levothyroxine (SYNTHROID) 150 MCG tablet Take 1 tablet (150 mcg total) by mouth daily at 6 (six) AM.   . Multiple Vitamin (MULTIVITAMIN) tablet Take 1 tablet by mouth daily.   . Needles & Syringes MISC Use one each month for vitamin b12 injection   . probenecid (BENEMID) 500 MG tablet TAKE 0.5 TABLETS (250  MG TOTAL) BY MOUTH TWICE DAILY   . sildenafil (REVATIO) 20 MG tablet Take 3 to 5 tablets two hours before intercouse on an empty stomach.  Do not take with nitrates.   . [DISCONTINUED] allopurinol (ZYLOPRIM) 100 MG tablet TAKE 1 TABLET BY MOUTH EVERY DAY   . finasteride (PROSCAR) 5 MG tablet Take 1 tablet (5 mg total) by mouth daily. (Patient not taking: Reported on 08/22/2019)   . [DISCONTINUED] fluorouracil (EFUDEX) 5 % cream Apply topically 2 (two) times daily. (Patient not taking: Reported on 08/22/2019)   . [DISCONTINUED] naproxen (NAPROSYN) 375 MG tablet Take 1 tablet  (375 mg total) by mouth 2 (two) times daily as needed. (Patient not taking: Reported on 08/22/2019)   . [DISCONTINUED] predniSONE (DELTASONE) 10 MG tablet Take 1/2 to 1 tablet twice daily as needed for gout flares (Patient not taking: Reported on 08/22/2019)   . [DISCONTINUED] tamsulosin (FLOMAX) 0.4 MG CAPS capsule TAKE 1 CAPSULE BY MOUTH EVERY DAY (Patient not taking: Reported on 08/30/2019) 08/30/2019: WAS PREVIOUSLY CHANGED TO DOXAZOSIN   No facility-administered medications prior to visit.     Review of Systems  Constitutional: Negative.  Negative for appetite change, chills and fever.  Respiratory: Negative.  Negative for chest tightness, shortness of breath and wheezing.   Cardiovascular: Negative.  Negative for chest pain and palpitations.  Gastrointestinal: Negative for abdominal pain, nausea and vomiting.  Musculoskeletal: Positive for joint swelling (occasionally in right hand).        Objective:   BP 124/68 (BP Location: Right Arm, Cuff Size: Normal)   Pulse 74   Temp (!) 97.1 F (36.2 C) (Temporal)   Resp 16   Wt 192 lb (87.1 kg)   SpO2 97% Comment: room air  BMI 26.78 kg/m  Vitals:   08/22/19 1334 08/22/19 1344  BP: 122/60 124/68  Pulse: 74   Resp: 16   Temp: (!) 97.1 F (36.2 C)   TempSrc: Temporal   SpO2: 97%   Weight: 192 lb (87.1 kg)      Physical Exam    General: Appearance:     Overweight male in no acute distress  Eyes:    PERRL, conjunctiva/corneas clear, EOM's intact       Lungs:     Clear to auscultation bilaterally, respirations unlabored  Heart:    Normal heart rate. Normal rhythm. No murmurs, rubs, or gallops.   MS:   All extremities are intact.   Neurologic:   Awake, alert, oriented x 3. No apparent focal neurological           defect.        No results found for any visits on 08/22/19.    Assessment & Plan:    1. Essential (primary) hypertension Well controlled.  Continue current medications.  Tolerating current regiment of doxazosin  well.  - Comprehensive metabolic panel - Lipid panel  2. Hypothyroidism, unspecified type Due to check - TSH  3. Stage 3a chronic kidney disease Stable.   4. Gouty arthritis Having occasional mild flare on current dose of allopurinol. Consider increasing if urica acic >5 - Uric acid     The entirety of the information documented in the History of Present Illness, Review of Systems and Physical Exam were personally obtained by me. Portions of this information were initially documented by Idelle Jo, CMA and reviewed by me for thoroughness and accuracy.   Lelon Huh, MD  [facility] [phone] (phone) [phone] (fax)  [place]

## 2019-08-23 ENCOUNTER — Telehealth: Payer: Self-pay

## 2019-08-23 LAB — LIPID PANEL
Chol/HDL Ratio: 4 ratio (ref 0.0–5.0)
Cholesterol, Total: 152 mg/dL (ref 100–199)
HDL: 38 mg/dL — ABNORMAL LOW (ref >39)
LDL Chol Calc (NIH): 87 mg/dL (ref 0–99)
Triglycerides: 154 mg/dL — ABNORMAL HIGH (ref 0–149)
VLDL Cholesterol Cal: 27 mg/dL (ref 5–40)

## 2019-08-23 LAB — COMPREHENSIVE METABOLIC PANEL
ALT: 10 IU/L (ref 0–44)
AST: 16 IU/L (ref 0–40)
Albumin/Globulin Ratio: 1.9 (ref 1.2–2.2)
Albumin: 4.2 g/dL (ref 3.6–4.6)
Alkaline Phosphatase: 90 IU/L (ref 39–117)
BUN/Creatinine Ratio: 16 (ref 10–24)
BUN: 23 mg/dL (ref 8–27)
Bilirubin Total: 0.4 mg/dL (ref 0.0–1.2)
CO2: 23 mmol/L (ref 20–29)
Calcium: 9.5 mg/dL (ref 8.6–10.2)
Chloride: 104 mmol/L (ref 96–106)
Creatinine, Ser: 1.4 mg/dL — ABNORMAL HIGH (ref 0.76–1.27)
GFR calc Af Amer: 53 mL/min/{1.73_m2} — ABNORMAL LOW (ref >59)
GFR calc non Af Amer: 46 mL/min/{1.73_m2} — ABNORMAL LOW (ref >59)
Globulin, Total: 2.2 g/dL (ref 1.5–4.5)
Glucose: 94 mg/dL (ref 65–99)
Potassium: 4.6 mmol/L (ref 3.5–5.2)
Sodium: 142 mmol/L (ref 134–144)
Total Protein: 6.4 g/dL (ref 6.0–8.5)

## 2019-08-23 LAB — URIC ACID: Uric Acid: 7.2 mg/dL (ref 3.8–8.4)

## 2019-08-23 LAB — TSH: TSH: 0.159 u[IU]/mL — ABNORMAL LOW (ref 0.450–4.500)

## 2019-08-23 MED ORDER — ALLOPURINOL 100 MG PO TABS: 100.0000 mg | ORAL_TABLET | Freq: Two times a day (BID) | ORAL | 3 refills | Status: DC

## 2019-08-23 NOTE — Telephone Encounter (Signed)
Patient advised. RX sent to [facility] pharmacy. 6 month follow up scheduled

## 2019-08-23 NOTE — Telephone Encounter (Signed)
-----   Message from Birdie Sons, MD sent at 08/23/2019  7:47 AM EDT ----- Kidney functions improved, but still at about 65% of normal. Be sure to drink plenty of water.  Uric acid level is a little bit high. He should double up on allopurinol to 2 x 100mg  tablets daily to reduce gout flares. Can send in new prescription for #60 rf x 3. Follow up office visit/bp check in 6 months.

## 2019-08-29 DIAGNOSIS — H2000 Unspecified acute and subacute iridocyclitis: Secondary | ICD-10-CM | POA: Diagnosis not present

## 2019-09-08 DIAGNOSIS — H401133 Primary open-angle glaucoma, bilateral, severe stage: Secondary | ICD-10-CM | POA: Diagnosis not present

## 2019-10-16 DIAGNOSIS — L821 Other seborrheic keratosis: Secondary | ICD-10-CM | POA: Diagnosis not present

## 2019-10-16 DIAGNOSIS — L820 Inflamed seborrheic keratosis: Secondary | ICD-10-CM | POA: Diagnosis not present

## 2019-10-16 DIAGNOSIS — L298 Other pruritus: Secondary | ICD-10-CM | POA: Diagnosis not present

## 2019-10-16 DIAGNOSIS — L570 Actinic keratosis: Secondary | ICD-10-CM | POA: Diagnosis not present

## 2019-10-18 ENCOUNTER — Other Ambulatory Visit: Payer: Self-pay | Admitting: Adult Health

## 2019-10-18 ENCOUNTER — Other Ambulatory Visit: Payer: Self-pay | Admitting: Family Medicine

## 2019-10-18 DIAGNOSIS — M109 Gout, unspecified: Secondary | ICD-10-CM

## 2019-10-18 MED ORDER — PREDNISONE 10 MG PO TABS: ORAL_TABLET | ORAL | 0 refills | Status: DC

## 2019-10-18 NOTE — Telephone Encounter (Signed)
Requested Prescriptions  Pending Prescriptions Disp Refills  . levothyroxine (SYNTHROID) 150 MCG tablet [Pharmacy Med Name: LEVOTHYROXINE 150 MCG TABLET] 90 tablet 1    Sig: TAKE 1 TABLET (150 MCG TOTAL) BY MOUTH DAILY AT 6 (SIX) AM.     Endocrinology:  Hypothyroid Agents Failed - 10/18/2019  1:19 AM      Failed - TSH needs to be rechecked within 3 months after an abnormal result. Refill until TSH is due.      Failed - TSH in normal range and within 360 days    TSH  Date Value Ref Range Status  08/22/2019 0.159 (L) 0.450 - 4.500 uIU/mL Final         Passed - Valid encounter within last 12 months    Recent Outpatient Visits          1 month ago Essential (primary) hypertension   [facility] Birdie Sons, MD   7 months ago Need for influenza vaccination   [facility] Birdie Sons, MD   1 year ago Essential (primary) hypertension   [facility] Birdie Sons, MD   1 year ago Essential (primary) hypertension   [facility] Birdie Sons, MD   1 year ago Annual physical exam   [facility] Birdie Sons, MD      Future Appointments            In 3 months McGowan, Gordan Payment [place]   In 4 months Caryn Section, Kirstie Peri, MD [facility]           Patient to f/u in 4 months.  TSH is higher than previous check. No changes in therapy at this time per notes.

## 2019-10-18 NOTE — Progress Notes (Signed)
Patient came to office his wife and  with bottle of prednisone has two pill left, takes only if gout flare. On allopurinol. Denies any gout flare currently. He is aware of use with caution.  Follow up office visit recommended within one month with PCP advised. Advised next time contact pharmacy to contact office per office protocol.

## 2019-10-20 ENCOUNTER — Other Ambulatory Visit: Payer: Self-pay | Admitting: Family Medicine

## 2019-10-20 DIAGNOSIS — M109 Gout, unspecified: Secondary | ICD-10-CM

## 2019-10-24 DIAGNOSIS — N189 Chronic kidney disease, unspecified: Secondary | ICD-10-CM | POA: Diagnosis not present

## 2019-10-24 DIAGNOSIS — M109 Gout, unspecified: Secondary | ICD-10-CM | POA: Diagnosis not present

## 2019-10-24 DIAGNOSIS — Z7952 Long term (current) use of systemic steroids: Secondary | ICD-10-CM | POA: Diagnosis not present

## 2019-10-24 DIAGNOSIS — H401113 Primary open-angle glaucoma, right eye, severe stage: Secondary | ICD-10-CM | POA: Diagnosis not present

## 2019-10-24 DIAGNOSIS — E039 Hypothyroidism, unspecified: Secondary | ICD-10-CM | POA: Diagnosis not present

## 2019-10-24 DIAGNOSIS — I129 Hypertensive chronic kidney disease with stage 1 through stage 4 chronic kidney disease, or unspecified chronic kidney disease: Secondary | ICD-10-CM | POA: Diagnosis not present

## 2019-10-24 DIAGNOSIS — Z87891 Personal history of nicotine dependence: Secondary | ICD-10-CM | POA: Diagnosis not present

## 2019-10-24 DIAGNOSIS — M1990 Unspecified osteoarthritis, unspecified site: Secondary | ICD-10-CM | POA: Diagnosis not present

## 2019-10-24 DIAGNOSIS — Z79899 Other long term (current) drug therapy: Secondary | ICD-10-CM | POA: Diagnosis not present

## 2019-10-24 DIAGNOSIS — Z882 Allergy status to sulfonamides status: Secondary | ICD-10-CM | POA: Diagnosis not present

## 2019-10-24 DIAGNOSIS — Z888 Allergy status to other drugs, medicaments and biological substances status: Secondary | ICD-10-CM | POA: Diagnosis not present

## 2019-10-24 HISTORY — PX: EYE SURGERY: SHX253

## 2019-10-25 ENCOUNTER — Encounter: Payer: Self-pay | Admitting: Family Medicine

## 2019-11-17 ENCOUNTER — Other Ambulatory Visit: Payer: Self-pay | Admitting: Family Medicine

## 2020-01-15 ENCOUNTER — Other Ambulatory Visit: Payer: Self-pay | Admitting: Family Medicine

## 2020-01-15 NOTE — Telephone Encounter (Signed)
Requested medication (s) are due for refill today: yes  Requested medication (s) are on the active medication list: yes  Last refill:  10/19/2019  Future visit scheduled: yes  Notes to clinic:  Medication not assigned to a protocol, review manually   Requested Prescriptions  Pending Prescriptions Disp Refills   cyanocobalamin (,VITAMIN B-12,) 1000 MCG/ML injection [Pharmacy Med Name: CYANOCOBALAMIN 1,000 MCG/ML] 3 mL 1    Sig: INJECT 1 ML (1,000 MCG TOTAL) INTO THE MUSCLE EVERY 30 (THIRTY) DAYS.      Off-Protocol Failed - 01/15/2020  1:34 AM      Failed - Medication not assigned to a protocol, review manually.      Passed - Valid encounter within last 12 months    Recent Outpatient Visits           4 months ago Essential (primary) hypertension   [facility] Birdie Sons, MD   10 months ago Need for influenza vaccination   [facility] Birdie Sons, MD   1 year ago Essential (primary) hypertension   [facility] Birdie Sons, MD   1 year ago Essential (primary) hypertension   [facility] Birdie Sons, MD   1 year ago Annual physical exam   [place], MD       Future Appointments             In 1 week McGowan, Gordan Payment [place]   In 1 month Caryn Section, Kirstie Peri, MD [facility]           Off-Protocol Failed - 01/15/2020  1:34 AM      Failed - Medication not assigned to a protocol, review manually.      Passed - Valid encounter within last 12 months    Recent Outpatient Visits           4 months ago Essential (primary) hypertension   [facility] Birdie Sons, MD   10 months ago Need for influenza vaccination   [facility] Birdie Sons, MD   1 year ago Essential (primary) hypertension   [facility] Birdie Sons, MD   1 year ago Essential (primary)  hypertension   [facility] Birdie Sons, MD   1 year ago Annual physical exam   [place], Kirstie Peri, MD       Future Appointments             In 1 week McGowan, Gordan Payment [place]   In 1 month [place], Kirstie Peri, MD [facility] [place]

## 2020-01-23 ENCOUNTER — Other Ambulatory Visit: Payer: Self-pay

## 2020-01-23 DIAGNOSIS — R972 Elevated prostate specific antigen [PSA]: Secondary | ICD-10-CM

## 2020-01-24 ENCOUNTER — Other Ambulatory Visit: Payer: Self-pay

## 2020-01-24 ENCOUNTER — Other Ambulatory Visit: Payer: PPO

## 2020-01-24 DIAGNOSIS — R972 Elevated prostate specific antigen [PSA]: Secondary | ICD-10-CM

## 2020-01-24 NOTE — Progress Notes (Deleted)
01/25/2020 8:28 AM   Yonatan Darlyn Read Sr. 01/06/1934 [id number]  Referring provider: Birdie Sons, MD [address] [place],  [place] [address]  No chief complaint on file.   HPI: AKASHDEEP CHUBA Sr. is a 86 y.o. male  with an elevated PSA, ED and BPH with LUTS who presents today for follow up.  Elevated PSA PSA Trend  6.0 (12) in 2012 - bx negative - on finasteride  3.6 (7.2) in 09/2011  5.4 (10.8) in 04/2012  3.2 (6.4) in 09/2012  3.6 (7.2) in 04/2013  10.2 in 07/2015 - no finasteride  9.4 in 07/2015 - on finasteride  6.4 in 07/2015 - no finasteride  5.0 in 11/2015  7.7 in 11/2016             9.3 in 11/2017  7.4 in 04/2018 - no finasteride  7.3 in 07/2018 - no finasteride, patient had to stop 06/2018 due to itching; will no longer attempt finasteride  9.3 in 11/2018  12/2018 prostate MRI negative for high grade lesions  PSAD 0.138  7.9 in 07/2019 - restarted the finasteride 5 mg on 05/10/2019  7.9 in 07/2019  He does not have a family history of prostate cancer.      BPH WITH LUTS  (prostate and/or bladder) IPSS score: ***   PVR: *** mL    Previous score: 19/2   Previous PVR: 73 mL  Major complaint(s): Intermittency.  Patient denies any modifying or aggravating factors.  Patient denies any gross hematuria, dysuria or suprapubic/flank pain.  Patient denies any fevers, chills, nausea or vomiting.   Patient is currently taking tamsulosin 0.4 mg daily and finasteride 5 mg daily.  He restarted the finasteride in December and is taking in hydroxyzine once a week due to his itching per his dermatologist.   Score:  1-7 Mild 8-19 Moderate 20-35 Severe  Erectile dysfunction SHIM score: ***   Previous SHIM score: 15 Main complaint: Lack of firmness  Risk factors:  Age, BPH, HTN, HLD, and CKD.   No painful erections or curvatures with his erections.    Still having having spontaneous erections Tried:  Sildenafil with good results      Score: 1-7 Severe  ED 8-11 Moderate ED 12-16 Mild-Moderate ED 17-21 Mild ED 22-25 No ED  PMH: Past Medical History:  Diagnosis Date  . Arthritis    right hand  . BPH (benign prostatic hyperplasia)   . ED (erectile dysfunction)   . Elevated PSA   . GERD (gastroesophageal reflux disease)    occasional  . Glaucoma   . Gout   . Gout   . History of alcoholism ([place])   . History of chicken pox   . History of measles   . History of mumps   . History of pernicious anemia   . HTN (hypertension)    controlled  . Hypothyroidism   . Wears partial dentures    upper and lower     Surgical History: Past Surgical History:  Procedure Laterality Date  . EYE SURGERY Right 10/24/2019   laser eye surgery on right eye for glaucoma  . PARATHYROIDECTOMY  2004  . PHOTOCOAGULATION WITH LASER Right 10/07/2015   Procedure: PHOTOCOAGULATION WITH LASER RIGHT EYE TRANS SCLERAL;  Surgeon: Ronnell Freshwater, MD;  Location: [place];  Service: Ophthalmology;  Laterality: Right;  . TONSILLECTOMY      Home Medications:  Allergies as of 01/25/2020      Reactions   Krystexxa  [  pegloticase] Anaphylaxis   Allopurinol    Abdominal pain and itching   Ciprofloxacin    causes mouth to itch   Levofloxacin Other (See Comments)   Tendon pain   Mycophenolate Mofetil    Niacin    Other reaction(s): UNKNOWN   Sulfa Antibiotics Itching   Uloric  [febuxostat]    Other reaction(s): Abdominal pain   Finasteride Itching      Medication List       Accurate as of January 24, 2020  8:28 AM. If you have any questions, ask your nurse or doctor.        allopurinol 100 MG tablet Commonly known as: ZYLOPRIM TAKE 1 TABLET BY MOUTH TWICE A DAY   brimonidine 0.2 % ophthalmic solution Commonly known as: ALPHAGAN Place 1 drop into both eyes 2 (two) times daily.   colchicine 0.6 MG tablet TAKE 1 TABLET BY MOUTH EVERY DAY AS NEEDED FOR GOUT   cyanocobalamin 1000 MCG/ML injection Commonly known as: (VITAMIN  B-12) INJECT 1 ML (1,000 MCG TOTAL) INTO THE MUSCLE EVERY 30 (THIRTY) DAYS.   dorzolamide-timolol 22.3-6.8 MG/ML ophthalmic solution Commonly known as: COSOPT Place 1 drop into both eyes 2 (two) times daily.   doxazosin 1 MG tablet Commonly known as: CARDURA Take 1 mg by mouth daily.   doxazosin 8 MG tablet Commonly known as: CARDURA Take 0.5 tablets (4 mg total) by mouth daily.   finasteride 5 MG tablet Commonly known as: Proscar Take 1 tablet (5 mg total) by mouth daily.   hydrOXYzine 25 MG tablet Commonly known as: ATARAX/VISTARIL Take 1 tablet (25 mg total) by mouth 2 (two) times daily as needed for itching.   irbesartan 150 MG tablet Commonly known as: AVAPRO TAKE 1 TABLET BY MOUTH EVERY DAY   latanoprost 0.005 % ophthalmic solution Commonly known as: XALATAN Place 1 drop into both eyes at bedtime.   levothyroxine 150 MCG tablet Commonly known as: SYNTHROID TAKE 1 TABLET (150 MCG TOTAL) BY MOUTH DAILY AT 6 (SIX) AM.   multivitamin tablet Take 1 tablet by mouth daily.   Needles & Syringes Misc Use one each month for vitamin b12 injection   predniSONE 10 MG tablet Commonly known as: DELTASONE Take 1/2 to 1 tablet twice daily as needed for gout flares   probenecid 500 MG tablet Commonly known as: BENEMID TAKE 0.5 TABLETS (250 MG TOTAL) BY MOUTH TWICE DAILY   sildenafil 20 MG tablet Commonly known as: REVATIO Take 3 to 5 tablets two hours before intercouse on an empty stomach.  Do not take with nitrates.   vitamin C 100 MG tablet Take by mouth daily. Dose unsure   Vitamin D3 10 MCG (400 UNIT) tablet Take by mouth daily. Unsure dose       Allergies:  Allergies  Allergen Reactions  . Krystexxa  [Pegloticase] Anaphylaxis  . Allopurinol     Abdominal pain and itching  . Ciprofloxacin     causes mouth to itch  . Levofloxacin Other (See Comments)    Tendon pain  . Mycophenolate Mofetil   . Niacin     Other reaction(s): UNKNOWN  . Sulfa Antibiotics  Itching  . Uloric  [Febuxostat]     Other reaction(s): Abdominal pain  . Finasteride Itching    Family History: Family History  Problem Relation Age of Onset  . Asthma Mother   . Heart attack Father   . Parkinson's disease Father   . Kidney disease Neg Hx   . Prostate cancer Neg Hx  maybe paternal grandfather ? unsure    Social History:  reports that he quit smoking about 41 years ago. His smoking use included cigarettes. He has a 30.00 pack-year smoking history. He has never used smokeless tobacco. He reports that he does not drink alcohol and does not use drugs.  ROS: For pertinent review of systems please refer to history of present illness  Physical Exam: There were no vitals taken for this visit.  Constitutional:  Well nourished. Alert and oriented, No acute distress. HEENT: [place] AT, mask in place.  Trachea midline, no masses. Cardiovascular: No clubbing, cyanosis, or edema. Respiratory: Normal respiratory effort, no increased work of breathing. GI: Abdomen is soft, non tender, non distended, no abdominal masses.  GU: No CVA tenderness.  No bladder fullness or masses.  Patient with uncircumcised phallus.  Foreskin easily retracted  Urethral meatus is patent.  No penile discharge. No penile lesions or rashes. Scrotum without lesions, cysts, rashes and/or edema.  Testicles are located scrotally bilaterally. No masses are appreciated in the testicles. Left and right epididymis are normal. Rectal: Patient with  normal sphincter tone. Anus and perineum without scarring or rashes. No rectal masses are appreciated. Prostate is approximately 60 grams, could only palpate the apex and the midportion of the gland, no nodules are appreciated. Seminal vesicles could not be palpated.   Skin: No rashes, bruises or suspicious lesions. Lymph: No inguinal adenopathy. Neurologic: Grossly intact, no focal deficits, moving all 4 extremities. Psychiatric: Normal mood and affect.  Laboratory  Data:   Lab Results  Component Value Date   WBC 6.6 08/16/2018   HGB 13.4 08/16/2018   HCT 39.4 08/16/2018   MCV 87 08/16/2018   PLT 211 08/16/2018   Lab Results  Component Value Date   CREATININE 1.40 (H) 08/22/2019   Lab Results  Component Value Date   TSH 0.159 (L) 08/22/2019      Component Value Date/Time   CHOL 152 08/22/2019 1418   HDL 38 (L) 08/22/2019 1418   CHOLHDL 4.0 08/22/2019 1418   LDLCALC 87 08/22/2019 1418   Lab Results  Component Value Date   AST 16 08/22/2019   Lab Results  Component Value Date   ALT 10 08/22/2019   PSA Trend  See HPI I have reviewed the labs.  Assessment & Plan:    1. Elevated PSA - Current PSA 7.9 - neg prostate MRI 12/2018 - PSAD 0.138 - Patient would like to continue close monitoring of his PSA - RTC in 6 months for PSA  2. BPH with LUTS - IPSS score is 19/2, it is worsened - not interested in any further treatments than the medication - continue tamsulosin 0.4 mg daily and finasteride 5 mg daily - RTC in 6 months for I PSS, PSA and PVR   3. Erectile dysfunction:  - SHIM score is 16, it is improved - Continue sildenafil as needed - RTC in 6 months for SHIM score and exam  No follow-ups on file.  Zara Council, PA-C  [facility] [address] [place], [place] [address] [phone]

## 2020-01-25 ENCOUNTER — Ambulatory Visit (INDEPENDENT_AMBULATORY_CARE_PROVIDER_SITE_OTHER): Payer: PPO | Admitting: Urology

## 2020-01-25 ENCOUNTER — Encounter: Payer: Self-pay | Admitting: Urology

## 2020-01-25 ENCOUNTER — Ambulatory Visit: Payer: Self-pay | Admitting: Urology

## 2020-01-25 VITALS — BP 152/85 | HR 72 | Ht 71.0 in | Wt 185.0 lb

## 2020-01-25 DIAGNOSIS — N529 Male erectile dysfunction, unspecified: Secondary | ICD-10-CM | POA: Diagnosis not present

## 2020-01-25 DIAGNOSIS — N138 Other obstructive and reflux uropathy: Secondary | ICD-10-CM | POA: Diagnosis not present

## 2020-01-25 DIAGNOSIS — N401 Enlarged prostate with lower urinary tract symptoms: Secondary | ICD-10-CM

## 2020-01-25 LAB — PSA: Prostate Specific Ag, Serum: 3.4 ng/mL (ref 0.0–4.0)

## 2020-01-25 LAB — BLADDER SCAN AMB NON-IMAGING: Scan Result: 79

## 2020-01-25 MED ORDER — NYSTATIN 100000 UNIT/GM EX POWD
1.0000 | Freq: Three times a day (TID) | CUTANEOUS | 0 refills | Status: DC
Start: 2020-01-25 — End: 2020-05-28

## 2020-01-25 NOTE — Progress Notes (Signed)
01/25/2020 3:46 PM   [name] 1933/06/05 [id number]  Referring provider: Birdie Sons, Elizabeth [address] [place],  [place] [address]  Chief Complaint  Patient presents with  . Benign Prostatic Hypertrophy    HPI: Jacob SILBERNAGEL Sr. is a 86 y.o. male  with an elevated PSA, ED and BPH with LUTS who presents today for follow up.  Elevated PSA PSA Trend  6.0 (12) in 2012 - bx negative - on finasteride  3.6 (7.2) in 09/2011  5.4 (10.8) in 04/2012  3.2 (6.4) in 09/2012  3.6 (7.2) in 04/2013  10.2 in 07/2015 - no finasteride  9.4 in 07/2015 - on finasteride  6.4 in 07/2015 - no finasteride  5.0 in 11/2015  7.7 in 11/2016             9.3 in 11/2017  7.4 in 04/2018 - no finasteride  7.3 in 07/2018 - no finasteride, patient had to stop 06/2018 due to itching; will no longer attempt finasteride  9.3 in 11/2018  12/2018 prostate MRI negative for high grade lesions  PSAD 0.138  7.9 in 07/2019 - restarted the finasteride 5 mg on 05/10/2019  7.9 in 07/2019   3.4 (6.8) in 12/2019 He does not have a family history of prostate cancer.      BPH WITH LUTS  (prostate and/or bladder) IPSS score: 15/2   PVR: 79 mL    Previous score: 19/2   Previous PVR: 73 mL  Major complaint(s): No complaints.  Patient denies any modifying or aggravating factors.  Patient denies any gross hematuria, dysuria or suprapubic/flank pain.  Patient denies any fevers, chills, nausea or vomiting.   Patient is currently taking tamsulosin 0.4 mg daily and finasteride 5 mg daily.     IPSS    Row Name 01/25/20 1500         International Prostate Symptom Score   How often have you had the sensation of not emptying your bladder? Less than half the time     How often have you had to urinate less than every two hours? Less than half the time     How often have you found you stopped and started again several times when you urinated? Less than 1 in 5 times     How often have you found it difficult  to postpone urination? About half the time     How often have you had a weak urinary stream? About half the time     How often have you had to strain to start urination? Less than half the time     How many times did you typically get up at night to urinate? 2 Times     Total IPSS Score 15       Quality of Life due to urinary symptoms   If you were to spend the rest of your life with your urinary condition just the way it is now how would you feel about that? Mostly Satisfied           Score:  1-7 Mild 8-19 Moderate 20-35 Severe  Erectile dysfunction Lack of libido  PMH: Past Medical History:  Diagnosis Date  . Arthritis    right hand  . BPH (benign prostatic hyperplasia)   . ED (erectile dysfunction)   . Elevated PSA   . GERD (gastroesophageal reflux disease)    occasional  . Glaucoma   . Gout   . Gout   . History  of alcoholism ([place])   . History of chicken pox   . History of measles   . History of mumps   . History of pernicious anemia   . HTN (hypertension)    controlled  . Hypothyroidism   . Wears partial dentures    upper and lower     Surgical History: Past Surgical History:  Procedure Laterality Date  . EYE SURGERY Right 10/24/2019   laser eye surgery on right eye for glaucoma  . PARATHYROIDECTOMY  2004  . PHOTOCOAGULATION WITH LASER Right 10/07/2015   Procedure: PHOTOCOAGULATION WITH LASER RIGHT EYE TRANS SCLERAL;  Surgeon: Ronnell Freshwater, MD;  Location: [facility];  Service: Ophthalmology;  Laterality: Right;  . TONSILLECTOMY      Home Medications:  Allergies as of 01/25/2020      Reactions   Krystexxa  [pegloticase] Anaphylaxis   Allopurinol    Abdominal pain and itching   Ciprofloxacin    causes mouth to itch   Levofloxacin Other (See Comments)   Tendon pain   Mycophenolate Mofetil    Niacin    Other reaction(s): UNKNOWN   Sulfa Antibiotics Itching   Uloric  [febuxostat]    Other reaction(s): Abdominal pain    Finasteride Itching      Medication List       Accurate as of January 25, 2020  3:46 PM. If you have any questions, ask your nurse or doctor.        allopurinol 100 MG tablet Commonly known as: ZYLOPRIM TAKE 1 TABLET BY MOUTH TWICE A DAY   brimonidine 0.2 % ophthalmic solution Commonly known as: ALPHAGAN Place 1 drop into both eyes 2 (two) times daily.   colchicine 0.6 MG tablet TAKE 1 TABLET BY MOUTH EVERY DAY AS NEEDED FOR GOUT   cyanocobalamin 1000 MCG/ML injection Commonly known as: (VITAMIN B-12) INJECT 1 ML (1,000 MCG TOTAL) INTO THE MUSCLE EVERY 30 (THIRTY) DAYS.   dorzolamide-timolol 22.3-6.8 MG/ML ophthalmic solution Commonly known as: COSOPT Place 1 drop into both eyes 2 (two) times daily.   doxazosin 1 MG tablet Commonly known as: CARDURA Take 1 mg by mouth daily.   doxazosin 8 MG tablet Commonly known as: CARDURA Take 0.5 tablets (4 mg total) by mouth daily.   finasteride 5 MG tablet Commonly known as: Proscar Take 1 tablet (5 mg total) by mouth daily.   hydrOXYzine 25 MG tablet Commonly known as: ATARAX/VISTARIL Take 1 tablet (25 mg total) by mouth 2 (two) times daily as needed for itching.   irbesartan 150 MG tablet Commonly known as: AVAPRO TAKE 1 TABLET BY MOUTH EVERY DAY   latanoprost 0.005 % ophthalmic solution Commonly known as: XALATAN Place 1 drop into both eyes at bedtime.   levothyroxine 150 MCG tablet Commonly known as: SYNTHROID TAKE 1 TABLET (150 MCG TOTAL) BY MOUTH DAILY AT 6 (SIX) AM.   multivitamin tablet Take 1 tablet by mouth daily.   Needles & Syringes Misc Use one each month for vitamin b12 injection   nystatin powder Commonly known as: MYCOSTATIN/NYSTOP Apply 1 application topically 3 (three) times daily.   predniSONE 10 MG tablet Commonly known as: DELTASONE Take 1/2 to 1 tablet twice daily as needed for gout flares   probenecid 500 MG tablet Commonly known as: BENEMID TAKE 0.5 TABLETS (250 MG TOTAL) BY MOUTH  TWICE DAILY   sildenafil 20 MG tablet Commonly known as: REVATIO Take 3 to 5 tablets two hours before intercouse on an empty stomach.  Do not  take with nitrates.   vitamin C 100 MG tablet Take by mouth daily. Dose unsure   Vitamin D3 10 MCG (400 UNIT) tablet Take by mouth daily. Unsure dose       Allergies:  Allergies  Allergen Reactions  . Krystexxa  [Pegloticase] Anaphylaxis  . Allopurinol     Abdominal pain and itching  . Ciprofloxacin     causes mouth to itch  . Levofloxacin Other (See Comments)    Tendon pain  . Mycophenolate Mofetil   . Niacin     Other reaction(s): UNKNOWN  . Sulfa Antibiotics Itching  . Uloric  [Febuxostat]     Other reaction(s): Abdominal pain  . Finasteride Itching    Family History: Family History  Problem Relation Age of Onset  . Asthma Mother   . Heart attack Father   . Parkinson's disease Father   . Kidney disease Neg Hx   . Prostate cancer Neg Hx        maybe paternal grandfather ? unsure    Social History:  reports that he quit smoking about 41 years ago. His smoking use included cigarettes. He has a 30.00 pack-year smoking history. He has never used smokeless tobacco. He reports that he does not drink alcohol and does not use drugs.  ROS: For pertinent review of systems please refer to history of present illness  Physical Exam: BP (!) 152/85   Pulse 72   Ht 5\' 11"  (1.803 m)   Wt 185 lb (83.9 kg)   BMI 25.80 kg/m   Constitutional:  Well nourished. Alert and oriented, No acute distress. HEENT: [place] AT, mask in place.  Trachea midline Cardiovascular: No clubbing, cyanosis, or edema. Respiratory: Normal respiratory effort, no increased work of breathing. GU: No CVA tenderness.  No bladder fullness or masses.  Patient with uncircumcised phallus. Foreskin easily retracted  Urethral meatus is patent.  No penile discharge. No penile lesions or rashes. Scrotum without lesions, cysts, rashes and/or edema.  Testicles are located  scrotally bilaterally. No masses are appreciated in the testicles. Left and right epididymis are normal. Rectal: Patient with  normal sphincter tone. Anus and perineum without scarring or rashes. No rectal masses are appreciated. Prostate is approximately 60 grams, *could only palpate the apex and midportion of the gland, no nodules are appreciated. Seminal vesicles could not be palpated Skin: No rashes, bruises or suspicious lesions. Lymph: No inguinal adenopathy. Neurologic: Grossly intact, no focal deficits, moving all 4 extremities. Psychiatric: Normal mood and affect.  Laboratory Data:   Lab Results  Component Value Date   WBC 6.6 08/16/2018   HGB 13.4 08/16/2018   HCT 39.4 08/16/2018   MCV 87 08/16/2018   PLT 211 08/16/2018   Lab Results  Component Value Date   CREATININE 1.40 (H) 08/22/2019   Lab Results  Component Value Date   TSH 0.159 (L) 08/22/2019      Component Value Date/Time   CHOL 152 08/22/2019 1418   HDL 38 (L) 08/22/2019 1418   CHOLHDL 4.0 08/22/2019 1418   LDLCALC 87 08/22/2019 1418   Lab Results  Component Value Date   AST 16 08/22/2019   Lab Results  Component Value Date   ALT 10 08/22/2019   PSA Trend  See HPI I have reviewed the labs.  Assessment & Plan:    1. Elevated PSA - Current PSA 3.4 (6.8) - neg prostate MRI 12/2018 - PSAD 0.138 - Patient would like to continue close monitoring of his PSA - RTC in  6 months for PSA  2. BPH with LUTS - IPSS score is 15/2, it is improved - continue tamsulosin 0.4 mg daily and stop finasteride 5 mg daily x 2 months - RTC in 6 months for I PSS, PSA and PVR   3. Erectile dysfunction:  - hold finasteride x 2 months to improve libido - Continue sildenafil as needed - RTC in 6 months for SHIM score and exam  Return in about 6 months (around 07/27/2020) for PSA , IPSS, PVR and exam .  Zara Council, [facility]  [address] [place], [place]  [id number] [phone]

## 2020-01-25 NOTE — Addendum Note (Signed)
Addended by: Alvera Novel on: 01/25/2020 04:01 PM   Modules accepted: Orders

## 2020-01-26 ENCOUNTER — Ambulatory Visit: Payer: Self-pay | Admitting: Urology

## 2020-02-02 DIAGNOSIS — H401113 Primary open-angle glaucoma, right eye, severe stage: Secondary | ICD-10-CM | POA: Diagnosis not present

## 2020-02-02 DIAGNOSIS — H2513 Age-related nuclear cataract, bilateral: Secondary | ICD-10-CM | POA: Insufficient documentation

## 2020-02-09 DIAGNOSIS — H401122 Primary open-angle glaucoma, left eye, moderate stage: Secondary | ICD-10-CM | POA: Diagnosis not present

## 2020-02-23 ENCOUNTER — Ambulatory Visit: Payer: PPO | Admitting: Family Medicine

## 2020-02-27 ENCOUNTER — Ambulatory Visit (INDEPENDENT_AMBULATORY_CARE_PROVIDER_SITE_OTHER): Payer: PPO | Admitting: Family Medicine

## 2020-02-27 ENCOUNTER — Other Ambulatory Visit: Payer: Self-pay

## 2020-02-27 DIAGNOSIS — Z23 Encounter for immunization: Secondary | ICD-10-CM

## 2020-03-06 ENCOUNTER — Ambulatory Visit: Payer: PPO | Admitting: Family Medicine

## 2020-03-08 ENCOUNTER — Ambulatory Visit (INDEPENDENT_AMBULATORY_CARE_PROVIDER_SITE_OTHER): Payer: PPO | Admitting: Family Medicine

## 2020-03-08 ENCOUNTER — Encounter: Payer: Self-pay | Admitting: Family Medicine

## 2020-03-08 ENCOUNTER — Other Ambulatory Visit: Payer: Self-pay

## 2020-03-08 VITALS — BP 118/65 | HR 57 | Temp 97.9°F | Resp 16 | Wt 182.0 lb

## 2020-03-08 DIAGNOSIS — E039 Hypothyroidism, unspecified: Secondary | ICD-10-CM

## 2020-03-08 DIAGNOSIS — N1831 Chronic kidney disease, stage 3a: Secondary | ICD-10-CM

## 2020-03-08 DIAGNOSIS — E790 Hyperuricemia without signs of inflammatory arthritis and tophaceous disease: Secondary | ICD-10-CM | POA: Insufficient documentation

## 2020-03-08 DIAGNOSIS — I10 Essential (primary) hypertension: Secondary | ICD-10-CM | POA: Diagnosis not present

## 2020-03-08 NOTE — Progress Notes (Signed)
Established patient visit   Patient: Jacob WINSKI Sr.   DOB: February 01, 1934   86 y.o. Male  MRN: [id number] Visit Date: 03/08/2020  Today's healthcare provider: Lelon Huh, MD   Chief Complaint  Patient presents with  . Hypertension  . Gout   Subjective    HPI  Hypertension, follow-up  BP Readings from Last 3 Encounters:  03/08/20 118/65  01/25/20 (!) 152/85  08/22/19 124/68   Wt Readings from Last 3 Encounters:  03/08/20 182 lb (82.6 kg)  01/25/20 185 lb (83.9 kg)  08/22/19 192 lb (87.1 kg)     He was last seen for hypertension 6 months ago.  BP at that visit was 124/68. Management since that visit includes checking labs which showed that kidney functions had improved, but still at about 65% of normal. Patient advised to be sure to drink plenty of water.    He states he occasionally take an extra 1mg  tablet. occaionally He reports good compliance with treatment. He is not having side effects.  He is following a Regular diet. He is exercising. He does not smoke.  Use of agents associated with hypertension: none.   Outside blood pressures are 130/75. Symptoms: No chest pain No chest pressure  No palpitations No syncope  No dyspnea No orthopnea  No paroxysmal nocturnal dyspnea No lower extremity edema   Pertinent labs: Lab Results  Component Value Date   CHOL 152 08/22/2019   HDL 38 (L) 08/22/2019   LDLCALC 87 08/22/2019   TRIG 154 (H) 08/22/2019   CHOLHDL 4.0 08/22/2019   Lab Results  Component Value Date   NA 142 08/22/2019   K 4.6 08/22/2019   CREATININE 1.40 (H) 08/22/2019   GFRNONAA 46 (L) 08/22/2019   GFRAA 53 (L) 08/22/2019   GLUCOSE 94 08/22/2019     The ASCVD Risk score (Goff DC Jr., et al., 2013) failed to calculate for the following reasons:   The 2013 ASCVD risk score is only valid for ages 63 to 77   ---------------------------------------------------------------------------------------------------  Follow up for Gout:  The  patient was last seen for this 6 months ago. During that visit labs were checked which showed high uric acid levels. Patient advised to double up on allopurinol to 2 x 100mg  tablets daily to reduce gout flares. He had cut down to 100 daily due to itching which didn't make any difference so he just went back up to 200mg  a few days ago.   He reports good compliance with treatment. He feels that condition is Unchanged. He is not having side effects.   -----------------------------------------------------------------------------------------     Medications: Outpatient Medications Prior to Visit  Medication Sig  . allopurinol (ZYLOPRIM) 100 MG tablet TAKE 1 TABLET BY MOUTH TWICE A DAY  . Ascorbic Acid (VITAMIN C) 100 MG tablet Take by mouth daily. Dose unsure  . brimonidine (ALPHAGAN) 0.2 % ophthalmic solution Place 1 drop into both eyes 2 (two) times daily.  . Cholecalciferol (VITAMIN D3) 10 MCG (400 UNIT) tablet Take by mouth daily. Unsure dose  . colchicine 0.6 MG tablet TAKE 1 TABLET BY MOUTH EVERY DAY AS NEEDED FOR GOUT  . cyanocobalamin (,VITAMIN B-12,) 1000 MCG/ML injection INJECT 1 ML (1,000 MCG TOTAL) INTO THE MUSCLE EVERY 30 (THIRTY) DAYS.  Marland Kitchen dorzolamide-timolol (COSOPT) 22.3-6.8 MG/ML ophthalmic solution Place 1 drop into both eyes 2 (two) times daily.   Marland Kitchen doxazosin (CARDURA) 1 MG tablet Take 1 mg by mouth daily.  Marland Kitchen doxazosin (CARDURA) 8 MG tablet  Take 0.5 tablets (4 mg total) by mouth daily.  . finasteride (PROSCAR) 5 MG tablet Take 1 tablet (5 mg total) by mouth daily.  . hydrOXYzine (ATARAX/VISTARIL) 25 MG tablet Take 1 tablet (25 mg total) by mouth 2 (two) times daily as needed for itching.  . irbesartan (AVAPRO) 150 MG tablet TAKE 1 TABLET BY MOUTH EVERY DAY  . latanoprost (XALATAN) 0.005 % ophthalmic solution Place 1 drop into both eyes at bedtime.   Marland Kitchen levothyroxine (SYNTHROID) 150 MCG tablet TAKE 1 TABLET (150 MCG TOTAL) BY MOUTH DAILY AT 6 (SIX) AM.  . Multiple Vitamin  (MULTIVITAMIN) tablet Take 1 tablet by mouth daily.  . Needles & Syringes MISC Use one each month for vitamin b12 injection  . nystatin (MYCOSTATIN/NYSTOP) powder Apply 1 application topically 3 (three) times daily.  . predniSONE (DELTASONE) 10 MG tablet Take 1/2 to 1 tablet twice daily as needed for gout flares  . probenecid (BENEMID) 500 MG tablet TAKE 0.5 TABLETS (250 MG TOTAL) BY MOUTH TWICE DAILY  . sildenafil (REVATIO) 20 MG tablet Take 3 to 5 tablets two hours before intercouse on an empty stomach.  Do not take with nitrates.   No facility-administered medications prior to visit.    Review of Systems  Constitutional: Negative for appetite change, chills and fever.  Respiratory: Negative for chest tightness, shortness of breath and wheezing.   Cardiovascular: Negative for chest pain and palpitations.  Gastrointestinal: Negative for abdominal pain, nausea and vomiting.  Musculoskeletal: Positive for arthralgias (occasionally in hands).     Objective    BP 118/65 (BP Location: Left Arm, Patient Position: Sitting, Cuff Size: Normal)   Pulse (!) 57   Temp 97.9 F (36.6 C) (Oral)   Resp 16   Wt 182 lb (82.6 kg)   BMI 25.38 kg/m   Physical Exam  General appearance:  Overweight male, cooperative and in no acute distress Head: Normocephalic, without obvious abnormality, atraumatic Respiratory: Respirations even and unlabored, normal respiratory rate Extremities: All extremities are intact.  Skin: Skin color, texture, turgor normal. No rashes seen  Psych: Appropriate mood and affect. Neurologic: Mental status: Alert, oriented to person, place, and time, thought content appropriate.   No results found for any visits on 03/08/20.  Assessment & Plan     1. Hyperuricemia Doing well with allopurinol, although he just started back up to 200mg  daily dosage a few days ago. Gout is well controlled so long as he is careful with his diet.  - Uric acid  2. Essential (primary)  hypertension Continue current medications.  Advised we can change 1/2 of 8mg  tablet of doxazosin to 4mg  tablets with next refill, and he doesn't need to take the extra 1mg  tablet unless he blood pressure is unusually high.    3. Stage 3a chronic kidney disease (HCC)  - Comprehensive metabolic panel - Parathyroid hormone, intact (no Ca)  4. Hypothyroidism, unspecified type Check TSH.    Future Appointments  Date Time Provider [place]  03/13/2020  3:10 PM [facility]  07/30/2020  9:30 AM [facility] None  08/01/2020 10:30 AM McGowan, Hunt Oris, PA-C [facility] None  09/06/2020  8:40 AM Caryn Section, Kirstie Peri, MD [facility]         The entirety of the information documented in the History of Present Illness, Review of Systems and Physical Exam were personally obtained by me. Portions of this information were initially documented by the CMA and reviewed by me for thoroughness and accuracy.  Lelon Huh, MD  [facility] [phone] (phone) [phone] (fax)  [place]

## 2020-03-09 LAB — COMPREHENSIVE METABOLIC PANEL
ALT: 13 IU/L (ref 0–44)
AST: 15 IU/L (ref 0–40)
Albumin/Globulin Ratio: 2 (ref 1.2–2.2)
Albumin: 4.3 g/dL (ref 3.6–4.6)
Alkaline Phosphatase: 81 IU/L (ref 44–121)
BUN/Creatinine Ratio: 17 (ref 10–24)
BUN: 26 mg/dL (ref 8–27)
Bilirubin Total: 0.6 mg/dL (ref 0.0–1.2)
CO2: 24 mmol/L (ref 20–29)
Calcium: 9.2 mg/dL (ref 8.6–10.2)
Chloride: 102 mmol/L (ref 96–106)
Creatinine, Ser: 1.5 mg/dL — ABNORMAL HIGH (ref 0.76–1.27)
GFR calc Af Amer: 49 mL/min/{1.73_m2} — ABNORMAL LOW (ref >59)
GFR calc non Af Amer: 42 mL/min/{1.73_m2} — ABNORMAL LOW (ref >59)
Globulin, Total: 2.2 g/dL (ref 1.5–4.5)
Glucose: 91 mg/dL (ref 65–99)
Potassium: 4.5 mmol/L (ref 3.5–5.2)
Sodium: 139 mmol/L (ref 134–144)
Total Protein: 6.5 g/dL (ref 6.0–8.5)

## 2020-03-09 LAB — TSH: TSH: 0.091 u[IU]/mL — ABNORMAL LOW (ref 0.450–4.500)

## 2020-03-09 LAB — URIC ACID: Uric Acid: 7.3 mg/dL (ref 3.8–8.4)

## 2020-03-09 LAB — PARATHYROID HORMONE, INTACT (NO CA): PTH: 22 pg/mL (ref 15–65)

## 2020-03-11 ENCOUNTER — Encounter: Payer: Self-pay | Admitting: Family Medicine

## 2020-03-13 ENCOUNTER — Other Ambulatory Visit: Payer: Self-pay

## 2020-03-13 ENCOUNTER — Ambulatory Visit (INDEPENDENT_AMBULATORY_CARE_PROVIDER_SITE_OTHER): Payer: PPO

## 2020-03-13 DIAGNOSIS — Z23 Encounter for immunization: Secondary | ICD-10-CM | POA: Diagnosis not present

## 2020-03-21 DIAGNOSIS — L299 Pruritus, unspecified: Secondary | ICD-10-CM | POA: Diagnosis not present

## 2020-03-21 DIAGNOSIS — L578 Other skin changes due to chronic exposure to nonionizing radiation: Secondary | ICD-10-CM | POA: Diagnosis not present

## 2020-03-21 DIAGNOSIS — L570 Actinic keratosis: Secondary | ICD-10-CM | POA: Diagnosis not present

## 2020-03-21 DIAGNOSIS — L821 Other seborrheic keratosis: Secondary | ICD-10-CM | POA: Diagnosis not present

## 2020-03-25 DIAGNOSIS — H401122 Primary open-angle glaucoma, left eye, moderate stage: Secondary | ICD-10-CM | POA: Diagnosis not present

## 2020-04-12 ENCOUNTER — Other Ambulatory Visit: Payer: Self-pay | Admitting: Family Medicine

## 2020-04-12 DIAGNOSIS — H401133 Primary open-angle glaucoma, bilateral, severe stage: Secondary | ICD-10-CM | POA: Diagnosis not present

## 2020-04-16 IMAGING — MR MR PROSTATE WO/W CM
56 series · 56 of 56 positions shown · IV contrast (gadavist)
Comparison: None.

CLINICAL DATA: Elevated PSA.

EXAM:
MR PROSTATE WITHOUT AND WITH CONTRAST
TECHNIQUE: Multiplanar multisequence MRI images were obtained of the pelvis
centered about the prostate. Pre and post contrast images were
obtained.
CONTRAST:  8 mL Gadavist

[Series 3: T1 · axial · 8.0mm · 0.74mm/px · 1 of 29 slices shown (1 of 48)]
[im 1/29]
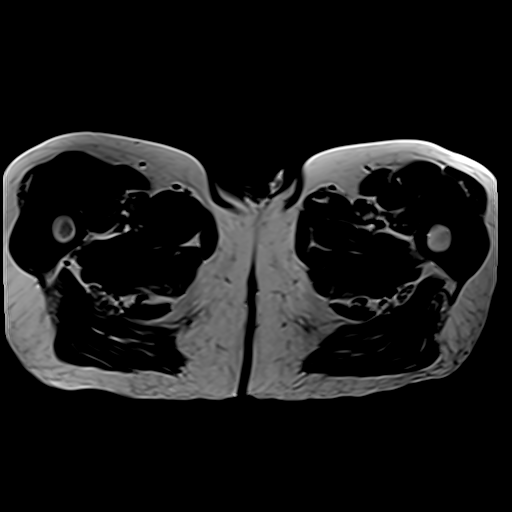

[Series 4: bSSFP fat-sat · axial · 8.0mm · 0.74mm/px · 1 of 29 slices shown]
[im 1/29]
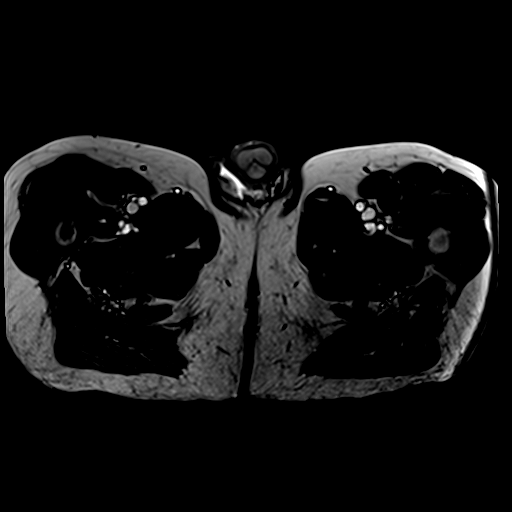

[Series 5: T2 · coronal · 3.0mm · 0.70mm/px · 1 of 30 slices shown (1 of 3)]
[im 1/30]
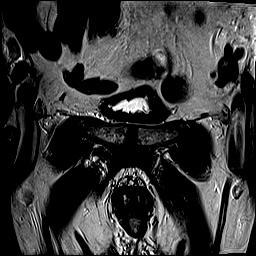

[Series 6: T2 · sagittal · 3.5mm · 0.62mm/px · 1 of 37 slices shown (2 of 3)]
[im 1/37]
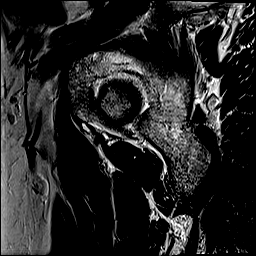

[Series 7: T1 · axial · 3.0mm · 0.35mm/px · 1 of 33 slices shown (2 of 48)]
[im 1/33]
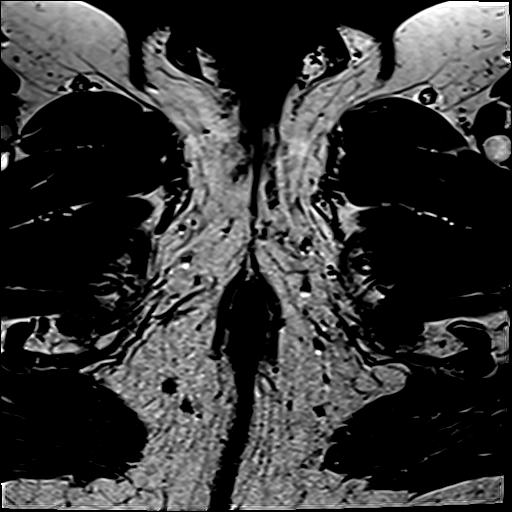

[Series 8: T2 · axial · 3.5mm · 0.56mm/px · 1 of 27 slices shown (3 of 3)]
[im 1/27]
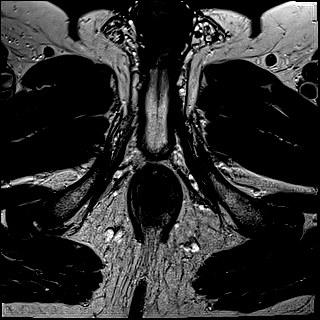

[Series 9: ax dwi_tracew · axial · 3.0mm · 0.78mm/px · 1 of 87 slices shown]
[im 1/87]
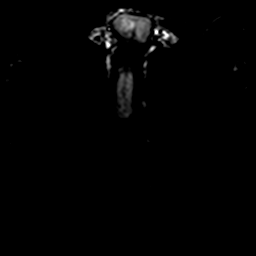

[Series 10: ax dwi_adc · axial · 3.0mm · 0.78mm/px · 1 of 29 slices shown]
[im 1/29]
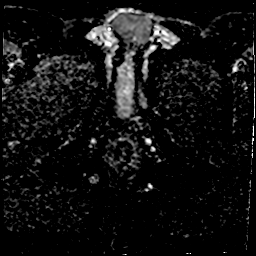

[Series 11: ax dwi_calc_bval · axial · 3.0mm · 0.78mm/px · 1 of 29 slices shown]
[im 1/29]
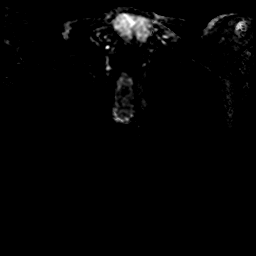

[Series 12: [id number]_288 · axial · 1.0mm · 1.04mm/px · 1 of 96 slices shown]
[im 1/96]
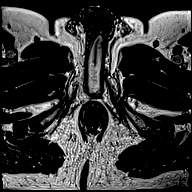

[Series 13: T1 · axial · 3.0mm · 1.15mm/px · 1 of 30 slices shown (3 of 48)]
[im 1/30]
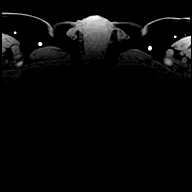

[Series 14: T1 · axial · 3.0mm · 1.15mm/px · 1 of 30 slices shown (4 of 48)]
[im 1/30]
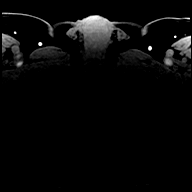

[Series 15: T1 · axial · 3.0mm · 1.15mm/px · 1 of 30 slices shown (5 of 48)]
[im 1/30]
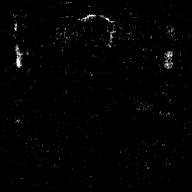

[Series 16: T1 · axial · 3.0mm · 1.15mm/px · 1 of 30 slices shown (6 of 48)]
[im 1/30]
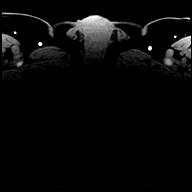

[Series 17: T1 · axial · 3.0mm · 1.15mm/px · 1 of 30 slices shown (7 of 48)]
[im 1/30]
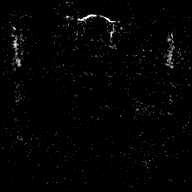

[Series 18: T1 · axial · 3.0mm · 1.15mm/px · 1 of 30 slices shown (8 of 48)]
[im 1/30]
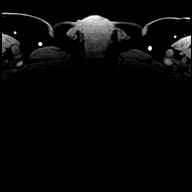

[Series 19: T1 · axial · 3.0mm · 1.15mm/px · 1 of 29 slices shown (9 of 48)]
[im 1/29]
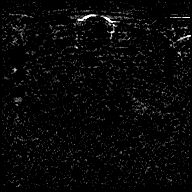

[Series 20: T1 · axial · 3.0mm · 1.15mm/px · 1 of 30 slices shown (10 of 48)]
[im 1/30]
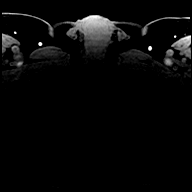

[Series 21: T1 · axial · 3.0mm · 1.15mm/px · 1 of 28 slices shown (11 of 48)]
[im 1/28]
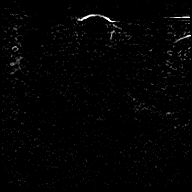

[Series 22: T1 · axial · 3.0mm · 1.15mm/px · 1 of 30 slices shown (12 of 48)]
[im 1/30]
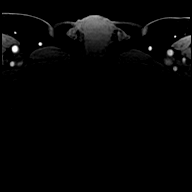

[Series 23: T1 · axial · 3.0mm · 1.15mm/px · 1 of 30 slices shown (13 of 48)]
[im 1/30]
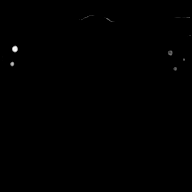

[Series 24: T1 · axial · 3.0mm · 1.15mm/px · 1 of 30 slices shown (14 of 48)]
[im 1/30]
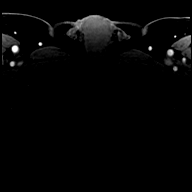

[Series 25: T1 · axial · 3.0mm · 1.15mm/px · 1 of 30 slices shown (15 of 48)]
[im 1/30]
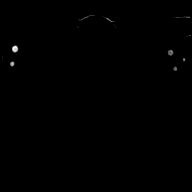

[Series 26: T1 · axial · 3.0mm · 1.15mm/px · 1 of 30 slices shown (16 of 48)]
[im 1/30]
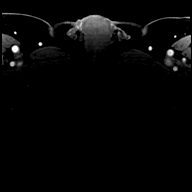

[Series 27: T1 · axial · 3.0mm · 1.15mm/px · 1 of 30 slices shown (17 of 48)]
[im 1/30]
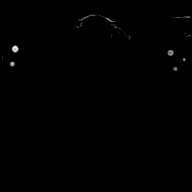

[Series 28: T1 · axial · 3.0mm · 1.15mm/px · 1 of 30 slices shown (18 of 48)]
[im 1/30]
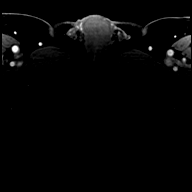

[Series 29: T1 · axial · 3.0mm · 1.15mm/px · 1 of 30 slices shown (19 of 48)]
[im 1/30]
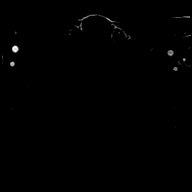

[Series 30: T1 · axial · 3.0mm · 1.15mm/px · 1 of 30 slices shown (20 of 48)]
[im 1/30]
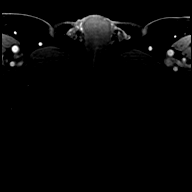

[Series 31: T1 · axial · 3.0mm · 1.15mm/px · 1 of 30 slices shown (21 of 48)]
[im 1/30]
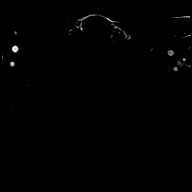

[Series 32: T1 · axial · 3.0mm · 1.15mm/px · 1 of 30 slices shown (22 of 48)]
[im 1/30]
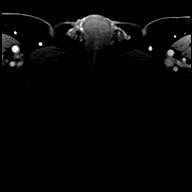

[Series 33: T1 · axial · 3.0mm · 1.15mm/px · 1 of 30 slices shown (23 of 48)]
[im 1/30]
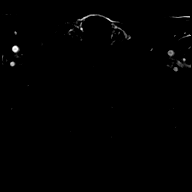

[Series 34: T1 · axial · 3.0mm · 1.15mm/px · 1 of 30 slices shown (24 of 48)]
[im 1/30]
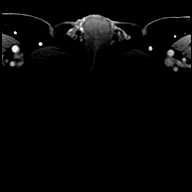

[Series 35: T1 · axial · 3.0mm · 1.15mm/px · 1 of 30 slices shown (25 of 48)]
[im 1/30]
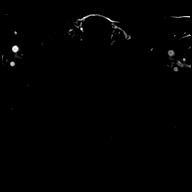

[Series 36: T1 · axial · 3.0mm · 1.15mm/px · 1 of 30 slices shown (26 of 48)]
[im 1/30]
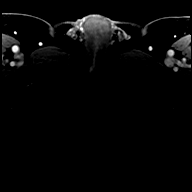

[Series 37: T1 · axial · 3.0mm · 1.15mm/px · 1 of 30 slices shown (27 of 48)]
[im 1/30]
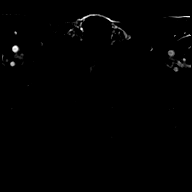

[Series 38: T1 · axial · 3.0mm · 1.15mm/px · 1 of 30 slices shown (28 of 48)]
[im 1/30]
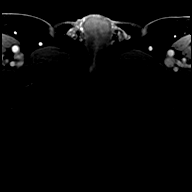

[Series 39: T1 · axial · 3.0mm · 1.15mm/px · 1 of 30 slices shown (29 of 48)]
[im 1/30]
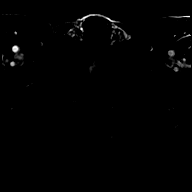

[Series 40: T1 · axial · 3.0mm · 1.15mm/px · 1 of 30 slices shown (30 of 48)]
[im 1/30]
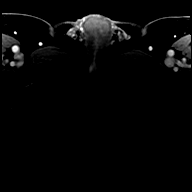

[Series 41: T1 · axial · 3.0mm · 1.15mm/px · 1 of 30 slices shown (31 of 48)]
[im 1/30]
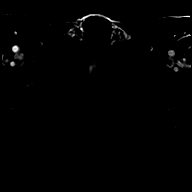

[Series 42: T1 · axial · 3.0mm · 1.15mm/px · 1 of 30 slices shown (32 of 48)]
[im 1/30]
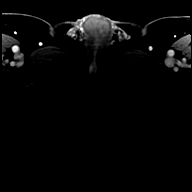

[Series 43: T1 · axial · 3.0mm · 1.15mm/px · 1 of 30 slices shown (33 of 48)]
[im 1/30]
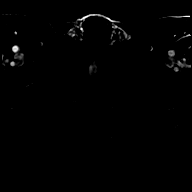

[Series 44: T1 · axial · 3.0mm · 1.15mm/px · 1 of 30 slices shown (34 of 48)]
[im 1/30]
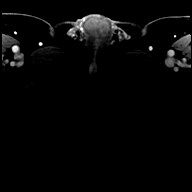

[Series 45: T1 · axial · 3.0mm · 1.15mm/px · 1 of 30 slices shown (35 of 48)]
[im 1/30]
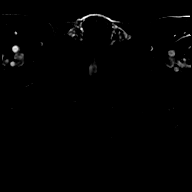

[Series 46: T1 · axial · 3.0mm · 1.15mm/px · 1 of 30 slices shown (36 of 48)]
[im 1/30]
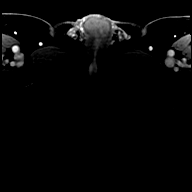

[Series 47: T1 · axial · 3.0mm · 1.15mm/px · 1 of 30 slices shown (37 of 48)]
[im 1/30]
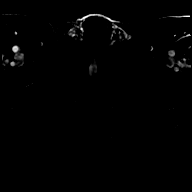

[Series 48: T1 · axial · 3.0mm · 1.15mm/px · 1 of 30 slices shown (38 of 48)]
[im 1/30]
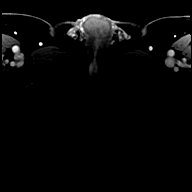

[Series 49: T1 · axial · 3.0mm · 1.15mm/px · 1 of 30 slices shown (39 of 48)]
[im 1/30]
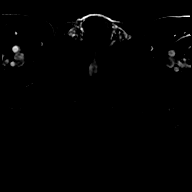

[Series 50: T1 · axial · 3.0mm · 1.15mm/px · 1 of 30 slices shown (40 of 48)]
[im 1/30]
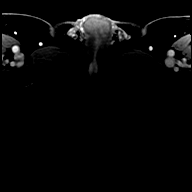

[Series 51: T1 · axial · 3.0mm · 1.15mm/px · 1 of 30 slices shown (41 of 48)]
[im 1/30]
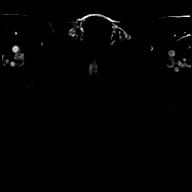

[Series 52: T1 · axial · 3.0mm · 1.15mm/px · 1 of 30 slices shown (42 of 48)]
[im 1/30]
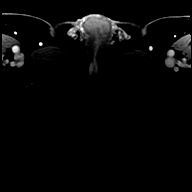

[Series 53: T1 · axial · 3.0mm · 1.15mm/px · 1 of 30 slices shown (43 of 48)]
[im 1/30]
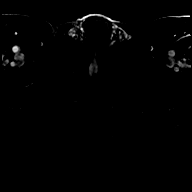

[Series 54: T1 · axial · 3.0mm · 1.15mm/px · 1 of 30 slices shown (44 of 48)]
[im 1/30]
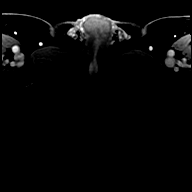

[Series 55: T1 · axial · 3.0mm · 1.15mm/px · 1 of 30 slices shown (45 of 48)]
[im 1/30]
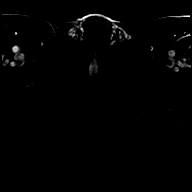

[Series 56: T1 · axial · 3.0mm · 1.15mm/px · 1 of 30 slices shown (46 of 48)]
[im 1/30]
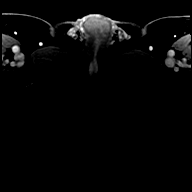

[Series 57: T1 · axial · 3.0mm · 1.15mm/px · 1 of 30 slices shown (47 of 48)]
[im 1/30]
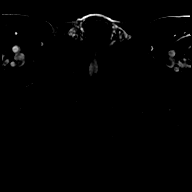

[Series 58: T1 · axial · 3.0mm · 1.15mm/px · 1 of 30 slices shown (48 of 48)]
[im 1/30]
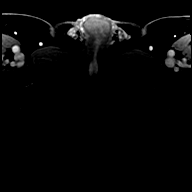

[56 of 56 positions shown; findings below may reference images not displayed]

FINDINGS: Prostate:

-- Peripheral Zone: Linear/wedge shaped hypointensity is seen on
ADC; however, no focal ADC hypointense or high b-value DWI
hyperintense nodules are identified.

-- Transition/Central Zone: Circumscribed BPH nodules noted, but no
suspicious nodules with obscured or non-circumscribed margins seen.

-- Measurements/Volume:  4.9 x 4.0 x 5.6 cm

Transcapsular spread:  Absent

Seminal vesicle involvement:  Absent

Neurovascular bundle involvement:  Absent

Pelvic adenopathy: None visualized

Bone metastasis: None visualized

Other: Mild diffuse bladder wall thickening, consistent with chronic
bladder outlet obstruction. Severe sigmoid diverticulosis, without
evidence of diverticulitis. Small left inguinal hernia containing
only fat.
IMPRESSION: No radiographic evidence of high-grade prostate carcinoma. PI-RADS
2: Low (clinically significant cancer is unlikely to be present)

## 2020-05-01 DIAGNOSIS — U071 COVID-19: Secondary | ICD-10-CM

## 2020-05-01 HISTORY — DX: COVID-19: U07.1

## 2020-05-27 NOTE — Progress Notes (Signed)
Established patient visit   Patient: Jacob GALINDO Sr.   DOB: 04-06-1934   86 y.o. Male  MRN: [id number] Visit Date: 05/28/2020  Today's healthcare provider: Mila Merry, MD   Chief Complaint  Patient presents with  . Bradycardia   Subjective    HPI  Patient is here today for an evaluation of a possible abnormal heart rhythm. Insurance nurse came to his home and found heart rate to be 56. He states his niece had a device on her phone that detected a slow heartbeat. Patient reports having some shortness of breath with exertion for the past several months.  No chest pains or palpitations.      Medications: Outpatient Medications Prior to Visit  Medication Sig  . allopurinol (ZYLOPRIM) 100 MG tablet TAKE 1 TABLET BY MOUTH TWICE A DAY  . Ascorbic Acid (VITAMIN C) 100 MG tablet Take by mouth daily. Dose unsure  . brimonidine (ALPHAGAN) 0.2 % ophthalmic solution Place 1 drop into both eyes 2 (two) times daily.  . Cholecalciferol (VITAMIN D3) 10 MCG (400 UNIT) tablet Take by mouth daily. Unsure dose  . colchicine 0.6 MG tablet TAKE 1 TABLET BY MOUTH EVERY DAY AS NEEDED FOR GOUT  . cyanocobalamin (,VITAMIN B-12,) 1000 MCG/ML injection INJECT 1 ML (1,000 MCG TOTAL) INTO THE MUSCLE EVERY 30 (THIRTY) DAYS.  Marland Kitchen dorzolamide-timolol (COSOPT) 22.3-6.8 MG/ML ophthalmic solution Place 1 drop into both eyes 2 (two) times daily.   Marland Kitchen doxazosin (CARDURA) 8 MG tablet Take 0.5 tablets (4 mg total) by mouth daily.  . finasteride (PROSCAR) 5 MG tablet Take 1 tablet (5 mg total) by mouth daily.  . hydrOXYzine (ATARAX/VISTARIL) 25 MG tablet Take 1 tablet (25 mg total) by mouth 2 (two) times daily as needed for itching.  . irbesartan (AVAPRO) 150 MG tablet TAKE 1 TABLET BY MOUTH EVERY DAY  . latanoprost (XALATAN) 0.005 % ophthalmic solution Place 1 drop into both eyes at bedtime.   Marland Kitchen levothyroxine (SYNTHROID) 150 MCG tablet TAKE 1 TABLET (150 MCG TOTAL) BY MOUTH DAILY AT 6 (SIX) AM.  . Multiple Vitamin  (MULTIVITAMIN) tablet Take 1 tablet by mouth daily.  . Needles & Syringes MISC Use one each month for vitamin b12 injection  . predniSONE (DELTASONE) 10 MG tablet Take 1/2 to 1 tablet twice daily as needed for gout flares  . sildenafil (REVATIO) 20 MG tablet Take 3 to 5 tablets two hours before intercouse on an empty stomach.  Do not take with nitrates.  . [DISCONTINUED] nystatin (MYCOSTATIN/NYSTOP) powder Apply 1 application topically 3 (three) times daily. (Patient not taking: Reported on 05/28/2020)   No facility-administered medications prior to visit.    Review of Systems  Constitutional: Negative for appetite change, chills and fever.  Respiratory: Positive for shortness of breath (with exertion). Negative for chest tightness and wheezing.   Cardiovascular: Negative for chest pain and palpitations.  Gastrointestinal: Negative for abdominal pain, nausea and vomiting.      Objective    BP 132/71 (BP Location: Left Arm, Patient Position: Sitting, Cuff Size: Normal)   Pulse 64   Temp 97.9 F (36.6 C) (Temporal)   Resp 16   Wt 187 lb (84.8 kg)   SpO2 99% Comment: room air  BMI 26.08 kg/m    Physical Exam   General: Appearance:     Overweight male in no acute distress  Eyes:    PERRL, conjunctiva/corneas clear, EOM's intact       Lungs:  Clear to auscultation bilaterally, respirations unlabored  Heart:    Normal heart rate. Normal rhythm. No murmurs, rubs, or gallops.   MS:   All extremities are intact.   Neurologic:   Awake, alert, oriented x 3. No apparent focal neurological           defect.         No results found for any visits on 05/28/20.  Assessment & Plan     1. Essential (primary) hypertension Well controlled.  Continue current medications.   - EKG 12-Lead  2. Bradycardia No sign of any other conduction or rhythm abnormalities. Normal HR today.          The entirety of the information documented in the History of Present Illness, Review of Systems  and Physical Exam were personally obtained by me. Portions of this information were initially documented by the CMA and reviewed by me for thoroughness and accuracy.      Mila Merry, MD  [facility] [phone] (phone) [phone] (fax)  [facility]

## 2020-05-28 ENCOUNTER — Ambulatory Visit (INDEPENDENT_AMBULATORY_CARE_PROVIDER_SITE_OTHER): Payer: PPO | Admitting: Family Medicine

## 2020-05-28 ENCOUNTER — Other Ambulatory Visit: Payer: Self-pay

## 2020-05-28 ENCOUNTER — Encounter: Payer: Self-pay | Admitting: Family Medicine

## 2020-05-28 VITALS — BP 132/71 | HR 64 | Temp 97.9°F | Resp 16 | Wt 187.0 lb

## 2020-05-28 DIAGNOSIS — I10 Essential (primary) hypertension: Secondary | ICD-10-CM | POA: Diagnosis not present

## 2020-05-28 DIAGNOSIS — R001 Bradycardia, unspecified: Secondary | ICD-10-CM | POA: Diagnosis not present

## 2020-06-14 DIAGNOSIS — H401133 Primary open-angle glaucoma, bilateral, severe stage: Secondary | ICD-10-CM | POA: Diagnosis not present

## 2020-07-08 ENCOUNTER — Other Ambulatory Visit: Payer: Self-pay | Admitting: Urology

## 2020-07-09 NOTE — Progress Notes (Signed)
Subjective:   Jacob Darlyn Read Sr. is a 87 y.o. male who presents for Medicare Annual/Subsequent preventive examination.  I connected with Jacob Nielsen today by telephone and verified that I am speaking with the correct person using two identifiers. Location patient: home Location provider: work Persons participating in the virtual visit: patient, provider.   I discussed the limitations, risks, security and privacy concerns of performing an evaluation and management service by telephone and the availability of in person appointments. I also discussed with the patient that there may be a patient responsible charge related to this service. The patient expressed understanding and verbally consented to this telephonic visit.    Interactive audio and video telecommunications were attempted between this provider and patient, however failed, due to patient having technical difficulties OR patient did not have access to video capability.  We continued and completed visit with audio only.   Review of Systems    N/A  Cardiac Risk Factors include: advanced age (>6men, >77 women);male gender;hypertension     Objective:    There were no vitals filed for this visit. There is no height or weight on file to calculate BMI.  Advanced Directives 07/10/2020 07/04/2019 06/29/2018 06/24/2017 05/27/2016 10/07/2015  Does Patient Have a Medical Advance Directive? Yes Yes Yes Yes Yes Yes  Type of Paramedic of [place];Living will [place];Living will [place];Living will Living will Living will;Healthcare Power of [place];Living will  Does patient want to make changes to medical advance directive? - - - - - No - Patient declined  Copy of [place] in Chart? No - copy requested No - copy requested No - copy requested - Yes No - copy requested    Current Medications (verified) Outpatient Encounter Medications  as of 07/10/2020  Medication Sig  . allopurinol (ZYLOPRIM) 100 MG tablet TAKE 1 TABLET BY MOUTH TWICE A DAY  . Ascorbic Acid (VITAMIN C) 100 MG tablet Take by mouth daily. Dose unsure  . brimonidine (ALPHAGAN) 0.2 % ophthalmic solution Place 1 drop into both eyes 2 (two) times daily.  . Cholecalciferol (VITAMIN D3) 10 MCG (400 UNIT) tablet Take by mouth daily. Unsure dose  . colchicine 0.6 MG tablet TAKE 1 TABLET BY MOUTH EVERY DAY AS NEEDED FOR GOUT  . cyanocobalamin (,VITAMIN B-12,) 1000 MCG/ML injection INJECT 1 ML (1,000 MCG TOTAL) INTO THE MUSCLE EVERY 30 (THIRTY) DAYS.  Marland Kitchen dorzolamide-timolol (COSOPT) 22.3-6.8 MG/ML ophthalmic solution Place 1 drop into both eyes 2 (two) times daily.   Marland Kitchen doxazosin (CARDURA) 8 MG tablet Take 0.5 tablets (4 mg total) by mouth daily.  . finasteride (PROSCAR) 5 MG tablet TAKE 1 TABLET BY MOUTH EVERY DAY  . hydrOXYzine (ATARAX/VISTARIL) 25 MG tablet Take 1 tablet (25 mg total) by mouth 2 (two) times daily as needed for itching.  . irbesartan (AVAPRO) 150 MG tablet TAKE 1 TABLET BY MOUTH EVERY DAY  . latanoprost (XALATAN) 0.005 % ophthalmic solution Place 1 drop into both eyes at bedtime.   Marland Kitchen levothyroxine (SYNTHROID) 150 MCG tablet TAKE 1 TABLET (150 MCG TOTAL) BY MOUTH DAILY AT 6 (SIX) AM.  . Multiple Vitamin (MULTIVITAMIN) tablet Take 1 tablet by mouth daily.  . Needles & Syringes MISC Use one each month for vitamin b12 injection  . predniSONE (DELTASONE) 10 MG tablet Take 1/2 to 1 tablet twice daily as needed for gout flares  . sildenafil (REVATIO) 20 MG tablet Take 3 to 5 tablets  two hours before intercouse on an empty stomach.  Do not take with nitrates.   No facility-administered encounter medications on file as of 07/10/2020.    Allergies (verified) Krystexxa  [pegloticase], Allopurinol, Ciprofloxacin, Levofloxacin, Mycophenolate mofetil, Niacin, Sulfa antibiotics, Uloric  [febuxostat], and Finasteride   History: Past Medical History:  Diagnosis Date   . Arthritis    right hand  . BPH (benign prostatic hyperplasia)   . ED (erectile dysfunction)   . Elevated PSA   . GERD (gastroesophageal reflux disease)    occasional  . Glaucoma   . Gout   . Gout   . History of alcoholism ([place])   . History of chicken pox   . History of measles   . History of mumps   . History of pernicious anemia   . HTN (hypertension)    controlled  . Hypothyroidism   . Wears partial dentures    upper and lower    Past Surgical History:  Procedure Laterality Date  . EYE SURGERY Right 10/24/2019   laser eye surgery on right eye for glaucoma  . PARATHYROIDECTOMY  2004  . PHOTOCOAGULATION WITH LASER Right 10/07/2015   Procedure: PHOTOCOAGULATION WITH LASER RIGHT EYE TRANS SCLERAL;  Surgeon: Ronnell Freshwater, MD;  Location: [facility];  Service: Ophthalmology;  Laterality: Right;  . TONSILLECTOMY     Family History  Problem Relation Age of Onset  . Asthma Mother   . Heart attack Father   . Parkinson's disease Father   . Kidney disease Neg Hx   . Prostate cancer Neg Hx        maybe paternal grandfather ? unsure   Social History   Socioeconomic History  . Marital status: Married    Spouse name: Jacob Nielsen  . Number of children: 2  . Years of education: Not on file  . Highest education level: Some college, no degree  Occupational History  . Occupation: Retired    Comment: Previously worked at a [employer]  . Smoking status: Former Smoker    Packs/day: 1.50    Years: 20.00    Pack years: 30.00    Types: Cigarettes    Quit date: 06/01/1978    Years since quitting: 42.1  . Smokeless tobacco: Never Used  Vaping Use  . Vaping Use: Never used  Substance and Sexual Activity  . Alcohol use: No    Alcohol/week: 0.0 standard drinks    Comment: former Alcoholic, meets with AA once a week. Has been alcohol free for 34 years  . Drug use: No  . Sexual activity: Not on file  Other Topics Concern  . Not on file  Social  History Narrative  . Not on file   Social Determinants of Health   Financial Resource Strain: Low Risk   . Difficulty of Paying Living Expenses: Not hard at all  Food Insecurity: No Food Insecurity  . Worried About Charity fundraiser in the Last Year: Never true  . Ran Out of Food in the Last Year: Never true  Transportation Needs: No Transportation Needs  . Lack of Transportation (Medical): No  . Lack of Transportation (Non-Medical): No  Physical Activity: Inactive  . Days of Exercise per Week: 0 days  . Minutes of Exercise per Session: 0 min  Stress: No Stress Concern Present  . Feeling of Stress : Not at all  Social Connections: Moderately Integrated  . Frequency of Communication with Friends and Family: More than three times a  week  . Frequency of Social Gatherings with Friends and Family: More than three times a week  . Attends Religious Services: More than 4 times per year  . Active Member of Clubs or Organizations: No  . Attends Archivist Meetings: Never  . Marital Status: Married    Tobacco Counseling Counseling given: Not Answered   Clinical Intake:  Pre-visit preparation completed: Yes  Pain : No/denies pain     Nutritional Risks: None Diabetes: No  How often do you need to have someone help you when you read instructions, pamphlets, or other written materials from your doctor or pharmacy?: 1 - Never  Diabetic? No  Interpreter Needed?: No  Information entered by :: [facility], LPN   Activities of Daily Living In your present state of health, do you have any difficulty performing the following activities: 07/10/2020  Hearing? N  Vision? Y  Comment Has scar tissue blocking right eye.  Difficulty concentrating or making decisions? N  Walking or climbing stairs? N  Dressing or bathing? N  Doing errands, shopping? N  Preparing Food and eating ? N  Using the Toilet? N  In the past six months, have you accidently leaked urine? N  Do you have  problems with loss of bowel control? N  Managing your Medications? N  Managing your Finances? N  Housekeeping or managing your Housekeeping? N  Some recent data might be hidden    Patient Care Team: Birdie Sons, MD as PCP - General (Family Medicine) Regino Schultze, MD as Referring Physician (Dermatology) Leandrew Koyanagi, MD as Referring Physician (Ophthalmology) Emmaline Kluver., MD as Consulting Physician (Rheumatology) Nori Riis, PA-C as Physician Assistant (Urology)  Indicate any recent Medical Services you may have received from other than [facility] providers in the past year (date may be approximate).     Assessment:   This is a routine wellness examination for Jacob Nielsen.  Hearing/Vision screen No exam data present  Dietary issues and exercise activities discussed: Current Exercise Habits: Home exercise routine, Type of exercise: strength training/weights, Time (Minutes): 15, Frequency (Times/Week): 3, Weekly Exercise (Minutes/Week): 45, Intensity: Mild  Goals    . DIET - INCREASE WATER INTAKE     Recommend increasing water intake to 6-8 glasses a day.     . Reduce caffeine intake     Recommend to cut back on caffinated beverages and increase water consumption.       Depression Screen PHQ 2/9 Scores 07/10/2020 07/04/2019 07/04/2019 06/29/2018 06/29/2018 06/24/2017 06/24/2017  PHQ - 2 Score 0 0 0 0 0 0 0  PHQ- 9 Score - - - 0 - 0 -    Fall Risk Fall Risk  07/10/2020 07/04/2019 06/29/2018 06/24/2017 05/27/2016  Falls in the past year? 0 0 0 Yes No  Number falls in past yr: 0 0 - 2 or more -  Comment - - - tripped x 2 -  Injury with Fall? 0 0 - No -  Follow up - - - Falls prevention discussed -    FALL RISK PREVENTION PERTAINING TO THE HOME:  Any stairs in or around the home? Yes  If so, are there any without handrails? No  Home free of loose throw rugs in walkways, pet beds, electrical cords, etc? Yes  Adequate lighting in your home to reduce risk of falls? Yes    ASSISTIVE DEVICES UTILIZED TO PREVENT FALLS:  Life alert? No  Use of a cane, walker or w/c? No  Grab bars in  the bathroom? Yes  Shower chair or bench in shower? Yes  Elevated toilet seat or a handicapped toilet? Yes   Cognitive Function: Normal cognitive status assessed by observation by this Nurse Health Advisor. No abnormalities found.       6CIT Screen 06/29/2018 05/27/2016  What Year? 0 points 0 points  What month? 0 points 0 points  What time? 0 points 0 points  Count back from 20 0 points 0 points  Months in reverse 0 points 2 points  Repeat phrase 0 points 2 points  Total Score 0 4    Immunizations Immunization History  Administered Date(s) Administered  . Fluad Quad(high Dose 65+) 02/21/2019, 02/27/2020  . Influenza, High Dose Seasonal PF 03/08/2015, 03/07/2016, 03/18/2017, 04/09/2018  . Influenza,inj,Quad PF,6+ Mos 03/28/2013, 02/14/2014  . Influenza-Unspecified 01/30/2014  . PFIZER(Purple Top)SARS-COV-2 Vaccination 06/21/2019, 07/15/2019, 03/13/2020  . Pneumococcal Conjugate-13 02/14/2014  . Pneumococcal Polysaccharide-23 06/01/2008  . Tdap 10/18/2012  . Zoster 09/11/2014    TDAP status: Up to date  Flu Vaccine status: Up to date  Pneumococcal vaccine status: Up to date  Covid-19 vaccine status: Completed vaccines  Qualifies for Shingles Vaccine? Yes   Zostavax completed Yes   Shingrix Completed?: No.    Education has been provided regarding the importance of this vaccine. Patient has been advised to call insurance company to determine out of pocket expense if they have not yet received this vaccine. Advised may also receive vaccine at local pharmacy or Health Dept. Verbalized acceptance and understanding.  Screening Tests Health Maintenance  Topic Date Due  . COVID-19 Vaccine (4 - Booster for Pfizer series) 09/11/2020  . TETANUS/TDAP  10/19/2022  . INFLUENZA VACCINE  Completed  . PNA vac Low Risk Adult  Completed    Health Maintenance  There  are no preventive care reminders to display for this patient.  Colorectal cancer screening: No longer required.   Lung Cancer Screening: (Low Dose CT Chest recommended if Age 63-80 years, 30 pack-year currently smoking OR have quit w/in 15years.) does not qualify.    Additional Screening:  Vision Screening: Recommended annual ophthalmology exams for early detection of glaucoma and other disorders of the eye. Is the patient up to date with their annual eye exam?  Yes  Who is the provider or what is the name of the office in which the patient attends annual eye exams? Dr Wallace Going @ [facility] If pt is not established with a provider, would they like to be referred to a provider to establish care? No .   Dental Screening: Recommended annual dental exams for proper oral hygiene  Community Resource Referral / Chronic Care Management: CRR required this visit?  No   CCM required this visit?  No      Plan:     I have personally reviewed and noted the following in the patient's chart:   . Medical and social history . Use of alcohol, tobacco or illicit drugs  . Current medications and supplements . Functional ability and status . Nutritional status . Physical activity . Advanced directives . List of other physicians . Hospitalizations, surgeries, and ER visits in previous 12 months . Vitals . Screenings to include cognitive, depression, and falls . Referrals and appointments  In addition, I have reviewed and discussed with patient certain preventive protocols, quality metrics, and best practice recommendations. A written personalized care plan for preventive services as well as general preventive health recommendations were provided to patient.     Fabiola Mudgett [place], [place]   06/08/8414  Nurse Notes: None.

## 2020-07-10 ENCOUNTER — Ambulatory Visit (INDEPENDENT_AMBULATORY_CARE_PROVIDER_SITE_OTHER): Payer: PPO

## 2020-07-10 ENCOUNTER — Other Ambulatory Visit: Payer: Self-pay

## 2020-07-10 ENCOUNTER — Other Ambulatory Visit: Payer: Self-pay | Admitting: Family Medicine

## 2020-07-10 DIAGNOSIS — Z0000 Encounter for general adult medical examination without abnormal findings: Secondary | ICD-10-CM | POA: Diagnosis not present

## 2020-07-10 NOTE — Patient Instructions (Signed)
[name] , Thank you for taking time to come for your Medicare Wellness Visit. I appreciate your ongoing commitment to your health goals. Please review the following plan we discussed and let me know if I can assist you in the future.   Screening recommendations/referrals: Colonoscopy: No longer required.  Recommended yearly ophthalmology/optometry visit for glaucoma screening and checkup Recommended yearly dental visit for hygiene and checkup  Vaccinations: Influenza vaccine: Done 02/27/20 Pneumococcal vaccine: Completed series Tdap vaccine: Up to date, due 09/2022 Shingles vaccine: Shingrix discussed. Please contact your pharmacy for coverage information.     Advanced directives: Please bring a copy of your POA (Power of Attorney) and/or Living Will to your next appointment.   Conditions/risks identified: Recommend to continue cut back to 3 cups of caffeine a day and increase water intake to 6-8 8 oz glasses a day.   Next appointment: 09/06/20 @ 8:40 AM with Dr Caryn Section   Preventive Care 2 Years and Older, Male Preventive care refers to lifestyle choices and visits with your health care provider that can promote health and wellness. What does preventive care include?  A yearly physical exam. This is also called an annual well check.  Dental exams once or twice a year.  Routine eye exams. Ask your health care provider how often you should have your eyes checked.  Personal lifestyle choices, including:  Daily care of your teeth and gums.  Regular physical activity.  Eating a healthy diet.  Avoiding tobacco and drug use.  Limiting alcohol use.  Practicing safe sex.  Taking low doses of aspirin every day.  Taking vitamin and mineral supplements as recommended by your health care provider. What happens during an annual well check? The services and screenings done by your health care provider during your annual well check will depend on your age, overall health, lifestyle risk  factors, and family history of disease. Counseling  Your health care provider may ask you questions about your:  Alcohol use.  Tobacco use.  Drug use.  Emotional well-being.  Home and relationship well-being.  Sexual activity.  Eating habits.  History of falls.  Memory and ability to understand (cognition).  Work and work Statistician. Screening  You may have the following tests or measurements:  Height, weight, and BMI.  Blood pressure.  Lipid and cholesterol levels. These may be checked every 5 years, or more frequently if you are over 66 years old.  Skin check.  Lung cancer screening. You may have this screening every year starting at age 68 if you have a 30-pack-year history of smoking and currently smoke or have quit within the past 15 years.  Fecal occult blood test (FOBT) of the stool. You may have this test every year starting at age 40.  Flexible sigmoidoscopy or colonoscopy. You may have a sigmoidoscopy every 5 years or a colonoscopy every 10 years starting at age 82.  Prostate cancer screening. Recommendations will vary depending on your family history and other risks.  Hepatitis C blood test.  Hepatitis B blood test.  Sexually transmitted disease (STD) testing.  Diabetes screening. This is done by checking your blood sugar (glucose) after you have not eaten for a while (fasting). You may have this done every 1-3 years.  Abdominal aortic aneurysm (AAA) screening. You may need this if you are a current or former smoker.  Osteoporosis. You may be screened starting at age 62 if you are at high risk. Talk with your health care provider about your test results, treatment  options, and if necessary, the need for more tests. Vaccines  Your health care provider may recommend certain vaccines, such as:  Influenza vaccine. This is recommended every year.  Tetanus, diphtheria, and acellular pertussis (Tdap, Td) vaccine. You may need a Td booster every 10  years.  Zoster vaccine. You may need this after age 61.  Pneumococcal 13-valent conjugate (PCV13) vaccine. One dose is recommended after age 54.  Pneumococcal polysaccharide (PPSV23) vaccine. One dose is recommended after age 1. Talk to your health care provider about which screenings and vaccines you need and how often you need them. This information is not intended to replace advice given to you by your health care provider. Make sure you discuss any questions you have with your health care provider. Document Released: 06/14/2015 Document Revised: 02/05/2016 Document Reviewed: 03/19/2015 Elsevier Interactive Patient Education  2017 [employer] Prevention in the Home Falls can cause injuries. They can happen to people of all ages. There are many things you can do to make your home safe and to help prevent falls. What can I do on the outside of my home?  Regularly fix the edges of walkways and driveways and fix any cracks.  Remove anything that might make you trip as you walk through a door, such as a raised step or threshold.  Trim any bushes or trees on the path to your home.  Use bright outdoor lighting.  Clear any walking paths of anything that might make someone trip, such as rocks or tools.  Regularly check to see if handrails are loose or broken. Make sure that both sides of any steps have handrails.  Any raised decks and porches should have guardrails on the edges.  Have any leaves, snow, or ice cleared regularly.  Use sand or salt on walking paths during winter.  Clean up any spills in your garage right away. This includes oil or grease spills. What can I do in the bathroom?  Use night lights.  Install grab bars by the toilet and in the tub and shower. Do not use towel bars as grab bars.  Use non-skid mats or decals in the tub or shower.  If you need to sit down in the shower, use a plastic, non-slip stool.  Keep the floor dry. Clean up any water that  spills on the floor as soon as it happens.  Remove soap buildup in the tub or shower regularly.  Attach bath mats securely with double-sided non-slip rug tape.  Do not have throw rugs and other things on the floor that can make you trip. What can I do in the bedroom?  Use night lights.  Make sure that you have a light by your bed that is easy to reach.  Do not use any sheets or blankets that are too big for your bed. They should not hang down onto the floor.  Have a firm chair that has side arms. You can use this for support while you get dressed.  Do not have throw rugs and other things on the floor that can make you trip. What can I do in the kitchen?  Clean up any spills right away.  Avoid walking on wet floors.  Keep items that you use a lot in easy-to-reach places.  If you need to reach something above you, use a strong step stool that has a grab bar.  Keep electrical cords out of the way.  Do not use floor polish or wax that makes floors slippery.  If you must use wax, use non-skid floor wax.  Do not have throw rugs and other things on the floor that can make you trip. What can I do with my stairs?  Do not leave any items on the stairs.  Make sure that there are handrails on both sides of the stairs and use them. Fix handrails that are broken or loose. Make sure that handrails are as long as the stairways.  Check any carpeting to make sure that it is firmly attached to the stairs. Fix any carpet that is loose or worn.  Avoid having throw rugs at the top or bottom of the stairs. If you do have throw rugs, attach them to the floor with carpet tape.  Make sure that you have a light switch at the top of the stairs and the bottom of the stairs. If you do not have them, ask someone to add them for you. What else can I do to help prevent falls?  Wear shoes that:  Do not have high heels.  Have rubber bottoms.  Are comfortable and fit you well.  Are closed at the  toe. Do not wear sandals.  If you use a stepladder:  Make sure that it is fully opened. Do not climb a closed stepladder.  Make sure that both sides of the stepladder are locked into place.  Ask someone to hold it for you, if possible.  Clearly mark and make sure that you can see:  Any grab bars or handrails.  First and last steps.  Where the edge of each step is.  Use tools that help you move around (mobility aids) if they are needed. These include:  Canes.  Walkers.  Scooters.  Crutches.  Turn on the lights when you go into a dark area. Replace any light bulbs as soon as they burn out.  Set up your furniture so you have a clear path. Avoid moving your furniture around.  If any of your floors are uneven, fix them.  If there are any pets around you, be aware of where they are.  Review your medicines with your doctor. Some medicines can make you feel dizzy. This can increase your chance of falling. Ask your doctor what other things that you can do to help prevent falls. This information is not intended to replace advice given to you by your health care provider. Make sure you discuss any questions you have with your health care provider. Document Released: 03/14/2009 Document Revised: 10/24/2015 Document Reviewed: 06/22/2014 Elsevier Interactive Patient Education  2017 [employer].

## 2020-07-10 NOTE — Telephone Encounter (Signed)
Requested medications are due for refill today yes  Requested medications are on the active medication list yes  Last refill 11/13  Last visit 05/28/20  Future visit scheduled 09/06/20  Notes to clinic Historical Provider

## 2020-07-12 ENCOUNTER — Other Ambulatory Visit: Payer: Self-pay | Admitting: Family Medicine

## 2020-07-12 MED ORDER — NEEDLES & SYRINGES MISC: 1 refills | Status: AC

## 2020-07-29 ENCOUNTER — Other Ambulatory Visit: Payer: Self-pay | Admitting: *Deleted

## 2020-07-29 DIAGNOSIS — R972 Elevated prostate specific antigen [PSA]: Secondary | ICD-10-CM

## 2020-07-30 ENCOUNTER — Other Ambulatory Visit: Payer: PPO

## 2020-07-30 ENCOUNTER — Other Ambulatory Visit: Payer: Self-pay

## 2020-07-30 DIAGNOSIS — R972 Elevated prostate specific antigen [PSA]: Secondary | ICD-10-CM | POA: Diagnosis not present

## 2020-07-31 LAB — PSA: Prostate Specific Ag, Serum: 2.9 ng/mL (ref 0.0–4.0)

## 2020-07-31 NOTE — Progress Notes (Signed)
error °

## 2020-08-01 ENCOUNTER — Encounter: Payer: Self-pay | Admitting: Urology

## 2020-08-01 ENCOUNTER — Other Ambulatory Visit: Payer: Self-pay

## 2020-08-01 ENCOUNTER — Ambulatory Visit: Payer: PPO | Admitting: Urology

## 2020-08-01 VITALS — BP 153/88 | HR 60 | Ht 72.0 in | Wt 180.0 lb

## 2020-08-01 DIAGNOSIS — R972 Elevated prostate specific antigen [PSA]: Secondary | ICD-10-CM

## 2020-08-01 DIAGNOSIS — N529 Male erectile dysfunction, unspecified: Secondary | ICD-10-CM

## 2020-08-01 DIAGNOSIS — N401 Enlarged prostate with lower urinary tract symptoms: Secondary | ICD-10-CM

## 2020-08-01 DIAGNOSIS — N138 Other obstructive and reflux uropathy: Secondary | ICD-10-CM | POA: Diagnosis not present

## 2020-08-01 NOTE — Progress Notes (Signed)
08/01/2020 12:06 PM   Celestino Darlyn Read Sr. [birth year] [id number]  Referring provider: Birdie Sons, [place],  [place] [address]  Chief Complaint  Patient presents with  . Follow-up    40mth follow-up    HPI: Jacob JOHNPATRICK JENNY Sr. is a 87 y.o. male  with an elevated PSA, ED and BPH with LUTS who presents today for follow up.  Elevated PSA PSA Trend  6.0 (12) in 2012 - bx negative - on finasteride  3.6 (7.2) in 09/2011  5.4 (10.8) in 04/2012  3.2 (6.4) in 09/2012  3.6 (7.2) in 04/2013  10.2 in 07/2015 - no finasteride  9.4 in 07/2015 - on finasteride  6.4 in 07/2015 - no finasteride  5.0 in 11/2015  7.7 in 11/2016             9.3 in 11/2017  7.4 in 04/2018 - no finasteride  7.3 in 07/2018 - no finasteride, patient had to stop 06/2018 due to itching; will no longer attempt finasteride  9.3 in 11/2018  12/2018 prostate MRI negative for high grade lesions  PSAD 0.138  7.9 in 07/2019 - restarted the finasteride 5 mg on 05/10/2019  7.9 in 07/2019   3.4 (6.8) in 12/2019  2.9 (5.8) in 07/2020 He does not have a family history of prostate cancer.      BPH WITH LUTS  (prostate and/or bladder) IPSS score: 16/2  Previous score: 15/2   Previous PVR: 79 mL  He states his stream has slowed up some.   Patient denies any modifying or aggravating factors.  Patient denies any gross hematuria, dysuria or suprapubic/flank pain.  Patient denies any fevers, chills, nausea or vomiting.   Patient is currently taking doxazosin 4 mg daily and finasteride 5 mg daily.     IPSS    Row Name 08/01/20 1000         International Prostate Symptom Score   How often have you had the sensation of not emptying your bladder? Less than half the time     How often have you had to urinate less than every two hours? Less than half the time     How often have you found you stopped and started again several times when you urinated? About half the time     How often have you found it  difficult to postpone urination? Less than half the time     How often have you had a weak urinary stream? More than half the time     How often have you had to strain to start urination? Less than 1 in 5 times     How many times did you typically get up at night to urinate? 2 Times     Total IPSS Score 16           Quality of Life due to urinary symptoms   If you were to spend the rest of your life with your urinary condition just the way it is now how would you feel about that? Mostly Satisfied           Score:  1-7 Mild 8-19 Moderate 20-35 Severe  Erectile dysfunction Lack of libido  PMH: Past Medical History:  Diagnosis Date  . Arthritis    right hand  . BPH (benign prostatic hyperplasia)   . ED (erectile dysfunction)   . Elevated PSA   . GERD (gastroesophageal reflux disease)    occasional  . Glaucoma   .  Gout   . Gout   . History of alcoholism ([place])   . History of chicken pox   . History of measles   . History of mumps   . History of pernicious anemia   . HTN (hypertension)    controlled  . Hypothyroidism   . Wears partial dentures    upper and lower     Surgical History: Past Surgical History:  Procedure Laterality Date  . EYE SURGERY Right 10/24/2019   laser eye surgery on right eye for glaucoma  . PARATHYROIDECTOMY  2004  . PHOTOCOAGULATION WITH LASER Right 10/07/2015   Procedure: PHOTOCOAGULATION WITH LASER RIGHT EYE TRANS SCLERAL;  Surgeon: Ronnell Freshwater, MD;  Location: [facility];  Service: Ophthalmology;  Laterality: Right;  . TONSILLECTOMY      Home Medications:  Allergies as of 08/01/2020      Reactions   Krystexxa  [pegloticase] Anaphylaxis   Allopurinol    Abdominal pain and itching   Ciprofloxacin    causes mouth to itch   Levofloxacin Other (See Comments)   Tendon pain   Mycophenolate Mofetil    Niacin    Other reaction(s): UNKNOWN   Sulfa Antibiotics Itching   Uloric  [febuxostat]    Other reaction(s):  Abdominal pain   Finasteride Itching      Medication List       Accurate as of August 01, 2020 12:06 PM. If you have any questions, ask your nurse or doctor.        STOP taking these medications   predniSONE 10 MG tablet Commonly known as: DELTASONE Stopped by: SHANNON MCGOWAN, PA-C     TAKE these medications   allopurinol 100 MG tablet Commonly known as: ZYLOPRIM TAKE 1 TABLET BY MOUTH TWICE A DAY   brimonidine 0.2 % ophthalmic solution Commonly known as: ALPHAGAN Place 1 drop into both eyes 2 (two) times daily.   colchicine 0.6 MG tablet TAKE 1 TABLET BY MOUTH EVERY DAY AS NEEDED FOR GOUT   cyanocobalamin 1000 MCG/ML injection Commonly known as: (VITAMIN B-12) INJECT 1 ML (1,000 MCG TOTAL) INTO THE MUSCLE EVERY 30 (THIRTY) DAYS.   dorzolamide-timolol 22.3-6.8 MG/ML ophthalmic solution Commonly known as: COSOPT Place 1 drop into both eyes 2 (two) times daily.   doxazosin 8 MG tablet Commonly known as: CARDURA Take 0.5 tablets (4 mg total) by mouth daily.   finasteride 5 MG tablet Commonly known as: PROSCAR TAKE 1 TABLET BY MOUTH EVERY DAY   hydrOXYzine 25 MG tablet Commonly known as: ATARAX/VISTARIL Take 1 tablet (25 mg total) by mouth 2 (two) times daily as needed for itching.   irbesartan 150 MG tablet Commonly known as: AVAPRO TAKE 1 TABLET BY MOUTH EVERY DAY   latanoprost 0.005 % ophthalmic solution Commonly known as: XALATAN Place 1 drop into both eyes at bedtime.   levothyroxine 150 MCG tablet Commonly known as: SYNTHROID TAKE 1 TABLET (150 MCG TOTAL) BY MOUTH DAILY AT 6 (SIX) AM.   multivitamin tablet Take 1 tablet by mouth daily.   Needles & Syringes Misc Use one each month for vitamin b12 injection   sildenafil 20 MG tablet Commonly known as: REVATIO Take 3 to 5 tablets two hours before intercouse on an empty stomach.  Do not take with nitrates.   vitamin C 100 MG tablet Take by mouth daily. Dose unsure   Vitamin D3 10 MCG (400 UNIT)  tablet Take by mouth daily. Unsure dose       Allergies:  Allergies  Allergen Reactions  . Krystexxa  [Pegloticase] Anaphylaxis  . Allopurinol     Abdominal pain and itching  . Ciprofloxacin     causes mouth to itch  . Levofloxacin Other (See Comments)    Tendon pain  . Mycophenolate Mofetil   . Niacin     Other reaction(s): UNKNOWN  . Sulfa Antibiotics Itching  . Uloric  [Febuxostat]     Other reaction(s): Abdominal pain  . Finasteride Itching    Family History: Family History  Problem Relation Age of Onset  . Asthma Mother   . Heart attack Father   . Parkinson's disease Father   . Kidney disease Neg Hx   . Prostate cancer Neg Hx        maybe paternal grandfather ? unsure    Social History:  reports that he quit smoking about 42 years ago. His smoking use included cigarettes. He has a 30.00 pack-year smoking history. He has never used smokeless tobacco. He reports that he does not drink alcohol and does not use drugs.  ROS: For pertinent review of systems please refer to history of present illness  Physical Exam: BP (!) 153/88   Pulse 60   Ht 6' (1.829 m)   Wt 180 lb (81.6 kg)   BMI 24.41 kg/m   Constitutional:  Well nourished. Alert and oriented, No acute distress. HEENT: [place] AT, mask in place.  Trachea midline Cardiovascular: No clubbing, cyanosis, or edema. Respiratory: Normal respiratory effort, no increased work of breathing. GU: No CVA tenderness.  No bladder fullness or masses.  Patient with uncircumcised phallus.  Foreskin easily retracted  Urethral meatus is patent.  No penile discharge. No penile lesions or rashes. Scrotum without lesions, cysts, rashes and/or edema.  Testicles are located scrotally bilaterally. No masses are appreciated in the testicles. Left and right epididymis are normal. Rectal: Patient with  normal sphincter tone. Anus and perineum without scarring or rashes. No rectal masses are appreciated. Prostate is approximately 50 grams,  could only palpate the apex and midportion of the gland, no nodules are appreciated. Seminal vesicles are normal. Skin: No rashes, bruises or suspicious lesions. Lymph: No inguinal adenopathy. Neurologic: Grossly intact, no focal deficits, moving all 4 extremities. Psychiatric: Normal mood and affect.  Laboratory Data:  Lab Results  Component Value Date   CREATININE 1.50 (H) 03/08/2020   Lab Results  Component Value Date   TSH 0.091 (L) 03/08/2020   Lab Results  Component Value Date   AST 15 03/08/2020   Lab Results  Component Value Date   ALT 13 03/08/2020   PSA Trend  See HPI I have reviewed the labs.  Assessment & Plan:    1. Elevated PSA - Current PSA 2.9 (5.8) - neg prostate MRI 12/2018 - PSAD 0.138  2. BPH with LUTS - IPSS score is stable - continue the finasteride 5 mg daily and doxazosin 4 mg - 3. Erectile dysfunction:  - not sexually active at this time  Return in about 1 year (around 08/01/2021) for IPSS, SHIM, PSA and exam.  Zara Council, [facility]  [address] [place], [place] [address] [phone]

## 2020-08-02 ENCOUNTER — Other Ambulatory Visit: Payer: Self-pay | Admitting: Family Medicine

## 2020-08-03 ENCOUNTER — Other Ambulatory Visit: Payer: Self-pay | Admitting: Family Medicine

## 2020-08-03 NOTE — Telephone Encounter (Signed)
Requested Prescriptions  Pending Prescriptions Disp Refills  . doxazosin (CARDURA) 8 MG tablet [Pharmacy Med Name: DOXAZOSIN MESYLATE 8 MG TAB] 45 tablet 1    Sig: TAKE 1/2 OF A TABLET (4 MG TOTAL) BY MOUTH DAILY.     Cardiovascular:  Alpha Blockers Failed - 08/03/2020 12:49 AM      Failed - Last BP in normal range    BP Readings from Last 1 Encounters:  08/01/20 (!) 153/88         Passed - Valid encounter within last 6 months    Recent Outpatient Visits          2 months ago Essential (primary) hypertension   [facility] Birdie Sons, MD   4 months ago Hyperuricemia   [facility] Birdie Sons, MD   11 months ago Essential (primary) hypertension   [facility] Birdie Sons, MD   1 year ago Need for influenza vaccination   [facility] Birdie Sons, MD   1 year ago Essential (primary) hypertension   [place], Kirstie Peri, MD      Future Appointments            In 1 month Fisher, Kirstie Peri, MD [facility] [place]   In 12 months McGowan, Gordan Payment [facility]

## 2020-09-02 DIAGNOSIS — H401133 Primary open-angle glaucoma, bilateral, severe stage: Secondary | ICD-10-CM | POA: Diagnosis not present

## 2020-09-05 NOTE — Progress Notes (Signed)
Established patient visit   Patient: Jacob WANAT Sr.   DOB: March 08, 1934   87 y.o. Male  MRN: [id number] Visit Date: 09/06/2020  Today's healthcare provider: Lelon Huh, MD   Chief Complaint  Patient presents with  . Hypertension  . Hypothyroidism   Subjective    HPI  Hypertension, follow-up  BP Readings from Last 3 Encounters:  09/06/20 128/77  08/01/20 (!) 153/88  05/28/20 132/71   Wt Readings from Last 3 Encounters:  09/06/20 187 lb (84.8 kg)  08/01/20 180 lb (81.6 kg)  05/28/20 187 lb (84.8 kg)     He was last seen for hypertension 3 months ago.  BP at that visit was 132/71. Management since that visit includes continuing same medications.  He reports good compliance with treatment. He is not having side effects.  He is following a Regular diet. He is exercising. He does not smoke.  Use of agents associated with hypertension: thyroid hormones.   Outside blood pressures are 202-542 systolic; patient unsure of the diastolic readings. Symptoms: No chest pain No chest pressure  No palpitations No syncope  No dyspnea No orthopnea  No paroxysmal nocturnal dyspnea No lower extremity edema   Pertinent labs: Lab Results  Component Value Date   CHOL 152 08/22/2019   HDL 38 (L) 08/22/2019   LDLCALC 87 08/22/2019   TRIG 154 (H) 08/22/2019   CHOLHDL 4.0 08/22/2019   Lab Results  Component Value Date   NA 139 03/08/2020   K 4.5 03/08/2020   CREATININE 1.50 (H) 03/08/2020   GFRNONAA 42 (L) 03/08/2020   GFRAA 49 (L) 03/08/2020   GLUCOSE 91 03/08/2020     The ASCVD Risk score (Goff DC Jr., et al., 2013) failed to calculate for the following reasons:   The 2013 ASCVD risk score is only valid for ages 16 to 77   ---------------------------------------------------------------------------------------------------  Hypothyroid, follow-up  Lab Results  Component Value Date   TSH 0.091 (L) 03/08/2020   TSH 0.159 (L) 08/22/2019   TSH 0.137 (L) 10/17/2018    Wt Readings from Last 3 Encounters:  09/06/20 187 lb (84.8 kg)  08/01/20 180 lb (81.6 kg)  05/28/20 187 lb (84.8 kg)    He was last seen for hypothyroid 6 months ago.  Management since that visit includes continuing same medications. He reports good compliance with treatment. He is not having side effects.   Symptoms: Yes change in energy level No constipation  No diarrhea No heat / cold intolerance  No nervousness No palpitations  No weight changes    ----------------------------------------------------------------------------------------- He also complains of pain on the sides of his hips when he is trying to sleep at night. Is worse on the right. Gets better when he gets up and moves around, but flares back up after laying down. Hurts to lay on side. Sometimes pain goes into his left knee.      Medications: Outpatient Medications Prior to Visit  Medication Sig  . allopurinol (ZYLOPRIM) 100 MG tablet TAKE 1 TABLET BY MOUTH TWICE A DAY  . Ascorbic Acid (VITAMIN C) 100 MG tablet Take by mouth daily. Dose unsure  . brimonidine (ALPHAGAN) 0.2 % ophthalmic solution Place 1 drop into both eyes 2 (two) times daily.  . Cholecalciferol (VITAMIN D3) 10 MCG (400 UNIT) tablet Take by mouth daily. Unsure dose  . colchicine 0.6 MG tablet TAKE 1 TABLET BY MOUTH EVERY DAY AS NEEDED FOR GOUT  . cyanocobalamin (,VITAMIN B-12,) 1000 MCG/ML injection INJECT 1  ML (1,000 MCG TOTAL) INTO THE MUSCLE EVERY 30 (THIRTY) DAYS.  Marland Kitchen dorzolamide-timolol (COSOPT) 22.3-6.8 MG/ML ophthalmic solution Place 1 drop into both eyes 2 (two) times daily.   Marland Kitchen doxazosin (CARDURA) 8 MG tablet TAKE 1/2 OF A TABLET (4 MG TOTAL) BY MOUTH DAILY.  . finasteride (PROSCAR) 5 MG tablet TAKE 1 TABLET BY MOUTH EVERY DAY  . hydrOXYzine (ATARAX/VISTARIL) 25 MG tablet Take 1 tablet (25 mg total) by mouth 2 (two) times daily as needed for itching.  . irbesartan (AVAPRO) 150 MG tablet TAKE 1 TABLET BY MOUTH EVERY DAY  . latanoprost  (XALATAN) 0.005 % ophthalmic solution Place 1 drop into both eyes at bedtime.   Marland Kitchen levothyroxine (SYNTHROID) 150 MCG tablet TAKE 1 TABLET (150 MCG TOTAL) BY MOUTH DAILY AT 6 (SIX) AM.  . Multiple Vitamin (MULTIVITAMIN) tablet Take 1 tablet by mouth daily.  . Needles & Syringes MISC Use one each month for vitamin b12 injection  . sildenafil (REVATIO) 20 MG tablet Take 3 to 5 tablets two hours before intercouse on an empty stomach.  Do not take with nitrates.   No facility-administered medications prior to visit.    Review of Systems  Constitutional: Positive for fatigue. Negative for appetite change, chills and fever.  Respiratory: Negative for chest tightness, shortness of breath and wheezing.   Cardiovascular: Negative for chest pain and palpitations.  Gastrointestinal: Negative for abdominal pain, nausea and vomiting.  Musculoskeletal: Positive for arthralgias (bilateral hip pain).       Objective    BP 128/77 (BP Location: Left Arm, Patient Position: Sitting, Cuff Size: Normal)   Pulse 66   Temp (!) 97.5 F (36.4 C) (Temporal)   Resp 14   Wt 187 lb (84.8 kg)   BMI 25.36 kg/m     Physical Exam   General: Appearance:     Overweight male in no acute distress  Eyes:    PERRL, conjunctiva/corneas clear, EOM's intact       Lungs:     Clear to auscultation bilaterally, respirations unlabored  Heart:    Normal heart rate. Normal rhythm. No murmurs, rubs, or gallops.   MS:   All extremities are intact. Tender right greater trochanter.   Neurologic:   Awake, alert, oriented x 3. No apparent focal neurological           defect.         Assessment & Plan     1. Trochanteric bursitis of both hips  - meloxicam (MOBIC) 7.5 MG tablet; Take 1 tablet (7.5 mg total) by mouth daily.  Dispense: 30 tablet; Refill: 0  2. Essential (primary) hypertension Very well controlled. Continue current medications.   - CBC - Comprehensive metabolic panel  3. Hypothyroidism, unspecified type  -  TSH - T4, free   No follow-ups on file.         Lelon Huh, MD  [facility] [phone] (phone) [phone] (fax)  [place]

## 2020-09-06 ENCOUNTER — Encounter: Payer: Self-pay | Admitting: Family Medicine

## 2020-09-06 ENCOUNTER — Other Ambulatory Visit: Payer: Self-pay

## 2020-09-06 ENCOUNTER — Ambulatory Visit (INDEPENDENT_AMBULATORY_CARE_PROVIDER_SITE_OTHER): Payer: PPO | Admitting: Family Medicine

## 2020-09-06 VITALS — BP 128/77 | HR 66 | Temp 97.5°F | Resp 14 | Wt 187.0 lb

## 2020-09-06 DIAGNOSIS — M7061 Trochanteric bursitis, right hip: Secondary | ICD-10-CM | POA: Diagnosis not present

## 2020-09-06 DIAGNOSIS — E039 Hypothyroidism, unspecified: Secondary | ICD-10-CM | POA: Diagnosis not present

## 2020-09-06 DIAGNOSIS — I10 Essential (primary) hypertension: Secondary | ICD-10-CM

## 2020-09-06 DIAGNOSIS — M7062 Trochanteric bursitis, left hip: Secondary | ICD-10-CM

## 2020-09-06 MED ORDER — MELOXICAM 7.5 MG PO TABS: 7.5000 mg | ORAL_TABLET | Freq: Every day | ORAL | 0 refills | Status: DC

## 2020-09-07 LAB — CBC
Hematocrit: 39.5 % (ref 37.5–51.0)
Hemoglobin: 13.6 g/dL (ref 13.0–17.7)
MCH: 30.6 pg (ref 26.6–33.0)
MCHC: 34.4 g/dL (ref 31.5–35.7)
MCV: 89 fL (ref 79–97)
Platelets: 185 10*3/uL (ref 150–450)
RBC: 4.45 x10E6/uL (ref 4.14–5.80)
RDW: 13.5 % (ref 11.6–15.4)
WBC: 6.9 10*3/uL (ref 3.4–10.8)

## 2020-09-07 LAB — COMPREHENSIVE METABOLIC PANEL
ALT: 11 IU/L (ref 0–44)
AST: 17 IU/L (ref 0–40)
Albumin/Globulin Ratio: 2 (ref 1.2–2.2)
Albumin: 4.4 g/dL (ref 3.6–4.6)
Alkaline Phosphatase: 89 IU/L (ref 44–121)
BUN/Creatinine Ratio: 16 (ref 10–24)
BUN: 23 mg/dL (ref 8–27)
Bilirubin Total: 0.7 mg/dL (ref 0.0–1.2)
CO2: 23 mmol/L (ref 20–29)
Calcium: 9.4 mg/dL (ref 8.6–10.2)
Chloride: 104 mmol/L (ref 96–106)
Creatinine, Ser: 1.47 mg/dL — ABNORMAL HIGH (ref 0.76–1.27)
Globulin, Total: 2.2 g/dL (ref 1.5–4.5)
Glucose: 78 mg/dL (ref 65–99)
Potassium: 4.2 mmol/L (ref 3.5–5.2)
Sodium: 143 mmol/L (ref 134–144)
Total Protein: 6.6 g/dL (ref 6.0–8.5)
eGFR: 47 mL/min/{1.73_m2} — ABNORMAL LOW (ref >59)

## 2020-09-07 LAB — T4, FREE: Free T4: 1.89 ng/dL — ABNORMAL HIGH (ref 0.82–1.77)

## 2020-09-07 LAB — TSH: TSH: 0.145 u[IU]/mL — ABNORMAL LOW (ref 0.450–4.500)

## 2020-09-09 ENCOUNTER — Telehealth: Payer: Self-pay

## 2020-09-09 MED ORDER — LEVOTHYROXINE SODIUM 137 MCG PO TABS: 137.0000 ug | ORAL_TABLET | Freq: Every day | ORAL | 1 refills | Status: DC

## 2020-09-09 NOTE — Telephone Encounter (Signed)
-----   Message from Birdie Sons, MD sent at 09/09/2020  6:52 AM EDT ----- Labs show he is a little hyPERthyroid. Need to reduce levothyroxine to 180mcg one tablet daily, #90, rf x 1. Rest of labs are good.. follow up in 1 1/2 months to check on thyroid.

## 2020-09-09 NOTE — Telephone Encounter (Signed)
Pt advised.  RX sent to [facility] on [address] .

## 2020-09-14 ENCOUNTER — Other Ambulatory Visit: Payer: Self-pay | Admitting: Family Medicine

## 2020-09-14 DIAGNOSIS — M7061 Trochanteric bursitis, right hip: Secondary | ICD-10-CM

## 2020-09-14 DIAGNOSIS — M109 Gout, unspecified: Secondary | ICD-10-CM

## 2020-09-14 NOTE — Telephone Encounter (Signed)
Requested Prescriptions  Pending Prescriptions Disp Refills  . colchicine 0.6 MG tablet [Pharmacy Med Name: COLCHICINE 0.6 MG TABLET] 90 tablet 0    Sig: TAKE 1 TABLET BY MOUTH EVERY DAY AS NEEDED FOR GOUT     Endocrinology:  Gout Agents Failed - 09/14/2020  8:44 AM      Failed - Cr in normal range and within 360 days    Creatinine, Ser  Date Value Ref Range Status  09/06/2020 1.47 (H) 0.76 - 1.27 mg/dL Final         Passed - Uric Acid in normal range and within 360 days    Uric Acid  Date Value Ref Range Status  03/08/2020 7.3 3.8 - 8.4 mg/dL Final    Comment:               Therapeutic target for gout patients: <6.0         Passed - Valid encounter within last 12 months    Recent Outpatient Visits          1 week ago Trochanteric bursitis of both hips   [facility] Birdie Sons, MD   3 months ago Essential (primary) hypertension   [facility] Birdie Sons, MD   6 months ago Hyperuricemia   [facility] Birdie Sons, MD   1 year ago Essential (primary) hypertension   [facility] Birdie Sons, MD   1 year ago Need for influenza vaccination   [facility] Birdie Sons, MD      Future Appointments            In 10 months McGowan, Gordan Payment [facility]           . meloxicam (MOBIC) 7.5 MG tablet [Pharmacy Med Name: MELOXICAM 7.5 MG TABLET] 30 tablet     Sig: TAKE 1 TABLET BY MOUTH EVERY DAY     Analgesics:  COX2 Inhibitors Failed - 09/14/2020  8:44 AM      Failed - Cr in normal range and within 360 days    Creatinine, Ser  Date Value Ref Range Status  09/06/2020 1.47 (H) 0.76 - 1.27 mg/dL Final         Passed - HGB in normal range and within 360 days    Hemoglobin  Date Value Ref Range Status  09/06/2020 13.6 13.0 - 17.7 g/dL Final         Passed - Patient is not pregnant      Passed - Valid encounter within last 12 months    Recent  Outpatient Visits          1 week ago Trochanteric bursitis of both hips   [facility] Birdie Sons, MD   3 months ago Essential (primary) hypertension   [facility] Birdie Sons, MD   6 months ago Hyperuricemia   [facility] Birdie Sons, MD   1 year ago Essential (primary) hypertension   [facility] Birdie Sons, MD   1 year ago Need for influenza vaccination   [facility] Birdie Sons, MD      Future Appointments            In 10 months McGowan, Gordan Payment [facility]

## 2020-09-23 DIAGNOSIS — H2589 Other age-related cataract: Secondary | ICD-10-CM | POA: Diagnosis not present

## 2020-09-30 ENCOUNTER — Other Ambulatory Visit: Payer: Self-pay

## 2020-09-30 ENCOUNTER — Encounter: Payer: Self-pay | Admitting: Ophthalmology

## 2020-10-02 ENCOUNTER — Other Ambulatory Visit: Payer: Self-pay | Admitting: Family Medicine

## 2020-10-02 DIAGNOSIS — M7061 Trochanteric bursitis, right hip: Secondary | ICD-10-CM

## 2020-10-02 NOTE — Telephone Encounter (Signed)
Requested Prescriptions  Pending Prescriptions Disp Refills  . meloxicam (MOBIC) 7.5 MG tablet [Pharmacy Med Name: MELOXICAM 7.5 MG TABLET] 30 tablet 0    Sig: TAKE 1 TABLET BY MOUTH EVERY DAY     Analgesics:  COX2 Inhibitors Failed - 10/02/2020 11:32 AM      Failed - Cr in normal range and within 360 days    Creatinine, Ser  Date Value Ref Range Status  09/06/2020 1.47 (H) 0.76 - 1.27 mg/dL Final         Passed - HGB in normal range and within 360 days    Hemoglobin  Date Value Ref Range Status  09/06/2020 13.6 13.0 - 17.7 g/dL Final         Passed - Patient is not pregnant      Passed - Valid encounter within last 12 months    Recent Outpatient Visits          3 weeks ago Trochanteric bursitis of both hips   [facility] Birdie Sons, MD   4 months ago Essential (primary) hypertension   [facility] Birdie Sons, MD   6 months ago Hyperuricemia   [facility] Birdie Sons, MD   1 year ago Essential (primary) hypertension   [facility] Birdie Sons, MD   1 year ago Need for influenza vaccination   [facility] Birdie Sons, MD      Future Appointments            In 10 months McGowan, Gordan Payment [facility]

## 2020-10-03 NOTE — Discharge Instructions (Signed)

## 2020-10-08 ENCOUNTER — Other Ambulatory Visit: Payer: Self-pay

## 2020-10-08 ENCOUNTER — Ambulatory Visit: Payer: PPO | Admitting: Anesthesiology

## 2020-10-08 ENCOUNTER — Encounter: Payer: Self-pay | Admitting: Ophthalmology

## 2020-10-08 ENCOUNTER — Encounter
Admission: RE | Disposition: A | Discharge status: Home / Self Care | Payer: Self-pay | Source: Ambulatory Visit | Attending: Ophthalmology

## 2020-10-08 ENCOUNTER — Ambulatory Visit
Admission: RE | Admit: 2020-10-08 | Discharge: 2020-10-08 | Disposition: A | Discharge status: Home / Self Care | Payer: PPO | Source: Ambulatory Visit | Attending: Ophthalmology | Admitting: Ophthalmology

## 2020-10-08 DIAGNOSIS — Z87891 Personal history of nicotine dependence: Secondary | ICD-10-CM | POA: Diagnosis not present

## 2020-10-08 DIAGNOSIS — I129 Hypertensive chronic kidney disease with stage 1 through stage 4 chronic kidney disease, or unspecified chronic kidney disease: Secondary | ICD-10-CM | POA: Diagnosis not present

## 2020-10-08 DIAGNOSIS — H2511 Age-related nuclear cataract, right eye: Secondary | ICD-10-CM | POA: Diagnosis not present

## 2020-10-08 DIAGNOSIS — E890 Postprocedural hypothyroidism: Secondary | ICD-10-CM | POA: Diagnosis not present

## 2020-10-08 DIAGNOSIS — Z8619 Personal history of other infectious and parasitic diseases: Secondary | ICD-10-CM | POA: Diagnosis not present

## 2020-10-08 DIAGNOSIS — N400 Enlarged prostate without lower urinary tract symptoms: Secondary | ICD-10-CM | POA: Insufficient documentation

## 2020-10-08 DIAGNOSIS — N183 Chronic kidney disease, stage 3 unspecified: Secondary | ICD-10-CM | POA: Insufficient documentation

## 2020-10-08 DIAGNOSIS — H2589 Other age-related cataract: Secondary | ICD-10-CM | POA: Diagnosis not present

## 2020-10-08 DIAGNOSIS — Z881 Allergy status to other antibiotic agents status: Secondary | ICD-10-CM | POA: Insufficient documentation

## 2020-10-08 DIAGNOSIS — Z8249 Family history of ischemic heart disease and other diseases of the circulatory system: Secondary | ICD-10-CM | POA: Diagnosis not present

## 2020-10-08 DIAGNOSIS — Z882 Allergy status to sulfonamides status: Secondary | ICD-10-CM | POA: Insufficient documentation

## 2020-10-08 DIAGNOSIS — Z79899 Other long term (current) drug therapy: Secondary | ICD-10-CM | POA: Diagnosis not present

## 2020-10-08 DIAGNOSIS — H25811 Combined forms of age-related cataract, right eye: Secondary | ICD-10-CM | POA: Diagnosis not present

## 2020-10-08 DIAGNOSIS — Z888 Allergy status to other drugs, medicaments and biological substances status: Secondary | ICD-10-CM | POA: Diagnosis not present

## 2020-10-08 HISTORY — PX: CATARACT EXTRACTION W/PHACO: SHX586

## 2020-10-08 HISTORY — DX: Chronic kidney disease, stage 3 unspecified: N18.30

## 2020-10-08 SURGERY — PHACOEMULSIFICATION, CATARACT, WITH IOL INSERTION
Anesthesia: Monitor Anesthesia Care | Site: Eye | Laterality: Right

## 2020-10-08 MED ORDER — MOXIFLOXACIN HCL 0.5 % OP SOLN
OPHTHALMIC | Status: DC | PRN
  Administered 2020-10-08: 0.2 mL via OPHTHALMIC

## 2020-10-08 MED ORDER — BRIMONIDINE TARTRATE-TIMOLOL 0.2-0.5 % OP SOLN
OPHTHALMIC | Status: DC | PRN
  Administered 2020-10-08: 1 [drp] via OPHTHALMIC

## 2020-10-08 MED ORDER — ONDANSETRON HCL 4 MG/2ML IJ SOLN: 4.0000 mg | Freq: Once | INTRAMUSCULAR | Status: DC | PRN

## 2020-10-08 MED ORDER — FENTANYL CITRATE (PF) 100 MCG/2ML IJ SOLN
INTRAMUSCULAR | Status: DC | PRN
  Administered 2020-10-08: 50 ug via INTRAVENOUS

## 2020-10-08 MED ORDER — MIDAZOLAM HCL 2 MG/2ML IJ SOLN
INTRAMUSCULAR | Status: DC | PRN
  Administered 2020-10-08: 1 mg via INTRAVENOUS

## 2020-10-08 MED ORDER — NA CHONDROIT SULF-NA HYALURON 40-17 MG/ML IO SOLN
INTRAOCULAR | Status: DC | PRN
  Administered 2020-10-08: 1 mL via INTRAOCULAR

## 2020-10-08 MED ORDER — ACETAMINOPHEN 10 MG/ML IV SOLN: 1000.0000 mg | Freq: Once | INTRAVENOUS | Status: DC | PRN

## 2020-10-08 MED ORDER — PHENYLEPHRINE-KETOROLAC 1-0.3 % IO SOLN
INTRAOCULAR | Status: DC | PRN
  Administered 2020-10-08: 110 mL via OPHTHALMIC

## 2020-10-08 MED ORDER — LIDOCAINE HCL (PF) 2 % IJ SOLN
INTRAOCULAR | Status: DC | PRN
  Administered 2020-10-08: 1 mL via INTRAMUSCULAR

## 2020-10-08 MED ORDER — TETRACAINE HCL 0.5 % OP SOLN
1.0000 [drp] | OPHTHALMIC | Status: DC | PRN
  Administered 2020-10-08 (×3): 1 [drp] via OPHTHALMIC

## 2020-10-08 MED ORDER — LACTATED RINGERS IV SOLN: INTRAVENOUS | Status: DC

## 2020-10-08 MED ORDER — ARMC OPHTHALMIC DILATING DROPS
1.0000 "application " | OPHTHALMIC | Status: DC | PRN
  Administered 2020-10-08 (×3): 1 via OPHTHALMIC

## 2020-10-08 SURGICAL SUPPLY — 18 items
CANNULA ANT/CHMB 27GA (MISCELLANEOUS) ×4 IMPLANT
GLOVE SURG LX 8.0 MICRO (GLOVE) ×1
GLOVE SURG LX STRL 8.0 MICRO (GLOVE) ×1 IMPLANT
GLOVE SURG TRIUMPH 8.0 PF LTX (GLOVE) ×2 IMPLANT
GOWN STRL REUS W/ TWL LRG LVL3 (GOWN DISPOSABLE) ×2 IMPLANT
GOWN STRL REUS W/TWL LRG LVL3 (GOWN DISPOSABLE) ×4
LENS IOL TECNIS EYHANCE 18.0 (Intraocular Lens) ×2 IMPLANT
MARKER SKIN DUAL TIP RULER LAB (MISCELLANEOUS) ×2 IMPLANT
NEEDLE FILTER BLUNT 18X 1/2SAF (NEEDLE) ×1
NEEDLE FILTER BLUNT 18X1 1/2 (NEEDLE) ×1 IMPLANT
PACK EYE AFTER SURG (MISCELLANEOUS) ×2 IMPLANT
PACK OPTHALMIC (MISCELLANEOUS) ×2 IMPLANT
PACK PORFILIO (MISCELLANEOUS) ×2 IMPLANT
RING MALYGIN (MISCELLANEOUS) ×2 IMPLANT
SYR 3ML LL SCALE MARK (SYRINGE) ×2 IMPLANT
SYR TB 1ML LUER SLIP (SYRINGE) ×2 IMPLANT
WATER STERILE IRR 250ML POUR (IV SOLUTION) ×2 IMPLANT
WIPE NON LINTING 3.25X3.25 (MISCELLANEOUS) ×2 IMPLANT

## 2020-10-08 NOTE — Transfer of Care (Signed)
Immediate Anesthesia Transfer of Care Note  Patient: Jacob SEUFERT Sr.  Procedure(s) Performed: CATARACT EXTRACTION PHACO AND INTRAOCULAR LENS PLACEMENT ([place]) RIGHT OMIDRIA (Right Eye)  Patient Location: PACU  Anesthesia Type: MAC  Level of Consciousness: awake, alert  and patient cooperative  Airway and Oxygen Therapy: Patient Spontanous Breathing and Patient connected to supplemental oxygen  Post-op Assessment: Post-op Vital signs reviewed, Patient's Cardiovascular Status Stable, Respiratory Function Stable, Patent Airway and No signs of Nausea or vomiting  Post-op Vital Signs: Reviewed and stable  Complications: No complications documented.

## 2020-10-08 NOTE — Anesthesia Postprocedure Evaluation (Signed)
Anesthesia Post Note  Patient: Jacob MCBRIEN Sr.  Procedure(s) Performed: CATARACT EXTRACTION PHACO AND INTRAOCULAR LENS PLACEMENT ([place]) RIGHT OMIDRIA (Right Eye)     Patient location during evaluation: PACU Anesthesia Type: MAC Level of consciousness: awake and alert Pain management: pain level controlled Vital Signs Assessment: post-procedure vital signs reviewed and stable Respiratory status: spontaneous breathing, nonlabored ventilation, respiratory function stable and patient connected to nasal cannula oxygen Cardiovascular status: stable and blood pressure returned to baseline Postop Assessment: no apparent nausea or vomiting Anesthetic complications: no   No complications documented.  Tyshaun Vinzant A  Floretta Petro

## 2020-10-08 NOTE — Anesthesia Preprocedure Evaluation (Signed)
Anesthesia Evaluation  Patient identified by MRN, date of birth, ID band Patient awake    Reviewed: Allergy & Precautions, NPO status , Patient's Chart, lab work & pertinent test results, reviewed documented beta blocker date and time   History of Anesthesia Complications Negative for: history of anesthetic complications  Airway Mallampati: II  TM Distance: >3 FB Neck ROM: Limited    Dental  (+)    Pulmonary former smoker,    breath sounds clear to auscultation       Cardiovascular hypertension, (-) angina(-) DOE  Rhythm:Regular Rate:Normal     Neuro/Psych    GI/Hepatic GERD  ,  Endo/Other  Hypothyroidism   Renal/GU Renal disease     Musculoskeletal  (+) Arthritis ,   Abdominal   Peds  Hematology  (+) anemia ,   Anesthesia Other Findings   Reproductive/Obstetrics                             Anesthesia Physical Anesthesia Plan  ASA: II  Anesthesia Plan: MAC   Post-op Pain Management:    Induction: Intravenous  PONV Risk Score and Plan: 1 and TIVA, Midazolam and Treatment may vary due to age or medical condition  Airway Management Planned: Nasal Cannula  Additional Equipment:   Intra-op Plan:   Post-operative Plan:   Informed Consent: I have reviewed the patients History and Physical, chart, labs and discussed the procedure including the risks, benefits and alternatives for the proposed anesthesia with the patient or authorized representative who has indicated his/her understanding and acceptance.       Plan Discussed with: CRNA and Anesthesiologist  Anesthesia Plan Comments:         Anesthesia Quick Evaluation

## 2020-10-08 NOTE — H&P (Signed)
[facility]   Primary Care Physician:  Birdie Sons, MD Ophthalmologist: Dr. George Ina  Pre-Procedure History & Physical: HPI:  Jacob Najjar Sr. is a 87 y.o. male here for cataract surgery.   Past Medical History:  Diagnosis Date  . Arthritis    right hand  . BPH (benign prostatic hyperplasia)   . CKD (chronic kidney disease), stage III ([place])   . COVID-19 05/2020  . ED (erectile dysfunction)   . Elevated PSA   . GERD (gastroesophageal reflux disease)    occasional  . Glaucoma   . Gout   . Gout   . History of alcoholism ([place])   . History of chicken pox   . History of measles   . History of mumps   . History of pernicious anemia   . HTN (hypertension)    controlled  . Hypothyroidism   . Wears partial dentures    upper and lower     Past Surgical History:  Procedure Laterality Date  . EYE SURGERY Right 10/24/2019   laser eye surgery on right eye for glaucoma  . PARATHYROIDECTOMY  2004  . PHOTOCOAGULATION WITH LASER Right 10/07/2015   Procedure: PHOTOCOAGULATION WITH LASER RIGHT EYE TRANS SCLERAL;  Surgeon: Ronnell Freshwater, MD;  Location: [place];  Service: Ophthalmology;  Laterality: Right;  . TONSILLECTOMY      Prior to Admission medications   Medication Sig Start Date End Date Taking? Authorizing Provider  allopurinol (ZYLOPRIM) 100 MG tablet TAKE 1 TABLET BY MOUTH TWICE A DAY 11/17/19  Yes Birdie Sons, MD  Ascorbic Acid (VITAMIN C) 100 MG tablet Take by mouth daily. Dose unsure   Yes [provider]  brimonidine (ALPHAGAN) 0.2 % ophthalmic solution Place 1 drop into both eyes 2 (two) times daily. 07/18/18  Yes [provider]  Cholecalciferol (VITAMIN D3) 10 MCG (400 UNIT) tablet Take by mouth daily. Unsure dose   Yes [provider]  colchicine 0.6 MG tablet TAKE 1 TABLET BY MOUTH EVERY DAY AS NEEDED FOR GOUT 09/14/20  Yes Fisher, Kirstie Peri, MD  cyanocobalamin (,VITAMIN B-12,) 1000 MCG/ML injection INJECT 1  ML (1,000 MCG TOTAL) INTO THE MUSCLE EVERY 30 (THIRTY) DAYS. 07/11/20  Yes Birdie Sons, MD  dorzolamide-timolol (COSOPT) 22.3-6.8 MG/ML ophthalmic solution Place 1 drop into both eyes 2 (two) times daily.    Yes [provider]  doxazosin (CARDURA) 8 MG tablet TAKE 1/2 OF A TABLET (4 MG TOTAL) BY MOUTH DAILY. 08/03/20  Yes Birdie Sons, MD  finasteride (PROSCAR) 5 MG tablet TAKE 1 TABLET BY MOUTH EVERY DAY 07/08/20  Yes McGowan, Larene Beach A, PA-C  hydrOXYzine (ATARAX/VISTARIL) 25 MG tablet Take 1 tablet (25 mg total) by mouth 2 (two) times daily as needed for itching. 06/23/19  Yes Birdie Sons, MD  irbesartan (AVAPRO) 150 MG tablet TAKE 1 TABLET BY MOUTH EVERY DAY 08/02/20  Yes Birdie Sons, MD  latanoprost (XALATAN) 0.005 % ophthalmic solution Place 1 drop into both eyes at bedtime.    Yes [provider]  levothyroxine (SYNTHROID) 137 MCG tablet Take 1 tablet (137 mcg total) by mouth daily before breakfast. 09/09/20  Yes Birdie Sons, MD  meloxicam (MOBIC) 7.5 MG tablet TAKE 1 TABLET BY MOUTH EVERY DAY 10/02/20  Yes Birdie Sons, MD  Multiple Vitamin (MULTIVITAMIN) tablet Take 1 tablet by mouth daily.   Yes [provider]  sildenafil (REVATIO) 20 MG tablet Take 3 to 5 tablets two hours  before intercouse on an empty stomach.  Do not take with nitrates. 07/26/18  Yes Zara Council A, PA-C  Needles & Syringes MISC Use one each month for vitamin b12 injection 07/12/20   Birdie Sons, MD    Allergies as of 09/03/2020 - Review Complete 08/01/2020  Allergen Reaction Noted  . Krystexxa  [pegloticase] Anaphylaxis 03/08/2015  . Allopurinol  03/08/2015  . Ciprofloxacin  03/08/2015  . Levofloxacin Other (See Comments) 07/11/2015  . Mycophenolate mofetil  07/26/2017  . Niacin  07/11/2015  . Sulfa antibiotics Itching 03/08/2015  . Uloric  [febuxostat]  03/08/2015  . Finasteride Itching 07/26/2018    Family History  Problem Relation Age of Onset  . Asthma  Mother   . Heart attack Father   . Parkinson's disease Father   . Kidney disease Neg Hx   . Prostate cancer Neg Hx        maybe paternal grandfather ? unsure    Social History   Socioeconomic History  . Marital status: Married    Spouse name: Azazel Franze  . Number of children: 2  . Years of education: Not on file  . Highest education level: Some college, no degree  Occupational History  . Occupation: Retired    Comment: Previously worked at a [employer]  . Smoking status: Former Smoker    Packs/day: 1.50    Years: 20.00    Pack years: 30.00    Types: Cigarettes    Quit date: 06/01/1978    Years since quitting: 42.3  . Smokeless tobacco: Never Used  Vaping Use  . Vaping Use: Never used  Substance and Sexual Activity  . Alcohol use: No    Alcohol/week: 0.0 standard drinks    Comment: former Alcoholic, meets with AA once a week. Has been alcohol free for 34 years  . Drug use: No  . Sexual activity: Not on file  Other Topics Concern  . Not on file  Social History Narrative  . Not on file   Social Determinants of Health   Financial Resource Strain: Low Risk   . Difficulty of Paying Living Expenses: Not hard at all  Food Insecurity: No Food Insecurity  . Worried About Charity fundraiser in the Last Year: Never true  . Ran Out of Food in the Last Year: Never true  Transportation Needs: No Transportation Needs  . Lack of Transportation (Medical): No  . Lack of Transportation (Non-Medical): No  Physical Activity: Inactive  . Days of Exercise per Week: 0 days  . Minutes of Exercise per Session: 0 min  Stress: No Stress Concern Present  . Feeling of Stress : Not at all  Social Connections: Moderately Integrated  . Frequency of Communication with Friends and Family: More than three times a week  . Frequency of Social Gatherings with Friends and Family: More than three times a week  . Attends Religious Services: More than 4 times per year  . Active Member  of Clubs or Organizations: No  . Attends Archivist Meetings: Never  . Marital Status: Married  Human resources officer Violence: Not At Risk  . Fear of Current or Ex-Partner: No  . Emotionally Abused: No  . Physically Abused: No  . Sexually Abused: No    Review of Systems: See HPI, otherwise negative ROS  Physical Exam: BP (!) 183/82   Pulse 76   Temp (!) 97 F (36.1 C) (Temporal)   Resp 18   Ht 6' (1.829 m)  Wt 78.1 kg   SpO2 99%   BMI 23.34 kg/m  General:   Alert,  pleasant and cooperative in NAD Head:  Normocephalic and atraumatic. Respiratory:  Normal work of breathing. Cardiovascular:  RRR  Impression/Plan: Jacob Darlyn Read Sr. is here for cataract surgery.  Risks, benefits, limitations, and alternatives regarding cataract surgery have been reviewed with the patient.  Questions have been answered.  All parties agreeable.   Birder Robson, MD  10/08/2020, 9:14 AM

## 2020-10-08 NOTE — Op Note (Signed)
PREOPERATIVE DIAGNOSIS:  Nuclear sclerotic cataract of the right eye.   POSTOPERATIVE DIAGNOSIS:  H25.89 Cataract   OPERATIVE PROCEDURE:@   SURGEON:  Birder Robson, MD.   ANESTHESIA:  Anesthesiologist: Heniser, Fredric Dine, MD CRNA: Cameron Ali, CRNA  1.      Managed anesthesia care. 2.      0.29ml of Shugarcaine was instilled in the eye following the paracentesis.   COMPLICATIONS:  None.   TECHNIQUE:   Stop and chop   DESCRIPTION OF PROCEDURE:  The patient was examined and consented in the preoperative holding area where the aforementioned topical anesthesia was applied to the right eye and then brought back to the Operating Room where the right eye was prepped and draped in the usual sterile ophthalmic fashion and a lid speculum was placed. A paracentesis was created with the side port blade and the anterior chamber was filled with viscoelastic. A near clear corneal incision was performed with the steel keratome. A continuous curvilinear capsulorrhexis was performed with a cystotome followed by the capsulorrhexis forceps. Hydrodissection and hydrodelineation were carried out with BSS on a blunt cannula. The lens was removed in a stop and chop  technique and the remaining cortical material was removed with the irrigation-aspiration handpiece. The capsular bag was inflated with viscoelastic and the Technis [id number]  lens was placed in the capsular bag without complication. The remaining viscoelastic was removed from the eye with the irrigation-aspiration handpiece. The wounds were hydrated. The anterior chamber was flushed with BSS and the eye was inflated to physiologic pressure. 0.39ml of Vigamox was placed in the anterior chamber. The wounds were found to be water tight. The eye was dressed with Combigan. The patient was given protective glasses to wear throughout the day and a shield with which to sleep tonight. The patient was also given drops with which to begin a drop regimen today and will  follow-up with me in one day. Implant Name Type Inv. Item Serial No. Manufacturer Lot No. LRB No. Used Action  LENS IOL TECNIS EYHANCE 18.0 - [id number] Intraocular Lens LENS IOL TECNIS EYHANCE 18.0 [id number] JOHNSON   Right 1 Implanted   Procedure(s) with comments: CATARACT EXTRACTION PHACO AND INTRAOCULAR LENS PLACEMENT ([facility]) RIGHT OMIDRIA (Right) - CDE 27.87 02:08.6 minutes  Electronically signed: Birder Robson 10/08/2020 11:37 AM

## 2020-10-08 NOTE — Anesthesia Procedure Notes (Signed)
Procedure Name: MAC Date/Time: 10/08/2020 9:28 AM Performed by: Cameron Ali, CRNA Pre-anesthesia Checklist: Patient identified, Emergency Drugs available, Suction available, Timeout performed and Patient being monitored Patient Re-evaluated:Patient Re-evaluated prior to induction Oxygen Delivery Method: Nasal cannula Placement Confirmation: positive ETCO2

## 2020-10-09 ENCOUNTER — Encounter: Payer: Self-pay | Admitting: Ophthalmology

## 2020-10-11 ENCOUNTER — Encounter: Payer: Self-pay | Admitting: Ophthalmology

## 2020-10-14 DIAGNOSIS — H2512 Age-related nuclear cataract, left eye: Secondary | ICD-10-CM | POA: Diagnosis not present

## 2020-10-15 ENCOUNTER — Other Ambulatory Visit: Payer: Self-pay | Admitting: Family Medicine

## 2020-10-20 NOTE — Anesthesia Preprocedure Evaluation (Addendum)
Anesthesia Evaluation  Patient identified by MRN, date of birth, ID band Patient awake    Reviewed: Allergy & Precautions, NPO status , Patient's Chart, lab work & pertinent test results  History of Anesthesia Complications Negative for: history of anesthetic complications  Airway Mallampati: IV   Neck ROM: Full    Dental  (+) Partial Upper, Partial Lower   Pulmonary former smoker (quit 1980),    Pulmonary exam normal breath sounds clear to auscultation       Cardiovascular hypertension, Normal cardiovascular exam Rhythm:Regular Rate:Normal     Neuro/Psych negative neurological ROS     GI/Hepatic GERD  ,  Endo/Other  Hypothyroidism   Renal/GU Renal disease (stage III CKD)     Musculoskeletal  (+) Arthritis ,   Abdominal   Peds  Hematology  (+) Blood dyscrasia, anemia ,   Anesthesia Other Findings   Reproductive/Obstetrics                            Anesthesia Physical Anesthesia Plan  ASA: II  Anesthesia Plan: MAC   Post-op Pain Management:    Induction: Intravenous  PONV Risk Score and Plan: 1 and TIVA, Midazolam and Treatment may vary due to age or medical condition  Airway Management Planned: Nasal Cannula  Additional Equipment:   Intra-op Plan:   Post-operative Plan:   Informed Consent: I have reviewed the patients History and Physical, chart, labs and discussed the procedure including the risks, benefits and alternatives for the proposed anesthesia with the patient or authorized representative who has indicated his/her understanding and acceptance.       Plan Discussed with: CRNA  Anesthesia Plan Comments:        Anesthesia Quick Evaluation

## 2020-10-21 NOTE — Discharge Instructions (Signed)

## 2020-10-22 ENCOUNTER — Ambulatory Visit: Payer: PPO | Admitting: Anesthesiology

## 2020-10-22 ENCOUNTER — Ambulatory Visit
Admission: RE | Admit: 2020-10-22 | Discharge: 2020-10-22 | Disposition: A | Discharge status: Home / Self Care | Payer: PPO | Source: Ambulatory Visit | Attending: Ophthalmology | Admitting: Ophthalmology

## 2020-10-22 ENCOUNTER — Other Ambulatory Visit: Payer: Self-pay

## 2020-10-22 ENCOUNTER — Encounter: Payer: Self-pay | Admitting: Ophthalmology

## 2020-10-22 ENCOUNTER — Encounter
Admission: RE | Disposition: A | Discharge status: Home / Self Care | Payer: Self-pay | Source: Ambulatory Visit | Attending: Ophthalmology

## 2020-10-22 DIAGNOSIS — E039 Hypothyroidism, unspecified: Secondary | ICD-10-CM | POA: Diagnosis not present

## 2020-10-22 DIAGNOSIS — Z8616 Personal history of COVID-19: Secondary | ICD-10-CM | POA: Insufficient documentation

## 2020-10-22 DIAGNOSIS — Z79890 Hormone replacement therapy (postmenopausal): Secondary | ICD-10-CM | POA: Insufficient documentation

## 2020-10-22 DIAGNOSIS — Z888 Allergy status to other drugs, medicaments and biological substances status: Secondary | ICD-10-CM | POA: Insufficient documentation

## 2020-10-22 DIAGNOSIS — Z8249 Family history of ischemic heart disease and other diseases of the circulatory system: Secondary | ICD-10-CM | POA: Diagnosis not present

## 2020-10-22 DIAGNOSIS — I129 Hypertensive chronic kidney disease with stage 1 through stage 4 chronic kidney disease, or unspecified chronic kidney disease: Secondary | ICD-10-CM | POA: Insufficient documentation

## 2020-10-22 DIAGNOSIS — Z881 Allergy status to other antibiotic agents status: Secondary | ICD-10-CM | POA: Insufficient documentation

## 2020-10-22 DIAGNOSIS — Z8619 Personal history of other infectious and parasitic diseases: Secondary | ICD-10-CM | POA: Insufficient documentation

## 2020-10-22 DIAGNOSIS — Z79899 Other long term (current) drug therapy: Secondary | ICD-10-CM | POA: Insufficient documentation

## 2020-10-22 DIAGNOSIS — Z87891 Personal history of nicotine dependence: Secondary | ICD-10-CM | POA: Insufficient documentation

## 2020-10-22 DIAGNOSIS — Z882 Allergy status to sulfonamides status: Secondary | ICD-10-CM | POA: Insufficient documentation

## 2020-10-22 DIAGNOSIS — N183 Chronic kidney disease, stage 3 unspecified: Secondary | ICD-10-CM | POA: Diagnosis not present

## 2020-10-22 DIAGNOSIS — H25812 Combined forms of age-related cataract, left eye: Secondary | ICD-10-CM | POA: Diagnosis not present

## 2020-10-22 DIAGNOSIS — H2512 Age-related nuclear cataract, left eye: Secondary | ICD-10-CM | POA: Diagnosis not present

## 2020-10-22 DIAGNOSIS — Z791 Long term (current) use of non-steroidal anti-inflammatories (NSAID): Secondary | ICD-10-CM | POA: Insufficient documentation

## 2020-10-22 HISTORY — PX: CATARACT EXTRACTION W/PHACO: SHX586

## 2020-10-22 SURGERY — PHACOEMULSIFICATION, CATARACT, WITH IOL INSERTION
Anesthesia: Monitor Anesthesia Care | Site: Eye | Laterality: Left

## 2020-10-22 MED ORDER — TETRACAINE HCL 0.5 % OP SOLN
1.0000 [drp] | OPHTHALMIC | Status: DC | PRN
  Administered 2020-10-22 (×3): 1 [drp] via OPHTHALMIC

## 2020-10-22 MED ORDER — FENTANYL CITRATE (PF) 100 MCG/2ML IJ SOLN
INTRAMUSCULAR | Status: DC | PRN
  Administered 2020-10-22: 50 ug via INTRAVENOUS

## 2020-10-22 MED ORDER — EPINEPHRINE PF 1 MG/ML IJ SOLN
INTRAOCULAR | Status: DC | PRN
  Administered 2020-10-22: 54 mL via OPHTHALMIC

## 2020-10-22 MED ORDER — MIDAZOLAM HCL 2 MG/2ML IJ SOLN
INTRAMUSCULAR | Status: DC | PRN
  Administered 2020-10-22: 1 mg via INTRAVENOUS

## 2020-10-22 MED ORDER — ARMC OPHTHALMIC DILATING DROPS
1.0000 "application " | OPHTHALMIC | Status: DC | PRN
  Administered 2020-10-22 (×2): 1 via OPHTHALMIC

## 2020-10-22 MED ORDER — NA CHONDROIT SULF-NA HYALURON 40-17 MG/ML IO SOLN
INTRAOCULAR | Status: DC | PRN
  Administered 2020-10-22: 1 mL via INTRAOCULAR

## 2020-10-22 MED ORDER — LIDOCAINE HCL (PF) 2 % IJ SOLN
INTRAOCULAR | Status: DC | PRN
  Administered 2020-10-22: 1 mL

## 2020-10-22 MED ORDER — MOXIFLOXACIN HCL 0.5 % OP SOLN
OPHTHALMIC | Status: DC | PRN
  Administered 2020-10-22: 0.2 mL via OPHTHALMIC

## 2020-10-22 MED ORDER — BRIMONIDINE TARTRATE-TIMOLOL 0.2-0.5 % OP SOLN
OPHTHALMIC | Status: DC | PRN
  Administered 2020-10-22: 1 [drp] via OPHTHALMIC

## 2020-10-22 MED ORDER — LACTATED RINGERS IV SOLN: INTRAVENOUS | Status: DC

## 2020-10-22 SURGICAL SUPPLY — 15 items
CANNULA ANT/CHMB 27GA (MISCELLANEOUS) ×4 IMPLANT
GLOVE SURG TRIUMPH 8.0 PF LTX (GLOVE) ×2 IMPLANT
GOWN STRL REUS W/ TWL LRG LVL3 (GOWN DISPOSABLE) ×2 IMPLANT
GOWN STRL REUS W/TWL LRG LVL3 (GOWN DISPOSABLE) ×4
LENS IOL TECNIS EYHANCE 18.0 (Intraocular Lens) ×2 IMPLANT
MARKER SKIN DUAL TIP RULER LAB (MISCELLANEOUS) ×2 IMPLANT
NEEDLE FILTER BLUNT 18X 1/2SAF (NEEDLE) ×1
NEEDLE FILTER BLUNT 18X1 1/2 (NEEDLE) ×1 IMPLANT
PACK EYE AFTER SURG (MISCELLANEOUS) ×2 IMPLANT
PACK OPTHALMIC (MISCELLANEOUS) ×2 IMPLANT
PACK PORFILIO (MISCELLANEOUS) ×2 IMPLANT
SYR 3ML LL SCALE MARK (SYRINGE) ×2 IMPLANT
SYR TB 1ML LUER SLIP (SYRINGE) ×2 IMPLANT
WATER STERILE IRR 250ML POUR (IV SOLUTION) ×2 IMPLANT
WIPE NON LINTING 3.25X3.25 (MISCELLANEOUS) ×2 IMPLANT

## 2020-10-22 NOTE — Anesthesia Postprocedure Evaluation (Signed)
Anesthesia Post Note  Patient: Jacob GING Sr.  Procedure(s) Performed: CATARACT EXTRACTION PHACO AND INTRAOCULAR LENS PLACEMENT ([facility]) LEFT (Left Eye)     Patient location during evaluation: PACU Anesthesia Type: MAC Level of consciousness: awake and alert, oriented and patient cooperative Pain management: pain level controlled Vital Signs Assessment: post-procedure vital signs reviewed and stable Respiratory status: spontaneous breathing, nonlabored ventilation and respiratory function stable Cardiovascular status: blood pressure returned to baseline and stable Postop Assessment: adequate PO intake Anesthetic complications: no   No complications documented.  Darrin Nipper

## 2020-10-22 NOTE — Op Note (Signed)
PREOPERATIVE DIAGNOSIS:  Nuclear sclerotic cataract of the left eye.   POSTOPERATIVE DIAGNOSIS:  Nuclear sclerotic cataract of the left eye.   OPERATIVE PROCEDURE:@   SURGEON:  Birder Robson, MD.   ANESTHESIA:  Anesthesiologist: Darrin Nipper, MD CRNA: Vanetta Shawl, CRNA; Cameron Ali, CRNA  1.      Managed anesthesia care. 2.     0.20ml of Shugarcaine was instilled following the paracentesis   COMPLICATIONS:  None.   TECHNIQUE:   Stop and chop   DESCRIPTION OF PROCEDURE:  The patient was examined and consented in the preoperative holding area where the aforementioned topical anesthesia was applied to the left eye and then brought back to the Operating Room where the left eye was prepped and draped in the usual sterile ophthalmic fashion and a lid speculum was placed. A paracentesis was created with the side port blade and the anterior chamber was filled with viscoelastic. A near clear corneal incision was performed with the steel keratome. A continuous curvilinear capsulorrhexis was performed with a cystotome followed by the capsulorrhexis forceps. Hydrodissection and hydrodelineation were carried out with BSS on a blunt cannula. The lens was removed in a stop and chop  technique and the remaining cortical material was removed with the irrigation-aspiration handpiece. The capsular bag was inflated with viscoelastic and the Technis [id number] lens was placed in the capsular bag without complication. The remaining viscoelastic was removed from the eye with the irrigation-aspiration handpiece. The wounds were hydrated. The anterior chamber was flushed with BSS and the eye was inflated to physiologic pressure. 0.93ml Vigamox was placed in the anterior chamber. The wounds were found to be water tight. The eye was dressed with Combigan. The patient was given protective glasses to wear throughout the day and a shield with which to sleep tonight. The patient was also given drops with which to begin a  drop regimen today and will follow-up with me in one day. Implant Name Type Inv. Item Serial No. Manufacturer Lot No. LRB No. Used Action  LENS IOL TECNIS EYHANCE 18.0 - [id number] Intraocular Lens LENS IOL TECNIS EYHANCE 18.0 [id number] JOHNSON   Left 1 Implanted    Procedure(s) with comments: CATARACT EXTRACTION PHACO AND INTRAOCULAR LENS PLACEMENT (IOC) LEFT (Left) - 7.97 00:50.1  Electronically signed: Birder Robson 10/22/2020 9:50 AM

## 2020-10-22 NOTE — Transfer of Care (Addendum)
Immediate Anesthesia Transfer of Care Note  Patient: Jacob PELLOW Sr.  Procedure(s) Performed: CATARACT EXTRACTION PHACO AND INTRAOCULAR LENS PLACEMENT ([place]) LEFT (Left Eye)  Patient Location: PACU  Anesthesia Type: MAC  Level of Consciousness: awake, alert  and patient cooperative  Airway and Oxygen Therapy: Patient Spontanous Breathing   Post-op Assessment: Post-op Vital signs reviewed, Patient's Cardiovascular Status Stable, Respiratory Function Stable, Patent Airway and No signs of Nausea or vomiting  Post-op Vital Signs: Reviewed and stable  Complications: No complications documented.

## 2020-10-22 NOTE — H&P (Signed)
[facility]   Primary Care Physician:  Birdie Sons, MD Ophthalmologist: Dr. George Ina  Pre-Procedure History & Physical: HPI:  Jacob Najjar Sr. is a 87 y.o. male here for cataract surgery.   Past Medical History:  Diagnosis Date  . Arthritis    right hand  . BPH (benign prostatic hyperplasia)   . CKD (chronic kidney disease), stage III ([place])   . COVID-19 05/2020  . ED (erectile dysfunction)   . Elevated PSA   . GERD (gastroesophageal reflux disease)    occasional  . Glaucoma   . Gout   . Gout   . History of alcoholism ([place])   . History of chicken pox   . History of measles   . History of mumps   . History of pernicious anemia   . HTN (hypertension)    controlled  . Hypothyroidism   . Wears partial dentures    upper and lower     Past Surgical History:  Procedure Laterality Date  . CATARACT EXTRACTION W/PHACO Right 10/08/2020   Procedure: CATARACT EXTRACTION PHACO AND INTRAOCULAR LENS PLACEMENT ([place]) RIGHT OMIDRIA;  Surgeon: Birder Robson, MD;  Location: [place];  Service: Ophthalmology;  Laterality: Right;  CDE 27.87 02:08.6 minutes  . EYE SURGERY Right 10/24/2019   laser eye surgery on right eye for glaucoma  . PARATHYROIDECTOMY  2004  . PHOTOCOAGULATION WITH LASER Right 10/07/2015   Procedure: PHOTOCOAGULATION WITH LASER RIGHT EYE TRANS SCLERAL;  Surgeon: Ronnell Freshwater, MD;  Location: [place];  Service: Ophthalmology;  Laterality: Right;  . TONSILLECTOMY      Prior to Admission medications   Medication Sig Start Date End Date Taking? Authorizing Provider  allopurinol (ZYLOPRIM) 100 MG tablet TAKE 1 TABLET BY MOUTH TWICE A DAY 11/17/19  Yes Birdie Sons, MD  Ascorbic Acid (VITAMIN C) 100 MG tablet Take by mouth daily. Dose unsure   Yes [provider]  brimonidine (ALPHAGAN) 0.2 % ophthalmic solution Place 1 drop into both eyes 2 (two) times daily. 07/18/18  Yes [provider]  Cholecalciferol  (VITAMIN D3) 10 MCG (400 UNIT) tablet Take by mouth daily. Unsure dose   Yes [provider]  cyanocobalamin (,VITAMIN B-12,) 1000 MCG/ML injection INJECT 1 ML (1,000 MCG TOTAL) INTO THE MUSCLE EVERY 30 (THIRTY) DAYS. 07/11/20  Yes Birdie Sons, MD  dorzolamide-timolol (COSOPT) 22.3-6.8 MG/ML ophthalmic solution Place 1 drop into both eyes 2 (two) times daily.    Yes [provider]  doxazosin (CARDURA) 8 MG tablet TAKE 1/2 OF A TABLET (4 MG TOTAL) BY MOUTH DAILY. 08/03/20  Yes Birdie Sons, MD  finasteride (PROSCAR) 5 MG tablet TAKE 1 TABLET BY MOUTH EVERY DAY 07/08/20  Yes McGowan, Larene Beach A, PA-C  irbesartan (AVAPRO) 150 MG tablet TAKE 1 TABLET BY MOUTH EVERY DAY 08/02/20  Yes Birdie Sons, MD  latanoprost (XALATAN) 0.005 % ophthalmic solution Place 1 drop into both eyes at bedtime.    Yes [provider]  levothyroxine (SYNTHROID) 137 MCG tablet Take 1 tablet (137 mcg total) by mouth daily before breakfast. 09/09/20  Yes Birdie Sons, MD  meloxicam (MOBIC) 7.5 MG tablet TAKE 1 TABLET BY MOUTH EVERY DAY 10/02/20  Yes Birdie Sons, MD  Multiple Vitamin (MULTIVITAMIN) tablet Take 1 tablet by mouth daily.   Yes [provider]  sildenafil (REVATIO) 20 MG tablet Take 3 to 5 tablets two hours before intercouse on an empty stomach.  Do not take with  nitrates. 07/26/18  Yes McGowan, Larene Beach A, PA-C  colchicine 0.6 MG tablet TAKE 1 TABLET BY MOUTH EVERY DAY AS NEEDED FOR GOUT 09/14/20   Birdie Sons, MD  hydrOXYzine (ATARAX/VISTARIL) 25 MG tablet Take 1 tablet (25 mg total) by mouth 2 (two) times daily as needed for itching. 06/23/19   Birdie Sons, MD  Needles & Syringes MISC Use one each month for vitamin b12 injection 07/12/20   Birdie Sons, MD    Allergies as of 09/03/2020 - Review Complete 08/01/2020  Allergen Reaction Noted  . Krystexxa  [pegloticase] Anaphylaxis 03/08/2015  . Allopurinol  03/08/2015  . Ciprofloxacin  03/08/2015  .  Levofloxacin Other (See Comments) 07/11/2015  . Mycophenolate mofetil  07/26/2017  . Niacin  07/11/2015  . Sulfa antibiotics Itching 03/08/2015  . Uloric  [febuxostat]  03/08/2015  . Finasteride Itching 07/26/2018    Family History  Problem Relation Age of Onset  . Asthma Mother   . Heart attack Father   . Parkinson's disease Father   . Kidney disease Neg Hx   . Prostate cancer Neg Hx        maybe paternal grandfather ? unsure    Social History   Socioeconomic History  . Marital status: Married    Spouse name: Nikolay Demetriou  . Number of children: 2  . Years of education: Not on file  . Highest education level: Some college, no degree  Occupational History  . Occupation: Retired    Comment: Previously worked at a [employer]  . Smoking status: Former Smoker    Packs/day: 1.50    Years: 20.00    Pack years: 30.00    Types: Cigarettes    Quit date: 06/01/1978    Years since quitting: 42.4  . Smokeless tobacco: Never Used  Vaping Use  . Vaping Use: Never used  Substance and Sexual Activity  . Alcohol use: No    Alcohol/week: 0.0 standard drinks    Comment: former Alcoholic, meets with AA once a week. Has been alcohol free for 34 years  . Drug use: No  . Sexual activity: Not on file  Other Topics Concern  . Not on file  Social History Narrative  . Not on file   Social Determinants of Health   Financial Resource Strain: Low Risk   . Difficulty of Paying Living Expenses: Not hard at all  Food Insecurity: No Food Insecurity  . Worried About Charity fundraiser in the Last Year: Never true  . Ran Out of Food in the Last Year: Never true  Transportation Needs: No Transportation Needs  . Lack of Transportation (Medical): No  . Lack of Transportation (Non-Medical): No  Physical Activity: Inactive  . Days of Exercise per Week: 0 days  . Minutes of Exercise per Session: 0 min  Stress: No Stress Concern Present  . Feeling of Stress : Not at all  Social  Connections: Moderately Integrated  . Frequency of Communication with Friends and Family: More than three times a week  . Frequency of Social Gatherings with Friends and Family: More than three times a week  . Attends Religious Services: More than 4 times per year  . Active Member of Clubs or Organizations: No  . Attends Archivist Meetings: Never  . Marital Status: Married  Human resources officer Violence: Not At Risk  . Fear of Current or Ex-Partner: No  . Emotionally Abused: No  . Physically Abused: No  . Sexually Abused:  No    Review of Systems: See HPI, otherwise negative ROS  Physical Exam: BP (!) 176/93   Pulse 60   Temp (!) 97 F (36.1 C) (Temporal)   Ht 6' (1.829 m)   Wt 83.6 kg   SpO2 98%   BMI 25.00 kg/m  General:   Alert,  pleasant and cooperative in NAD Head:  Normocephalic and atraumatic. Respiratory:  Normal work of breathing. Cardiovascular:  RRR  Impression/Plan: Jacob Darlyn Read Sr. is here for cataract surgery.  Risks, benefits, limitations, and alternatives regarding cataract surgery have been reviewed with the patient.  Questions have been answered.  All parties agreeable.   Birder Robson, MD  10/22/2020, 9:24 AM

## 2020-10-22 NOTE — Anesthesia Procedure Notes (Signed)
Procedure Name: MAC Date/Time: 10/22/2020 9:30 AM Performed by: Cameron Ali, CRNA Pre-anesthesia Checklist: Patient identified, Emergency Drugs available, Suction available, Timeout performed and Patient being monitored Patient Re-evaluated:Patient Re-evaluated prior to induction Oxygen Delivery Method: Nasal cannula Placement Confirmation: positive ETCO2

## 2020-10-23 ENCOUNTER — Encounter: Payer: Self-pay | Admitting: Ophthalmology

## 2020-10-29 ENCOUNTER — Other Ambulatory Visit: Payer: Self-pay | Admitting: Family Medicine

## 2020-11-02 ENCOUNTER — Other Ambulatory Visit: Payer: Self-pay | Admitting: Family Medicine

## 2020-11-02 DIAGNOSIS — M7061 Trochanteric bursitis, right hip: Secondary | ICD-10-CM

## 2020-11-02 NOTE — Telephone Encounter (Signed)
Requested medications are due for refill today yes  Requested medications are on the active medication list yes  Last refill 5/4  Last visit 09/06/20 OV for bursitis, given rx 10/03/19 for 30 days  Future visit scheduled 07/2021  Notes to clinic was only given 30 pills, next appt 07/2021, please assess.

## 2020-11-23 ENCOUNTER — Other Ambulatory Visit: Payer: Self-pay | Admitting: Family Medicine

## 2020-12-06 ENCOUNTER — Other Ambulatory Visit: Payer: Self-pay | Admitting: Family Medicine

## 2020-12-06 DIAGNOSIS — M109 Gout, unspecified: Secondary | ICD-10-CM

## 2020-12-23 ENCOUNTER — Telehealth: Payer: Self-pay | Admitting: *Deleted

## 2020-12-23 NOTE — Chronic Care Management (AMB) (Signed)
  Chronic Care Management   Note  12/23/2020 Name: DEUNTE BLEDSOE Sr. MRN: [id number] DOB: December 25, 1933  Greig Darlyn Read Sr. is a 87 y.o. year old male who is a primary care patient of Fisher, Kirstie Peri, MD. I reached out to [place]. by phone today in response to a referral sent by Mr. Koah Darlyn Read Sr.'s PCP Birdie Sons, MD     Mr. Hawkes was given information about Chronic Care Management services today including:  CCM service includes personalized support from designated clinical staff supervised by his physician, including individualized plan of care and coordination with other care providers 24/7 contact phone numbers for assistance for urgent and routine care needs. Service will only be billed when office clinical staff spend 20 minutes or more in a month to coordinate care. Only one practitioner may furnish and bill the service in a calendar month. The patient may stop CCM services at any time (effective at the end of the month) by phone call to the office staff. The patient will be responsible for cost sharing (co-pay) of up to 20% of the service fee (after annual deductible is met).  Patient agreed to services and verbal consent obtained.   Follow up plan: Telephone appointment with care management team member scheduled for: 01/14/2021  Julian Hy, [place] Management  Direct Dial: [phone]

## 2021-01-14 ENCOUNTER — Ambulatory Visit (INDEPENDENT_AMBULATORY_CARE_PROVIDER_SITE_OTHER): Payer: PPO

## 2021-01-14 DIAGNOSIS — I10 Essential (primary) hypertension: Secondary | ICD-10-CM

## 2021-01-14 NOTE — Chronic Care Management (AMB) (Signed)
Chronic Care Management   CCM RN Visit Note  01/14/2021 Name: Jacob GALICIA Sr. MRN: [id number] DOB: 01-02-1934  Subjective: Jacob Najjar Sr. is a 87 y.o. year old male who is a primary care patient of Fisher, Kirstie Peri, MD. The care management team was consulted for assistance with disease management and care coordination needs.    Engaged with patient by telephone for initial visit in response to provider referral for case management and/or care coordination services.   Consent to Services:  The patient was given the following information about Chronic Care Management services: 1. CCM service includes personalized support from designated clinical staff supervised by the primary care provider, including individualized plan of care and coordination with other care providers 2. 24/7 contact phone numbers for assistance for urgent and routine care needs. 3. Service will only be billed when office clinical staff spend 20 minutes or more in a month to coordinate care. 4. Only one practitioner may furnish and bill the service in a calendar month. 5.The patient may stop CCM services at any time (effective at the end of the month) by phone call to the office staff. 6. The patient will be responsible for cost sharing (co-pay) of up to 20% of the service fee (after annual deductible is met).   Patient declined need for chronic care management services.  Requesting medication refills.   Assessment: Review of patient past medical history, allergies, medications, health status, including review of consultants reports, laboratory and other test data, was performed as part of comprehensive evaluation and provision of chronic care management services.   SDOH (Social Determinants of Health) assessments and interventions performed:  SDOH Interventions    Flowsheet Row Most Recent Value  SDOH Interventions   Food Insecurity Interventions Intervention Not Indicated  Transportation Interventions Intervention Not  Indicated        CCM Care Plan  Allergies  Allergen Reactions   Krystexxa  [Pegloticase] Anaphylaxis   Allopurinol     Abdominal pain and itching   Ciprofloxacin     causes mouth to itch   Levofloxacin Other (See Comments)    Tendon pain   Mycophenolate Mofetil    Niacin     Other reaction(s): UNKNOWN   Shellfish Allergy     agrivates gout   Sulfa Antibiotics Itching   Uloric  [Febuxostat]     Other reaction(s): Abdominal pain   Finasteride Itching    Outpatient Encounter Medications as of 01/14/2021  Medication Sig   allopurinol (ZYLOPRIM) 100 MG tablet TAKE 1 TABLET BY MOUTH TWICE A DAY   brimonidine (ALPHAGAN) 0.2 % ophthalmic solution Place 1 drop into both eyes 2 (two) times daily.   cyanocobalamin (,VITAMIN B-12,) 1000 MCG/ML injection INJECT 1 ML (1,000 MCG TOTAL) INTO THE MUSCLE EVERY 30 (THIRTY) DAYS.   dorzolamide-timolol (COSOPT) 22.3-6.8 MG/ML ophthalmic solution Place 1 drop into both eyes 2 (two) times daily.    doxazosin (CARDURA) 8 MG tablet TAKE 1/2 OF A TABLET (4 MG TOTAL) BY MOUTH DAILY.   finasteride (PROSCAR) 5 MG tablet TAKE 1 TABLET BY MOUTH EVERY DAY   hydrOXYzine (ATARAX/VISTARIL) 25 MG tablet Take 1 tablet (25 mg total) by mouth 2 (two) times daily as needed for itching.   latanoprost (XALATAN) 0.005 % ophthalmic solution Place 1 drop into both eyes at bedtime.    levothyroxine (SYNTHROID) 137 MCG tablet Take 1 tablet (137 mcg total) by mouth daily before breakfast.   meloxicam (MOBIC) 7.5 MG tablet Take 1 tablet (7.5 mg  total) by mouth daily as needed.   Multiple Vitamin (MULTIVITAMIN) tablet Take 1 tablet by mouth daily.   Ascorbic Acid (VITAMIN C) 100 MG tablet Take by mouth daily. Dose unsure   Cholecalciferol (VITAMIN D3) 10 MCG (400 UNIT) tablet Take by mouth daily. Unsure dose   colchicine 0.6 MG tablet TAKE 1 TABLET BY MOUTH EVERY DAY AS NEEDED FOR GOUT   irbesartan (AVAPRO) 150 MG tablet Take 1 tablet (150 mg total) by mouth daily.    Needles & Syringes MISC Use one each month for vitamin b12 injection   sildenafil (REVATIO) 20 MG tablet Take 3 to 5 tablets two hours before intercouse on an empty stomach.  Do not take with nitrates.   No facility-administered encounter medications on file as of 01/14/2021.    Patient Active Problem List   Diagnosis Date Noted   Hyperuricemia 03/08/2020   Senile nuclear sclerosis, bilateral 02/02/2020   Tinea corporis 07/22/2016   Elevated PSA 07/13/2015   Allergic rhinitis 03/08/2015   BPH (benign prostatic hyperplasia) 03/08/2015   Chronic kidney disease (CKD), stage III (moderate) ([place]) 03/08/2015   Essential (primary) hypertension 03/08/2015   Gouty arthritis 03/08/2015   H/O alcohol abuse 03/08/2015   Hyperlipidemia 03/08/2015   Hypothyroid 03/08/2015   Primary open angle glaucoma of both eyes, severe stage 03/08/2015   Osteopenia 03/08/2015   Pernicious anemia 03/08/2015   Pruritus 03/08/2015   Psoriasiform dermatitis 04/18/2013   Other long term (current) drug therapy 03/15/2013   Polypharmacy 03/15/2013   Dermatophytosis of groin 08/02/2012   Dermatitis, eczematoid 05/17/2012      PLAN: Patient requested medication refills. [facility]ed [facility] and reviewed medications. Per technician, patient has prescriptions/refills that are ready for pick up. Pharmacy staff will fax request to the clinic when new orders are required.  No further outreach required.   Cristy [facility] Care Management [facility] [phone]

## 2021-01-22 ENCOUNTER — Ambulatory Visit: Payer: Self-pay | Admitting: Urology

## 2021-01-22 ENCOUNTER — Other Ambulatory Visit: Payer: Self-pay | Admitting: Family Medicine

## 2021-01-22 DIAGNOSIS — M7062 Trochanteric bursitis, left hip: Secondary | ICD-10-CM

## 2021-01-22 DIAGNOSIS — M7061 Trochanteric bursitis, right hip: Secondary | ICD-10-CM

## 2021-01-26 ENCOUNTER — Other Ambulatory Visit: Payer: Self-pay | Admitting: Family Medicine

## 2021-01-26 NOTE — Telephone Encounter (Signed)
Requested Prescriptions  Pending Prescriptions Disp Refills  . doxazosin (CARDURA) 8 MG tablet [Pharmacy Med Name: DOXAZOSIN MESYLATE 8 MG TAB] 45 tablet 0    Sig: TAKE 1/2 OF A TABLET (4 MG TOTAL) BY MOUTH DAILY.     Cardiovascular:  Alpha Blockers Passed - 01/26/2021 10:38 AM      Passed - Last BP in normal range    BP Readings from Last 1 Encounters:  10/22/20 135/68         Passed - Valid encounter within last 6 months    Recent Outpatient Visits          4 months ago Trochanteric bursitis of both hips   [facility] Birdie Sons, MD   8 months ago Essential (primary) hypertension   [facility] Birdie Sons, MD   10 months ago Hyperuricemia   [facility] Birdie Sons, MD   1 year ago Essential (primary) hypertension   [facility] Birdie Sons, MD   1 year ago Need for influenza vaccination   [facility] Birdie Sons, MD      Future Appointments            In 6 months McGowan, Gordan Payment [facility]

## 2021-02-04 DIAGNOSIS — D510 Vitamin B12 deficiency anemia due to intrinsic factor deficiency: Secondary | ICD-10-CM | POA: Diagnosis not present

## 2021-02-04 DIAGNOSIS — I10 Essential (primary) hypertension: Secondary | ICD-10-CM | POA: Diagnosis not present

## 2021-02-04 DIAGNOSIS — N1831 Chronic kidney disease, stage 3a: Secondary | ICD-10-CM | POA: Diagnosis not present

## 2021-02-04 DIAGNOSIS — E039 Hypothyroidism, unspecified: Secondary | ICD-10-CM | POA: Diagnosis not present

## 2021-02-04 DIAGNOSIS — M109 Gout, unspecified: Secondary | ICD-10-CM | POA: Diagnosis not present

## 2021-02-08 ENCOUNTER — Other Ambulatory Visit: Payer: Self-pay | Admitting: Family Medicine

## 2021-02-08 NOTE — Telephone Encounter (Signed)
Requested medication (s) are due for refill today: no  Requested medication (s) are on the active medication list: yes  Last refill:  09/09/20 #90 1 RF  Future visit scheduled: no  Notes to clinic:  TSH not rechecked in July- level was abnormal   Requested Prescriptions  Pending Prescriptions Disp Refills   levothyroxine (SYNTHROID) 137 MCG tablet [Pharmacy Med Name: LEVOTHYROXINE 137 MCG TABLET] 90 tablet 1    Sig: TAKE 1 TABLET BY MOUTH DAILY BEFORE BREAKFAST.     Endocrinology:  Hypothyroid Agents Failed - 02/08/2021  2:36 PM      Failed - TSH needs to be rechecked within 3 months after an abnormal result. Refill until TSH is due.      Failed - TSH in normal range and within 360 days    TSH  Date Value Ref Range Status  09/06/2020 0.145 (L) 0.450 - 4.500 uIU/mL Final          Passed - Valid encounter within last 12 months    Recent Outpatient Visits           5 months ago Trochanteric bursitis of both hips   [facility] Birdie Sons, MD   8 months ago Essential (primary) hypertension   [place] Birdie Sons, MD   11 months ago Hyperuricemia   [facility] Birdie Sons, MD   1 year ago Essential (primary) hypertension   [facility] Birdie Sons, MD   1 year ago Need for influenza vaccination   [facility] Birdie Sons, MD       Future Appointments             In 5 months McGowan, Gordan Payment [facility]

## 2021-02-26 DIAGNOSIS — H401133 Primary open-angle glaucoma, bilateral, severe stage: Secondary | ICD-10-CM | POA: Diagnosis not present

## 2021-02-27 DIAGNOSIS — H26492 Other secondary cataract, left eye: Secondary | ICD-10-CM | POA: Diagnosis not present

## 2021-03-11 DIAGNOSIS — D510 Vitamin B12 deficiency anemia due to intrinsic factor deficiency: Secondary | ICD-10-CM | POA: Diagnosis not present

## 2021-03-11 DIAGNOSIS — I10 Essential (primary) hypertension: Secondary | ICD-10-CM | POA: Diagnosis not present

## 2021-03-11 DIAGNOSIS — Z0000 Encounter for general adult medical examination without abnormal findings: Secondary | ICD-10-CM | POA: Diagnosis not present

## 2021-03-11 DIAGNOSIS — E039 Hypothyroidism, unspecified: Secondary | ICD-10-CM | POA: Diagnosis not present

## 2021-03-11 DIAGNOSIS — M109 Gout, unspecified: Secondary | ICD-10-CM | POA: Diagnosis not present

## 2021-03-11 DIAGNOSIS — N1831 Chronic kidney disease, stage 3a: Secondary | ICD-10-CM | POA: Diagnosis not present

## 2021-03-27 DIAGNOSIS — L219 Seborrheic dermatitis, unspecified: Secondary | ICD-10-CM | POA: Diagnosis not present

## 2021-03-27 DIAGNOSIS — L299 Pruritus, unspecified: Secondary | ICD-10-CM | POA: Diagnosis not present

## 2021-03-27 DIAGNOSIS — L570 Actinic keratosis: Secondary | ICD-10-CM | POA: Diagnosis not present

## 2021-03-27 DIAGNOSIS — L821 Other seborrheic keratosis: Secondary | ICD-10-CM | POA: Diagnosis not present

## 2021-03-27 DIAGNOSIS — L2081 Atopic neurodermatitis: Secondary | ICD-10-CM | POA: Diagnosis not present

## 2021-04-14 DIAGNOSIS — I10 Essential (primary) hypertension: Secondary | ICD-10-CM | POA: Diagnosis not present

## 2021-04-14 DIAGNOSIS — D510 Vitamin B12 deficiency anemia due to intrinsic factor deficiency: Secondary | ICD-10-CM | POA: Diagnosis not present

## 2021-04-14 DIAGNOSIS — E039 Hypothyroidism, unspecified: Secondary | ICD-10-CM | POA: Diagnosis not present

## 2021-04-14 DIAGNOSIS — N1831 Chronic kidney disease, stage 3a: Secondary | ICD-10-CM | POA: Diagnosis not present

## 2021-04-14 DIAGNOSIS — M109 Gout, unspecified: Secondary | ICD-10-CM | POA: Diagnosis not present

## 2021-04-27 ENCOUNTER — Other Ambulatory Visit: Payer: Self-pay | Admitting: Family Medicine

## 2021-06-02 ENCOUNTER — Other Ambulatory Visit: Payer: Self-pay | Admitting: Family Medicine

## 2021-06-11 DIAGNOSIS — I10 Essential (primary) hypertension: Secondary | ICD-10-CM | POA: Diagnosis not present

## 2021-06-11 DIAGNOSIS — E039 Hypothyroidism, unspecified: Secondary | ICD-10-CM | POA: Diagnosis not present

## 2021-06-11 DIAGNOSIS — D510 Vitamin B12 deficiency anemia due to intrinsic factor deficiency: Secondary | ICD-10-CM | POA: Diagnosis not present

## 2021-06-11 DIAGNOSIS — M109 Gout, unspecified: Secondary | ICD-10-CM | POA: Diagnosis not present

## 2021-06-11 DIAGNOSIS — N1831 Chronic kidney disease, stage 3a: Secondary | ICD-10-CM | POA: Diagnosis not present

## 2021-06-20 ENCOUNTER — Other Ambulatory Visit: Payer: Self-pay | Admitting: Urology

## 2021-07-02 DIAGNOSIS — H401133 Primary open-angle glaucoma, bilateral, severe stage: Secondary | ICD-10-CM | POA: Diagnosis not present

## 2021-07-08 DIAGNOSIS — M9901 Segmental and somatic dysfunction of cervical region: Secondary | ICD-10-CM | POA: Diagnosis not present

## 2021-07-08 DIAGNOSIS — M542 Cervicalgia: Secondary | ICD-10-CM | POA: Diagnosis not present

## 2021-07-08 DIAGNOSIS — M62838 Other muscle spasm: Secondary | ICD-10-CM | POA: Diagnosis not present

## 2021-07-10 DIAGNOSIS — M9901 Segmental and somatic dysfunction of cervical region: Secondary | ICD-10-CM | POA: Diagnosis not present

## 2021-07-10 DIAGNOSIS — M62838 Other muscle spasm: Secondary | ICD-10-CM | POA: Diagnosis not present

## 2021-07-10 DIAGNOSIS — M542 Cervicalgia: Secondary | ICD-10-CM | POA: Diagnosis not present

## 2021-07-23 DIAGNOSIS — M25561 Pain in right knee: Secondary | ICD-10-CM | POA: Diagnosis not present

## 2021-07-23 DIAGNOSIS — M109 Gout, unspecified: Secondary | ICD-10-CM | POA: Diagnosis not present

## 2021-07-23 DIAGNOSIS — M25562 Pain in left knee: Secondary | ICD-10-CM | POA: Diagnosis not present

## 2021-07-25 DIAGNOSIS — M25561 Pain in right knee: Secondary | ICD-10-CM | POA: Diagnosis not present

## 2021-07-25 DIAGNOSIS — M25562 Pain in left knee: Secondary | ICD-10-CM | POA: Diagnosis not present

## 2021-07-25 DIAGNOSIS — M25462 Effusion, left knee: Secondary | ICD-10-CM | POA: Diagnosis not present

## 2021-07-30 NOTE — Progress Notes (Unsigned)
07/31/2021 8:49 PM   Siddharth Darlyn Read Sr. 27-Jan-1934 [id number]  Referring provider: Birdie Sons, [place],  [place] [address]  No chief complaint on file.  Urological history: 1. Elevated PSA -PSA Trend 6.0 (12) in 2012 - bx negative - on finasteride             3.6 (7.2) in 09/2011             5.4 (10.8) in 04/2012             3.2 (6.4) in 09/2012             3.6 (7.2) in 04/2013             10.2 in 07/2015 - no finasteride             9.4 in 07/2015 - on finasteride             6.4 in 07/2015 - no finasteride             5.0 in 11/2015             7.7 in 11/2016             9.3 in 11/2017             7.4 in 04/2018 - no finasteride             7.3 in 07/2018 - no finasteride, patient had to stop 06/2018 due to itching; will no longer attempt finasteride             9.3 in 11/2018             12/2018 prostate MRI negative for high grade lesions  PSAD 0.138             7.9 in 07/2019 - restarted the finasteride 5 mg on 05/10/2019             7.9 in 07/2019              3.4 (6.8) in 12/2019             2.9 (5.8) in 07/2020  2. BPH with LU TS -I PSS *** -managed with doxazosin 4 mg daily and finasteride 5 mg daily   3. ED -contributing factors of age, HTN, hypothyroidism, BPH, CKD, HLD, history of alcoholism and former smoking -not sexually active    HPI: Annette ELYE HARMSEN Sr. is a 88 y.o. male for a yearly follow up.        Score:  1-7 Mild 8-19 Moderate 20-35 Severe   PMH: Past Medical History:  Diagnosis Date   Arthritis    right hand   BPH (benign prostatic hyperplasia)    CKD (chronic kidney disease), stage III ([place])    COVID-19 05/2020   ED (erectile dysfunction)    Elevated PSA    GERD (gastroesophageal reflux disease)    occasional   Glaucoma    Gout    Gout    History of alcoholism ([place])    History of chicken pox    History of measles    History of mumps    History of pernicious anemia    HTN (hypertension)     controlled   Hypothyroidism    Wears partial dentures    upper and lower     Surgical History: Past Surgical History:  Procedure Laterality Date   CATARACT EXTRACTION W/PHACO Right 10/08/2020   Procedure:  CATARACT EXTRACTION PHACO AND INTRAOCULAR LENS PLACEMENT ([facility]) RIGHT OMIDRIA;  Surgeon: Birder Robson, MD;  Location: [facility];  Service: Ophthalmology;  Laterality: Right;  CDE 27.87 02:08.6 minutes   CATARACT EXTRACTION W/PHACO Left 10/22/2020   Procedure: CATARACT EXTRACTION PHACO AND INTRAOCULAR LENS PLACEMENT ([facility]) LEFT;  Surgeon: Birder Robson, MD;  Location: [facility];  Service: Ophthalmology;  Laterality: Left;  7.97 00:50.1   EYE SURGERY Right 10/24/2019   laser eye surgery on right eye for glaucoma   PARATHYROIDECTOMY  2004   PHOTOCOAGULATION WITH LASER Right 10/07/2015   Procedure: PHOTOCOAGULATION WITH LASER RIGHT EYE TRANS SCLERAL;  Surgeon: Ronnell Freshwater, MD;  Location: [facility];  Service: Ophthalmology;  Laterality: Right;   TONSILLECTOMY      Home Medications:  Allergies as of 07/31/2021       Reactions   Krystexxa  [pegloticase] Anaphylaxis   Allopurinol    Abdominal pain and itching   Ciprofloxacin    causes mouth to itch   Levofloxacin Other (See Comments)   Tendon pain   Mycophenolate Mofetil    Niacin    Other reaction(s): UNKNOWN   Shellfish Allergy    agrivates gout   Sulfa Antibiotics Itching   Uloric  [febuxostat]    Other reaction(s): Abdominal pain   Finasteride Itching        Medication List        Accurate as of July 30, 2021  8:49 PM. If you have any questions, ask your nurse or doctor.          allopurinol 100 MG tablet Commonly known as: ZYLOPRIM TAKE 1 TABLET BY MOUTH TWICE A DAY   allopurinol 100 MG tablet Commonly known as: ZYLOPRIM Take 1 tablet by mouth 2 (two) times daily.   brimonidine 0.2 % ophthalmic solution Commonly known as: ALPHAGAN Place 1 drop into both  eyes 2 (two) times daily.   clobetasol 0.05 % external solution Commonly known as: TEMOVATE PLEASE SEE ATTACHED FOR DETAILED DIRECTIONS   colchicine 0.6 MG tablet TAKE 1 TABLET BY MOUTH EVERY DAY AS NEEDED FOR GOUT   cyanocobalamin 1000 MCG/ML injection Commonly known as: (VITAMIN B-12) INJECT 1 ML (1,000 MCG TOTAL) INTO THE MUSCLE EVERY 30 (THIRTY) DAYS.   dorzolamide-timolol 22.3-6.8 MG/ML ophthalmic solution Commonly known as: COSOPT Place 1 drop into both eyes 2 (two) times daily.   doxazosin 8 MG tablet Commonly known as: CARDURA TAKE 1/2 OF A TABLET (4 MG TOTAL) BY MOUTH DAILY.   doxazosin 4 MG tablet Commonly known as: CARDURA Take 4 mg by mouth at bedtime.   finasteride 5 MG tablet Commonly known as: PROSCAR Take 1 tablet by mouth daily.   finasteride 5 MG tablet Commonly known as: PROSCAR TAKE 1 TABLET BY MOUTH EVERY DAY   Fluzone High-Dose Quadrivalent 0.7 ML Susy Generic drug: Influenza Vac High-Dose Quad   hydrOXYzine 25 MG tablet Commonly known as: ATARAX Take 1 tablet (25 mg total) by mouth 2 (two) times daily as needed for itching.   hydrOXYzine 25 MG tablet Commonly known as: ATARAX Take by mouth.   irbesartan 150 MG tablet Commonly known as: AVAPRO Take 1 tablet (150 mg total) by mouth daily.   irbesartan 300 MG tablet Commonly known as: AVAPRO Take 300 mg by mouth daily.   latanoprost 0.005 % ophthalmic solution Commonly known as: XALATAN Place 1 drop into both eyes at bedtime.   levothyroxine 137 MCG tablet Commonly known as: SYNTHROID Take 1 tablet (137 mcg total)  by mouth daily before breakfast.   levothyroxine 125 MCG tablet Commonly known as: SYNTHROID Take by mouth.   meloxicam 7.5 MG tablet Commonly known as: MOBIC TAKE 1 TABLET BY MOUTH DAILY AS NEEDED.   multivitamin tablet Take 1 tablet by mouth daily.   Needles & Syringes Misc Use one each month for vitamin b12 injection   sildenafil 20 MG tablet Commonly known  as: REVATIO Take 3 to 5 tablets two hours before intercouse on an empty stomach.  Do not take with nitrates.   sildenafil 50 MG tablet Commonly known as: VIAGRA Take by mouth.   vitamin C 100 MG tablet Take by mouth daily. Dose unsure   Vitamin D3 10 MCG (400 UNIT) tablet Take by mouth daily. Unsure dose        Allergies:  Allergies  Allergen Reactions   Krystexxa  [Pegloticase] Anaphylaxis   Allopurinol     Abdominal pain and itching   Ciprofloxacin     causes mouth to itch   Levofloxacin Other (See Comments)    Tendon pain   Mycophenolate Mofetil    Niacin     Other reaction(s): UNKNOWN   Shellfish Allergy     agrivates gout   Sulfa Antibiotics Itching   Uloric  [Febuxostat]     Other reaction(s): Abdominal pain   Finasteride Itching    Family History: Family History  Problem Relation Age of Onset   Asthma Mother    Heart attack Father    Parkinson's disease Father    Kidney disease Neg Hx    Prostate cancer Neg Hx        maybe paternal grandfather ? unsure    Social History:  reports that he quit smoking about 43 years ago. His smoking use included cigarettes. He has a 30.00 pack-year smoking history. He has never used smokeless tobacco. He reports that he does not drink alcohol and does not use drugs.  ROS: For pertinent review of systems please refer to history of present illness  Physical Exam: There were no vitals taken for this visit.  Constitutional:  Well nourished. Alert and oriented, No acute distress. HEENT: [place] AT, moist mucus membranes.  Trachea midline Cardiovascular: No clubbing, cyanosis, or edema. Respiratory: Normal respiratory effort, no increased work of breathing. GU: No CVA tenderness.  No bladder fullness or masses.  Patient with circumcised/uncircumcised phallus. ***Foreskin easily retracted***  Urethral meatus is patent.  No penile discharge. No penile lesions or rashes. Scrotum without lesions, cysts, rashes and/or edema.   Testicles are located scrotally bilaterally. No masses are appreciated in the testicles. Left and right epididymis are normal. Rectal: Patient with  normal sphincter tone. Anus and perineum without scarring or rashes. No rectal masses are appreciated. Prostate is approximately *** grams, *** nodules are appreciated. Seminal vesicles are normal. Neurologic: Grossly intact, no focal deficits, moving all 4 extremities. Psychiatric: Normal mood and affect.   Laboratory Data:  Glucose 70 - 110 mg/dL 87   Sodium 136 - 145 mmol/L 142   Potassium 3.6 - 5.1 mmol/L 4.5   Chloride 97 - 109 mmol/L 106   Carbon Dioxide (CO2) 22.0 - 32.0 mmol/L 29.1   Calcium 8.7 - 10.3 mg/dL 9.2   Urea Nitrogen (BUN) 7 - 25 mg/dL 26 High    Creatinine 0.7 - 1.3 mg/dL 1.6 High    Glomerular Filtration Rate (eGFR), MDRD Estimate >60 mL/min/1.73sq m 41 Low    BUN/Crea Ratio 6.0 - 20.0 16.3   Anion Gap w/K 6.0 -  16.0 11.4   Resulting Agency  [facility] - [facility]  Specimen Collected: 06/11/21 11:41 Last Resulted: 06/11/21 14:52  Received From: [facility]  Result Received: 06/20/21 15:41   Uric Acid 4.4 - 7.6 mg/dL 8.1 High    Resulting Agency  [facility] - [facility]  Specimen Collected: 06/11/21 11:41 Last Resulted: 06/11/21 14:52  Received From: [facility]  Result Received: 06/20/21 15:41   Thyroid Stimulating Hormone (TSH) 0.450-5.330 uIU/ml uIU/mL 1.119   Resulting Agency  [facility] - [facility]  Specimen Collected: 06/11/21 11:41 Last Resulted: 06/11/21 14:14  Received From: [facility]  Result Received: 06/20/21 15:41   Cholesterol, Total 100 - 200 mg/dL 172   Triglyceride 35 - 199 mg/dL 77   HDL (High Density Lipoprotein) Cholesterol 29.0 - 71.0 mg/dL 41.3   LDL Calculated 0 - 130 mg/dL 115   VLDL Cholesterol mg/dL 15   Cholesterol/HDL Ratio  4.2   Resulting Agency  [facility] - [facility]  Specimen Collected: 04/14/21 09:25 Last  Resulted: 04/14/21 14:14  Received From: [facility]  Result Received: 04/28/21 07:38   WBC (White Blood Cell Count) 4.1 - 10.2 103/uL 8.4   RBC (Red Blood Cell Count) 4.69 - 6.13 106/uL 4.59 Low    Hemoglobin 14.1 - 18.1 gm/dL 13.7 Low    Hematocrit 40.0 - 52.0 % 41.6   MCV (Mean Corpuscular Volume) 80.0 - 100.0 fl 90.6   MCH (Mean Corpuscular Hemoglobin) 27.0 - 31.2 pg 29.8   MCHC (Mean Corpuscular Hemoglobin Concentration) 32.0 - 36.0 gm/dL 32.9   Platelet Count 150 - 450 103/uL 216   RDW-CV (Red Cell Distribution Width) 11.6 - 14.8 % 13.7   MPV (Mean Platelet Volume) 9.4 - 12.4 fl 11.1   Neutrophils 1.50 - 7.80 103/uL 4.70   Lymphocytes 1.00 - 3.60 103/uL 2.54   Monocytes 0.00 - 1.50 103/uL 0.64   Eosinophils 0.00 - 0.55 103/uL 0.45   Basophils 0.00 - 0.09 103/uL 0.03   Neutrophil % 32.0 - 70.0 % 56.2   Lymphocyte % 10.0 - 50.0 % 30.3   Monocyte % 4.0 - 13.0 % 7.6   Eosinophil % 1.0 - 5.0 % 5.4 High    Basophil% 0.0 - 2.0 % 0.4   Immature Granulocyte % <=0.7 % 0.1   Immature Granulocyte Count <=0.06 10^3/L 0.01   Resulting Agency  [facility] - [facility]  Specimen Collected: 02/04/21 11:58 Last Resulted: 02/04/21 13:49  Received From: [facility]  Result Received: 04/28/21 07:38  I have reviewed the labs.  Pertinent Imaging ***  Assessment & Plan:    1. Elevated PSA - neg prostate MRI 12/2018 - PSAD 0.138 - PSA pending  2. BPH with LUTS -PSA pending -DRE benign -PVR < 300 cc -symptoms - *** -most bothersome symptoms are *** -continue conservative management, avoiding bladder irritants and timed voiding's -Continue doxazosin 4 mg daily and finasteride 5 mg daily-refills given  3. Erectile dysfunction:  - not sexually active at this time  No follow-ups on file.  Zara Council, PA-C  [facility] [address] [place], [place] [address] [phone]

## 2021-07-31 ENCOUNTER — Encounter: Payer: Self-pay | Admitting: Urology

## 2021-07-31 ENCOUNTER — Ambulatory Visit: Payer: Self-pay | Admitting: Urology

## 2021-07-31 DIAGNOSIS — R972 Elevated prostate specific antigen [PSA]: Secondary | ICD-10-CM

## 2021-07-31 DIAGNOSIS — N529 Male erectile dysfunction, unspecified: Secondary | ICD-10-CM

## 2021-07-31 DIAGNOSIS — N401 Enlarged prostate with lower urinary tract symptoms: Secondary | ICD-10-CM

## 2021-08-06 ENCOUNTER — Other Ambulatory Visit
Admission: RE | Admit: 2021-08-06 | Discharge: 2021-08-06 | Disposition: A | Discharge status: Home / Self Care | Payer: PPO | Source: Ambulatory Visit | Attending: Rheumatology | Admitting: Rheumatology

## 2021-08-06 DIAGNOSIS — M25561 Pain in right knee: Secondary | ICD-10-CM | POA: Diagnosis not present

## 2021-08-06 DIAGNOSIS — M109 Gout, unspecified: Secondary | ICD-10-CM | POA: Insufficient documentation

## 2021-08-06 LAB — SYNOVIAL CELL COUNT + DIFF, W/ CRYSTALS
Eosinophils-Synovial: 0 %
Lymphocytes-Synovial Fld: 5 %
Monocyte-Macrophage-Synovial Fluid: 17 %
Neutrophil, Synovial: 78 %
WBC, Synovial: 8085 /mm3 — ABNORMAL HIGH (ref 0–200)

## 2021-08-23 ENCOUNTER — Other Ambulatory Visit: Payer: Self-pay | Admitting: Family Medicine

## 2021-12-10 DIAGNOSIS — E039 Hypothyroidism, unspecified: Secondary | ICD-10-CM | POA: Diagnosis not present

## 2021-12-10 DIAGNOSIS — I10 Essential (primary) hypertension: Secondary | ICD-10-CM | POA: Diagnosis not present

## 2021-12-10 DIAGNOSIS — Z0000 Encounter for general adult medical examination without abnormal findings: Secondary | ICD-10-CM | POA: Diagnosis not present

## 2021-12-10 DIAGNOSIS — M109 Gout, unspecified: Secondary | ICD-10-CM | POA: Diagnosis not present

## 2021-12-10 DIAGNOSIS — D510 Vitamin B12 deficiency anemia due to intrinsic factor deficiency: Secondary | ICD-10-CM | POA: Diagnosis not present

## 2021-12-10 DIAGNOSIS — N1831 Chronic kidney disease, stage 3a: Secondary | ICD-10-CM | POA: Diagnosis not present

## 2022-01-26 ENCOUNTER — Ambulatory Visit: Payer: Self-pay

## 2022-01-26 NOTE — Chronic Care Management (AMB) (Signed)
   01/26/2022  Jacob Najjar Sr. [birth year] [id number]   Documentation encounter created to complete case transition. Per chart review, patient has established care with new PCP. The care management team will assist with care coordination if needed.   [facility] [phone]

## 2022-02-23 DIAGNOSIS — H401133 Primary open-angle glaucoma, bilateral, severe stage: Secondary | ICD-10-CM | POA: Diagnosis not present

## 2022-03-24 DIAGNOSIS — Z23 Encounter for immunization: Secondary | ICD-10-CM | POA: Diagnosis not present

## 2022-06-12 DIAGNOSIS — M109 Gout, unspecified: Secondary | ICD-10-CM | POA: Diagnosis not present

## 2022-06-12 DIAGNOSIS — N1831 Chronic kidney disease, stage 3a: Secondary | ICD-10-CM | POA: Diagnosis not present

## 2022-06-12 DIAGNOSIS — I10 Essential (primary) hypertension: Secondary | ICD-10-CM | POA: Diagnosis not present

## 2022-06-12 DIAGNOSIS — Z0000 Encounter for general adult medical examination without abnormal findings: Secondary | ICD-10-CM | POA: Diagnosis not present

## 2022-06-12 DIAGNOSIS — D510 Vitamin B12 deficiency anemia due to intrinsic factor deficiency: Secondary | ICD-10-CM | POA: Diagnosis not present

## 2022-06-12 DIAGNOSIS — R251 Tremor, unspecified: Secondary | ICD-10-CM | POA: Diagnosis not present

## 2022-06-12 DIAGNOSIS — E039 Hypothyroidism, unspecified: Secondary | ICD-10-CM | POA: Diagnosis not present

## 2022-07-16 DIAGNOSIS — D229 Melanocytic nevi, unspecified: Secondary | ICD-10-CM | POA: Diagnosis not present

## 2022-07-16 DIAGNOSIS — L820 Inflamed seborrheic keratosis: Secondary | ICD-10-CM | POA: Diagnosis not present

## 2022-07-16 DIAGNOSIS — L821 Other seborrheic keratosis: Secondary | ICD-10-CM | POA: Diagnosis not present

## 2022-07-16 DIAGNOSIS — L578 Other skin changes due to chronic exposure to nonionizing radiation: Secondary | ICD-10-CM | POA: Diagnosis not present

## 2022-07-16 DIAGNOSIS — L814 Other melanin hyperpigmentation: Secondary | ICD-10-CM | POA: Diagnosis not present

## 2022-07-16 DIAGNOSIS — L570 Actinic keratosis: Secondary | ICD-10-CM | POA: Diagnosis not present

## 2022-07-30 NOTE — Progress Notes (Unsigned)
07/31/2022 9:29 AM   Jacob Darlyn Read Sr. 1933/09/21 [id number]  Referring provider: Leonel Ramsay, MD [place],  [place] [address]  Urological history 1.  Elevated PSA ***  2. BP with LU TS -I PSS *** -Doxazosin 4 mg daily finasteride 5 mg daily   Chief Complaint  Patient presents with   Follow-up   Elevated PSA    1 year follow-up    HPI: Jacob Nielsen. is a 89 y.o. male  who presents today for follow up.   IPSS     Row Name 07/31/22 0900         International Prostate Symptom Score   How often have you had the sensation of not emptying your bladder? Less than half the time     How often have you had to urinate less than every two hours? Less than 1 in 5 times     How often have you found you stopped and started again several times when you urinated? More than half the time     How often have you found it difficult to postpone urination? More than half the time     How often have you had a weak urinary stream? Less than 1 in 5 times     How often have you had to strain to start urination? Less than 1 in 5 times     How many times did you typically get up at night to urinate? 1 Time     Total IPSS Score 14       Quality of Life due to urinary symptoms   If you were to spend the rest of your life with your urinary condition just the way it is now how would you feel about that? Mostly Satisfied              Score:  1-7 Mild 8-19 Moderate 20-35 Severe   PMH: Past Medical History:  Diagnosis Date   Arthritis    right hand   BPH (benign prostatic hyperplasia)    CKD (chronic kidney disease), stage III ([place])    COVID-19 05/2020   ED (erectile dysfunction)    Elevated PSA    GERD (gastroesophageal reflux disease)    occasional   Glaucoma    Gout    Gout    History of alcoholism ([place])    History of chicken pox    History of measles    History of mumps    History of pernicious anemia    HTN (hypertension)    controlled    Hypothyroidism    Wears partial dentures    upper and lower     Surgical History: Past Surgical History:  Procedure Laterality Date   CATARACT EXTRACTION W/PHACO Right 10/08/2020   Procedure: CATARACT EXTRACTION PHACO AND INTRAOCULAR LENS PLACEMENT ([facility]) RIGHT OMIDRIA;  Surgeon: Birder Robson, MD;  Location: [facility];  Service: Ophthalmology;  Laterality: Right;  CDE 27.87 02:08.6 minutes   CATARACT EXTRACTION W/PHACO Left 10/22/2020   Procedure: CATARACT EXTRACTION PHACO AND INTRAOCULAR LENS PLACEMENT ([facility]) LEFT;  Surgeon: Birder Robson, MD;  Location: [facility];  Service: Ophthalmology;  Laterality: Left;  7.97 00:50.1   EYE SURGERY Right 10/24/2019   laser eye surgery on right eye for glaucoma   PARATHYROIDECTOMY  2004   PHOTOCOAGULATION WITH LASER Right 10/07/2015   Procedure: PHOTOCOAGULATION WITH LASER RIGHT EYE TRANS SCLERAL;  Surgeon: Ronnell Freshwater, MD;  Location: [facility];  Service: Ophthalmology;  Laterality: Right;   TONSILLECTOMY      Home Medications:  Allergies as of 07/31/2022       Reactions   Krystexxa  [pegloticase] Anaphylaxis   Allopurinol    Abdominal pain and itching   Ciprofloxacin    causes mouth to itch   Levofloxacin Other (See Comments)   Tendon pain   Mycophenolate Mofetil    Niacin    Other reaction(s): UNKNOWN   Shellfish Allergy    agrivates gout   Sulfa Antibiotics Itching   Uloric  [febuxostat]    Other reaction(s): Abdominal pain   Finasteride Itching        Medication List        Accurate as of July 31, 2022  9:29 AM. If you have any questions, ask your nurse or doctor.          STOP taking these medications    clobetasol 0.05 % external solution Commonly known as: TEMOVATE Stopped by: Jarin Cornfield, PA-C   Fluzone High-Dose Quadrivalent 0.7 ML Susy Generic drug: Influenza Vac High-Dose Quad Stopped by: Zara Council, PA-C       TAKE these medications     allopurinol 100 MG tablet Commonly known as: ZYLOPRIM Take 1 tablet by mouth 2 (two) times daily. What changed: Another medication with the same name was removed. Continue taking this medication, and follow the directions you see here. Changed by: Zara Council, PA-C   brimonidine 0.2 % ophthalmic solution Commonly known as: ALPHAGAN Place 1 drop into both eyes 2 (two) times daily.   colchicine 0.6 MG tablet TAKE 1 TABLET BY MOUTH EVERY DAY AS NEEDED FOR GOUT   cyanocobalamin 1000 MCG/ML injection Commonly known as: VITAMIN B12 INJECT 1 ML (1,000 MCG TOTAL) INTO THE MUSCLE EVERY 30 (THIRTY) DAYS.   dorzolamide-timolol 2-0.5 % ophthalmic solution Commonly known as: COSOPT Place 1 drop into both eyes 2 (two) times daily.   doxazosin 4 MG tablet Commonly known as: CARDURA Take 4 mg by mouth at bedtime. What changed: Another medication with the same name was removed. Continue taking this medication, and follow the directions you see here. Changed by: Zara Council, PA-C   ferrous sulfate 325 (65 FE) MG tablet Take 1 tablet by mouth daily with breakfast.   finasteride 5 MG tablet Commonly known as: PROSCAR TAKE 1 TABLET BY MOUTH EVERY DAY What changed: Another medication with the same name was removed. Continue taking this medication, and follow the directions you see here. Changed by: Zara Council, PA-C   hydrOXYzine 10 MG tablet Commonly known as: ATARAX Take 10 mg by mouth 3 (three) times daily as needed for itching. What changed: Another medication with the same name was removed. Continue taking this medication, and follow the directions you see here. Changed by: Eden Toohey, PA-C   irbesartan 300 MG tablet Commonly known as: AVAPRO Take 300 mg by mouth daily. What changed: Another medication with the same name was removed. Continue taking this medication, and follow the directions you see here. Changed by: Larene Beach [place] Hedman, PA-C   latanoprost 0.005 %  ophthalmic solution Commonly known as: XALATAN Place 1 drop into both eyes at bedtime.   levothyroxine 125 MCG tablet Commonly known as: SYNTHROID Take by mouth. What changed: Another medication with the same name was removed. Continue taking this medication, and follow the directions you see here. Changed by: Zara Council, PA-C   meloxicam 7.5 MG tablet Commonly known as: MOBIC TAKE 1 TABLET BY MOUTH DAILY AS  NEEDED.   multivitamin tablet Take 1 tablet by mouth daily.   Needles & Syringes Misc Use one each month for vitamin b12 injection   sildenafil 20 MG tablet Commonly known as: REVATIO Take 3 to 5 tablets two hours before intercouse on an empty stomach.  Do not take with nitrates.   sildenafil 50 MG tablet Commonly known as: VIAGRA Take by mouth.   vitamin C 100 MG tablet Take by mouth daily. Dose unsure   Vitamin D3 10 MCG (400 UNIT) Tabs tablet Generic drug: cholecalciferol Take by mouth daily. Unsure dose        Allergies:  Allergies  Allergen Reactions   Krystexxa  [Pegloticase] Anaphylaxis   Allopurinol     Abdominal pain and itching   Ciprofloxacin     causes mouth to itch   Levofloxacin Other (See Comments)    Tendon pain   Mycophenolate Mofetil    Niacin     Other reaction(s): UNKNOWN   Shellfish Allergy     agrivates gout   Sulfa Antibiotics Itching   Uloric  [Febuxostat]     Other reaction(s): Abdominal pain   Finasteride Itching    Family History: Family History  Problem Relation Age of Onset   Asthma Mother    Heart attack Father    Parkinson's disease Father    Kidney disease Neg Hx    Prostate cancer Neg Hx        maybe paternal grandfather ? unsure    Social History:  reports that he quit smoking about 44 years ago. His smoking use included cigarettes. He has a 30.00 pack-year smoking history. He has been exposed to tobacco smoke. He has never used smokeless tobacco. He reports that he does not drink alcohol and does not  use drugs.  ROS: For pertinent review of systems please refer to history of present illness  Physical Exam: BP (!) 151/87   Pulse (!) 59   Ht 6' (1.829 m)   Wt 180 lb (81.6 kg)   BMI 24.41 kg/m   Constitutional:  Well nourished. Alert and oriented, No acute distress. HEENT: [place] AT, moist mucus membranes.  Trachea midline Cardiovascular: No clubbing, cyanosis, or edema. Respiratory: Normal respiratory effort, no increased work of breathing. Neurologic: Grossly intact, no focal deficits, moving all 4 extremities. Psychiatric: Normal mood and affect.   Laboratory Data:  Serum creatinine (06/2022) 1.6 TSH (06/2022) 2.942 Uric acid (06/2022) 7.8 I have reviewed the labs.  Assessment & Plan:    1. Elevated PSA ***  2. BPH with LUTS *** - continue the finasteride 5 mg daily and doxazosin 4 mg  3. Erectile dysfunction:  - not sexually active at this time  No follow-ups on file.  [place], [place] [address] [place], [place] [address] [phone]

## 2022-07-31 ENCOUNTER — Encounter: Payer: Self-pay | Admitting: Urology

## 2022-07-31 ENCOUNTER — Ambulatory Visit: Payer: PPO | Admitting: Urology

## 2022-07-31 VITALS — BP 151/87 | HR 59 | Ht 72.0 in | Wt 180.0 lb

## 2022-07-31 DIAGNOSIS — N138 Other obstructive and reflux uropathy: Secondary | ICD-10-CM | POA: Diagnosis not present

## 2022-07-31 DIAGNOSIS — N529 Male erectile dysfunction, unspecified: Secondary | ICD-10-CM | POA: Diagnosis not present

## 2022-07-31 DIAGNOSIS — R972 Elevated prostate specific antigen [PSA]: Secondary | ICD-10-CM

## 2022-07-31 DIAGNOSIS — N401 Enlarged prostate with lower urinary tract symptoms: Secondary | ICD-10-CM | POA: Diagnosis not present

## 2022-07-31 MED ORDER — FINASTERIDE 5 MG PO TABS: 5.0000 mg | ORAL_TABLET | Freq: Every day | ORAL | 3 refills | Status: DC

## 2022-08-01 LAB — PSA: Prostate Specific Ag, Serum: 2.1 ng/mL (ref 0.0–4.0)

## 2022-08-03 ENCOUNTER — Telehealth: Payer: Self-pay | Admitting: Family Medicine

## 2022-08-03 NOTE — Telephone Encounter (Signed)
Patient notified and voiced understanding.

## 2022-08-03 NOTE — Telephone Encounter (Signed)
-----   Message from Nori Riis, PA-C sent at 08/01/2022  4:48 PM EST ----- Please let Mr. Pariso know that his PSA is fine a 2.1 and we will see him next year.

## 2022-08-24 DIAGNOSIS — H401122 Primary open-angle glaucoma, left eye, moderate stage: Secondary | ICD-10-CM | POA: Diagnosis not present

## 2022-08-24 DIAGNOSIS — H35379 Puckering of macula, unspecified eye: Secondary | ICD-10-CM | POA: Diagnosis not present

## 2022-08-24 DIAGNOSIS — H401133 Primary open-angle glaucoma, bilateral, severe stage: Secondary | ICD-10-CM | POA: Diagnosis not present

## 2022-10-20 DIAGNOSIS — J019 Acute sinusitis, unspecified: Secondary | ICD-10-CM | POA: Diagnosis not present

## 2022-12-11 DIAGNOSIS — R918 Other nonspecific abnormal finding of lung field: Secondary | ICD-10-CM | POA: Diagnosis not present

## 2022-12-11 DIAGNOSIS — E039 Hypothyroidism, unspecified: Secondary | ICD-10-CM | POA: Diagnosis not present

## 2022-12-11 DIAGNOSIS — M109 Gout, unspecified: Secondary | ICD-10-CM | POA: Diagnosis not present

## 2022-12-11 DIAGNOSIS — R0609 Other forms of dyspnea: Secondary | ICD-10-CM | POA: Diagnosis not present

## 2022-12-11 DIAGNOSIS — D510 Vitamin B12 deficiency anemia due to intrinsic factor deficiency: Secondary | ICD-10-CM | POA: Diagnosis not present

## 2022-12-11 DIAGNOSIS — R634 Abnormal weight loss: Secondary | ICD-10-CM | POA: Diagnosis not present

## 2022-12-11 DIAGNOSIS — N1831 Chronic kidney disease, stage 3a: Secondary | ICD-10-CM | POA: Diagnosis not present

## 2022-12-11 DIAGNOSIS — I10 Essential (primary) hypertension: Secondary | ICD-10-CM | POA: Diagnosis not present

## 2022-12-24 DIAGNOSIS — M546 Pain in thoracic spine: Secondary | ICD-10-CM | POA: Diagnosis not present

## 2022-12-24 DIAGNOSIS — M9902 Segmental and somatic dysfunction of thoracic region: Secondary | ICD-10-CM | POA: Diagnosis not present

## 2022-12-24 DIAGNOSIS — M25511 Pain in right shoulder: Secondary | ICD-10-CM | POA: Diagnosis not present

## 2022-12-24 DIAGNOSIS — M5413 Radiculopathy, cervicothoracic region: Secondary | ICD-10-CM | POA: Diagnosis not present

## 2022-12-24 DIAGNOSIS — M9901 Segmental and somatic dysfunction of cervical region: Secondary | ICD-10-CM | POA: Diagnosis not present

## 2022-12-28 DIAGNOSIS — M9901 Segmental and somatic dysfunction of cervical region: Secondary | ICD-10-CM | POA: Diagnosis not present

## 2022-12-28 DIAGNOSIS — M546 Pain in thoracic spine: Secondary | ICD-10-CM | POA: Diagnosis not present

## 2022-12-28 DIAGNOSIS — M9902 Segmental and somatic dysfunction of thoracic region: Secondary | ICD-10-CM | POA: Diagnosis not present

## 2022-12-28 DIAGNOSIS — M25511 Pain in right shoulder: Secondary | ICD-10-CM | POA: Diagnosis not present

## 2022-12-28 DIAGNOSIS — M5413 Radiculopathy, cervicothoracic region: Secondary | ICD-10-CM | POA: Diagnosis not present

## 2023-01-14 DIAGNOSIS — H524 Presbyopia: Secondary | ICD-10-CM | POA: Diagnosis not present

## 2023-01-14 DIAGNOSIS — H52203 Unspecified astigmatism, bilateral: Secondary | ICD-10-CM | POA: Diagnosis not present

## 2023-02-16 DIAGNOSIS — M25512 Pain in left shoulder: Secondary | ICD-10-CM | POA: Diagnosis not present

## 2023-02-16 DIAGNOSIS — Z79899 Other long term (current) drug therapy: Secondary | ICD-10-CM | POA: Diagnosis not present

## 2023-02-16 DIAGNOSIS — M109 Gout, unspecified: Secondary | ICD-10-CM | POA: Diagnosis not present

## 2023-02-16 DIAGNOSIS — G8929 Other chronic pain: Secondary | ICD-10-CM | POA: Diagnosis not present

## 2023-02-16 DIAGNOSIS — M25511 Pain in right shoulder: Secondary | ICD-10-CM | POA: Diagnosis not present

## 2023-03-10 DIAGNOSIS — H52203 Unspecified astigmatism, bilateral: Secondary | ICD-10-CM | POA: Diagnosis not present

## 2023-03-10 DIAGNOSIS — H35379 Puckering of macula, unspecified eye: Secondary | ICD-10-CM | POA: Diagnosis not present

## 2023-03-10 DIAGNOSIS — H401133 Primary open-angle glaucoma, bilateral, severe stage: Secondary | ICD-10-CM | POA: Diagnosis not present

## 2023-03-24 DIAGNOSIS — M25511 Pain in right shoulder: Secondary | ICD-10-CM | POA: Diagnosis not present

## 2023-03-24 DIAGNOSIS — G8929 Other chronic pain: Secondary | ICD-10-CM | POA: Diagnosis not present

## 2023-03-24 DIAGNOSIS — Z79899 Other long term (current) drug therapy: Secondary | ICD-10-CM | POA: Diagnosis not present

## 2023-03-24 DIAGNOSIS — M109 Gout, unspecified: Secondary | ICD-10-CM | POA: Diagnosis not present

## 2023-03-24 DIAGNOSIS — M25512 Pain in left shoulder: Secondary | ICD-10-CM | POA: Diagnosis not present

## 2023-04-22 DIAGNOSIS — M109 Gout, unspecified: Secondary | ICD-10-CM | POA: Diagnosis not present

## 2023-04-22 DIAGNOSIS — M150 Primary generalized (osteo)arthritis: Secondary | ICD-10-CM | POA: Diagnosis not present

## 2023-04-22 DIAGNOSIS — Z79899 Other long term (current) drug therapy: Secondary | ICD-10-CM | POA: Diagnosis not present

## 2023-06-15 DIAGNOSIS — N1831 Chronic kidney disease, stage 3a: Secondary | ICD-10-CM | POA: Diagnosis not present

## 2023-06-15 DIAGNOSIS — G8929 Other chronic pain: Secondary | ICD-10-CM | POA: Diagnosis not present

## 2023-06-15 DIAGNOSIS — M25511 Pain in right shoulder: Secondary | ICD-10-CM | POA: Diagnosis not present

## 2023-06-15 DIAGNOSIS — Z0000 Encounter for general adult medical examination without abnormal findings: Secondary | ICD-10-CM | POA: Diagnosis not present

## 2023-06-15 DIAGNOSIS — M109 Gout, unspecified: Secondary | ICD-10-CM | POA: Diagnosis not present

## 2023-06-15 DIAGNOSIS — I10 Essential (primary) hypertension: Secondary | ICD-10-CM | POA: Diagnosis not present

## 2023-06-15 DIAGNOSIS — E538 Deficiency of other specified B group vitamins: Secondary | ICD-10-CM | POA: Diagnosis not present

## 2023-06-15 DIAGNOSIS — E039 Hypothyroidism, unspecified: Secondary | ICD-10-CM | POA: Diagnosis not present

## 2023-06-15 DIAGNOSIS — R0609 Other forms of dyspnea: Secondary | ICD-10-CM | POA: Diagnosis not present

## 2023-06-15 DIAGNOSIS — N1832 Chronic kidney disease, stage 3b: Secondary | ICD-10-CM | POA: Diagnosis not present

## 2023-06-15 DIAGNOSIS — M25512 Pain in left shoulder: Secondary | ICD-10-CM | POA: Diagnosis not present

## 2023-06-30 ENCOUNTER — Ambulatory Visit: Payer: PPO | Attending: Infectious Diseases | Admitting: Physical Therapy

## 2023-06-30 DIAGNOSIS — M25512 Pain in left shoulder: Secondary | ICD-10-CM | POA: Diagnosis not present

## 2023-06-30 DIAGNOSIS — M25511 Pain in right shoulder: Secondary | ICD-10-CM | POA: Diagnosis not present

## 2023-06-30 DIAGNOSIS — G8929 Other chronic pain: Secondary | ICD-10-CM | POA: Insufficient documentation

## 2023-06-30 NOTE — Therapy (Signed)
OUTPATIENT PHYSICAL THERAPY SHOULDER EVALUATION   Patient Name: Jacob FEATHERLY Sr. MRN: [id number] DOB:Jan 20, 1934, 89 y.o., male Today's Date: 06/30/2023  END OF SESSION:  PT End of Session - 06/30/23 0919     Visit Number 1    Number of Visits 20    Date for PT Re-Evaluation 09/08/23    Authorization Type [id number] [id number]    Authorization - Visit Number 1    Authorization - Number of Visits 20    Progress Note Due on Visit 10    PT Start Time 0815    PT Stop Time 0900    PT Time Calculation (min) 45 min    Activity Tolerance Patient limited by pain    Behavior During Therapy WFL for tasks assessed/performed             Past Medical History:  Diagnosis Date   Arthritis    right hand   BPH (benign prostatic hyperplasia)    CKD (chronic kidney disease), stage III (HCC)    COVID-19 05/2020   ED (erectile dysfunction)    Elevated PSA    GERD (gastroesophageal reflux disease)    occasional   Glaucoma    Gout    Gout    History of alcoholism (HCC)    History of chicken pox    History of measles    History of mumps    History of pernicious anemia    HTN (hypertension)    controlled   Hypothyroidism    Wears partial dentures    upper and lower    Past Surgical History:  Procedure Laterality Date   CATARACT EXTRACTION W/PHACO Right 10/08/2020   Procedure: CATARACT EXTRACTION PHACO AND INTRAOCULAR LENS PLACEMENT (IOC) RIGHT OMIDRIA;  Surgeon: Galen Manila, MD;  Location: [facility];  Service: Ophthalmology;  Laterality: Right;  CDE 27.87 02:08.6 minutes   CATARACT EXTRACTION W/PHACO Left 10/22/2020   Procedure: CATARACT EXTRACTION PHACO AND INTRAOCULAR LENS PLACEMENT (IOC) LEFT;  Surgeon: Galen Manila, MD;  Location: [facility];  Service: Ophthalmology;  Laterality: Left;  7.97 00:50.1   EYE SURGERY Right 10/24/2019   laser eye surgery on right eye for glaucoma   PARATHYROIDECTOMY  2004   PHOTOCOAGULATION WITH LASER Right 10/07/2015   Procedure:  PHOTOCOAGULATION WITH LASER RIGHT EYE TRANS SCLERAL;  Surgeon: Sherald Hess, MD;  Location: [facility];  Service: Ophthalmology;  Laterality: Right;   TONSILLECTOMY     Patient Active Problem List   Diagnosis Date Noted   Hyperuricemia 03/08/2020   Senile nuclear sclerosis, bilateral 02/02/2020   Tinea corporis 07/22/2016   Elevated PSA 07/13/2015   Allergic rhinitis 03/08/2015   BPH (benign prostatic hyperplasia) 03/08/2015   Chronic kidney disease (CKD), stage III (moderate) (HCC) 03/08/2015   Essential (primary) hypertension 03/08/2015   Gouty arthritis 03/08/2015   H/O alcohol abuse 03/08/2015   Hyperlipidemia 03/08/2015   Hypothyroid 03/08/2015   Primary open angle glaucoma of both eyes, severe stage 03/08/2015   Osteopenia 03/08/2015   Pernicious anemia 03/08/2015   Pruritus 03/08/2015   Psoriasiform dermatitis 04/18/2013   Other long term (current) drug therapy 03/15/2013   Polypharmacy 03/15/2013   Dermatophytosis of groin 08/02/2012   Dermatitis, eczematoid 05/17/2012    PCP: Dr. Clydie Braun    REFERRING PROVIDER: Dr.   Cindi Carbon DIAG:  [phone] (ICD-10-CM) - Chronic right shoulder pain M25.512,G89.29 (ICD-10-CM) - Chronic left shoulder pain    THERAPY DIAG:  Chronic right shoulder pain  Chronic left shoulder pain  Rationale  for Evaluation and Treatment: Rehabilitation  ONSET DATE: 08/2022   SUBJECTIVE:                                                                                                                                                                                      SUBJECTIVE STATEMENT: See pertinent  Hand dominance: Right  PERTINENT HISTORY: Shoulders started hurting 9 months ago and came on with insidious onset.   Pt reports that he received cortisone shots in both his shoulders and he is now feeling pain again. 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 Pt reports also night  sweats that have been worsening, but he does not feel shoulder pain at night unless he lays on his sides.   PAIN:  Are you having pain? Yes: NPRS scale: 6/10 Pain location: On top of AC joint  Pain description: Achy Aggravating factors: Abducting, flexing and externally rotating overhead  Relieving factors: Keep shoulders down by his side and not laying on his shoulders  PRECAUTIONS: None  RED FLAGS: None   WEIGHT BEARING RESTRICTIONS: No  FALLS:  Has patient fallen in last 6 months? No  LIVING ENVIRONMENT: Lives with: lives with their spouse Lives in: House/apartment Stairs: Yes: Internal: 16 steps; on right going up and External: 2 steps; none Has following equipment at home: None  OCCUPATION: Retired   PLOF: Independent  PATIENT GOALS: Patient wants to return to lifting weight   NEXT MD VISIT: February 14th, 2025   OBJECTIVE:  Note: Objective measures were completed at Evaluation unless otherwise noted.  VITALS BP 141/68 HR 70 SpO2 100%  DIAGNOSTIC FINDINGS:  No imaging listed in charts   PATIENT SURVEYS:  Quick Dash including Sports Module= 28%   COGNITION: Overall cognitive status: Within functional limits for tasks assessed     SENSATION: WFL  POSTURE: Forward head and rounded shoulders   UPPER EXTREMITY ROM:                                                                   Cervical                                AROM  Flex    45                                           Ext      45                        Lat Side Bend R/L 45/45                       Rotation       R/L     60/60  Active ROM Right eval Left eval  Shoulder flexion 160* 160*  Shoulder extension    Shoulder abduction 160* 160*  Shoulder adduction    Shoulder internal rotation 70 70  Shoulder external rotation 90 90  Elbow flexion    Elbow extension    Wrist flexion    Wrist extension    Wrist ulnar deviation    Wrist radial  deviation    Wrist pronation    Wrist supination    (Blank rows = not tested)                                               UPPER EXTREMITY MMT:  MMT Right eval Left eval  Shoulder flexion 4 4  Shoulder extension 4 4  Shoulder abduction 4 4  Shoulder adduction    Shoulder internal rotation 4 4  Shoulder external rotation 4-* 4-*  Middle trapezius 4 4  Lower trapezius 4- 4  Elbow flexion    Elbow extension    Wrist flexion    Wrist extension    Wrist ulnar deviation    Wrist radial deviation    Wrist pronation    Wrist supination    Grip strength (lbs)    (Blank rows = not tested)  SHOULDER SPECIAL TESTS: Impingement tests: Neer impingement test: NT, Hawkins/Kennedy impingement test: NT, and Painful arc test: NT  Rotator cuff assessment: Full can test: positive , Belly press test: negative, and Infraspinatus test: positive  Biceps assessment: Speed's test: negative  JOINT MOBILITY TESTING:  Not tested    PALPATION:  AC joint TTP                                                                                                                              TREATMENT DATE:   Eval (06/30/23) Shoulder Flexion/Extension AAROM 1 x 30  Shoulder Abduction/Adduction AAROM 1 x 30    PATIENT EDUCATION: Education details: Form and technique for correct performance of exercise and explanation of rotator cuff pathology.  Person educated: Patient Education method: Explanation, Demonstration, Verbal cues, and Handouts Education comprehension: verbalized understanding, returned demonstration, verbal cues required, and  tactile cues required  HOME EXERCISE PROGRAM: Access Code: [email or web address] URL: [email or web address] Date: 06/30/2023 Prepared by: Ellin Goodie  Exercises - Seated Shoulder Flexion AAROM with Pulley Behind  - 1 x daily - 7 x weekly - 30 reps - Seated Shoulder Abduction AAROM with Pulley Behind  - 1 x daily - 7 x weekly - 30  reps  ASSESSMENT:  CLINICAL IMPRESSION: Patient is a 89 y.o. white male who was seen today for physical therapy evaluation and treatment for bilateral shoulder pain with RUE>LUE. He shows signs and symptoms of rotator cuff and bicep's tendinopathy and subacromial impingement with pain elicited by AROM and overhead movement. Pt exhibits decreased shoulder ROM and strength and increased shoulder pain with movement. He will benefit from skilled PT to address the aforementioned deficits to improve overhead shoulder movements to lift gun for shooting and lift weights for recreational fitness.      OBJECTIVE IMPAIRMENTS: decreased ROM, decreased strength, impaired UE functional use, postural dysfunction, and pain.   ACTIVITY LIMITATIONS: carrying, lifting, toileting, dressing, and reach over head  PARTICIPATION LIMITATIONS: yard work and recreation: golf   PERSONAL FACTORS: Age, Time since onset of injury/illness/exacerbation, and 3+ comorbidities: HTN, Gout, and Hypothyroidism  are also affecting patient's functional outcome.   REHAB POTENTIAL: Good  CLINICAL DECISION MAKING: Stable/uncomplicated  EVALUATION COMPLEXITY: Low   GOALS: Goals reviewed with patient? No  SHORT TERM GOALS: Target date: 07/14/2023  Patient will demonstrate undestanding of home exercise plan by performing exercises correctly with evidence of good carry over with min to no verbal or tactile cues.  Baseline: NT  Goal status: INITIAL    LONG TERM GOALS: Target date: 09/08/2023  Patient improve QuickDash by >=8 pts as evidence of a statistically significant improvement in his shoulder function to better perform recreational tasks like holding gun and lifting weights.   Baseline: 28 pts Goal status: INITIAL  2.  Patient will improve bilateral shoulder and periscapular strength by 1/3 grade MMT (4- to 4) for improved shoulder function to better perform recreational tasks like holding gun and lifting weights.    Baseline: Shoulder Flex R/L 4/4, Shoulder Abd R/L 4/4, Shoulder ER R/L 4-/4-, Shoulder IR R/L 4/4, Lower Trap R/L 4-/4, Mid Trap R/L 4/4                                                                                                             Goal status: INITIAL  3.  Patient will be able to perform right shoulder flexion isometric hold for >=2 min to be able to hold and shoot gun. Baseline:  NT  Goal status: INITIAL    PLAN:  PT FREQUENCY: 1-2x/week  PT DURATION: 10 weeks  PLANNED INTERVENTIONS: 97164- PT Re-evaluation, 97110-Therapeutic exercises, 97530- Therapeutic activity, [id number]- Neuromuscular re-education, 97140- Manual therapy, [id number]- Aquatic Therapy, 97014- Electrical stimulation (unattended), [id number]- Electrical stimulation (manual), 16109- Traction (mechanical), Taping, Dry Needling, Joint mobilization, Joint manipulation, Spinal manipulation, Spinal mobilization, Cryotherapy, and Moist heat  PLAN FOR NEXT SESSION: Shoulder isometrics and periscapular strengthening.   Ellin Goodie  PT, DPT  [facility] [address], [place], [address] Phone: [phone]   Fax:  [phone]

## 2023-07-14 ENCOUNTER — Ambulatory Visit: Payer: PPO | Attending: Infectious Diseases | Admitting: Physical Therapy

## 2023-07-14 DIAGNOSIS — M25512 Pain in left shoulder: Secondary | ICD-10-CM | POA: Diagnosis not present

## 2023-07-14 DIAGNOSIS — G8929 Other chronic pain: Secondary | ICD-10-CM | POA: Diagnosis not present

## 2023-07-14 DIAGNOSIS — M25511 Pain in right shoulder: Secondary | ICD-10-CM | POA: Diagnosis not present

## 2023-07-14 NOTE — Therapy (Signed)
OUTPATIENT PHYSICAL THERAPY SHOULDER TREATMENT    Patient Name: Jacob LIZOTTE Sr. MRN: [id number] DOB:1933-11-13, 89 y.o., male Today's Date: 07/14/2023  END OF SESSION:  PT End of Session - 07/14/23 1523     Visit Number 2    Number of Visits 20    Date for PT Re-Evaluation 09/08/23    Authorization Type HTA [id number]    Authorization - Visit Number 2    Authorization - Number of Visits 20    Progress Note Due on Visit 10    PT Start Time 1520    PT Stop Time 1600    PT Time Calculation (min) 40 min    Activity Tolerance Patient limited by pain    Behavior During Therapy WFL for tasks assessed/performed             Past Medical History:  Diagnosis Date   Arthritis    right hand   BPH (benign prostatic hyperplasia)    CKD (chronic kidney disease), stage III (HCC)    COVID-19 05/2020   ED (erectile dysfunction)    Elevated PSA    GERD (gastroesophageal reflux disease)    occasional   Glaucoma    Gout    Gout    History of alcoholism (HCC)    History of chicken pox    History of measles    History of mumps    History of pernicious anemia    HTN (hypertension)    controlled   Hypothyroidism    Wears partial dentures    upper and lower    Past Surgical History:  Procedure Laterality Date   CATARACT EXTRACTION W/PHACO Right 10/08/2020   Procedure: CATARACT EXTRACTION PHACO AND INTRAOCULAR LENS PLACEMENT (IOC) RIGHT OMIDRIA;  Surgeon: Galen Manila, MD;  Location: [facility];  Service: Ophthalmology;  Laterality: Right;  CDE 27.87 02:08.6 minutes   CATARACT EXTRACTION W/PHACO Left 10/22/2020   Procedure: CATARACT EXTRACTION PHACO AND INTRAOCULAR LENS PLACEMENT (IOC) LEFT;  Surgeon: Galen Manila, MD;  Location: [facility];  Service: Ophthalmology;  Laterality: Left;  7.97 00:50.1   EYE SURGERY Right 10/24/2019   laser eye surgery on right eye for glaucoma   PARATHYROIDECTOMY  2004   PHOTOCOAGULATION WITH LASER Right 10/07/2015   Procedure:  PHOTOCOAGULATION WITH LASER RIGHT EYE TRANS SCLERAL;  Surgeon: Sherald Hess, MD;  Location: [facility];  Service: Ophthalmology;  Laterality: Right;   TONSILLECTOMY     Patient Active Problem List   Diagnosis Date Noted   Hyperuricemia 03/08/2020   Senile nuclear sclerosis, bilateral 02/02/2020   Tinea corporis 07/22/2016   Elevated PSA 07/13/2015   Allergic rhinitis 03/08/2015   BPH (benign prostatic hyperplasia) 03/08/2015   Chronic kidney disease (CKD), stage III (moderate) (HCC) 03/08/2015   Essential (primary) hypertension 03/08/2015   Gouty arthritis 03/08/2015   H/O alcohol abuse 03/08/2015   Hyperlipidemia 03/08/2015   Hypothyroid 03/08/2015   Primary open angle glaucoma of both eyes, severe stage 03/08/2015   Osteopenia 03/08/2015   Pernicious anemia 03/08/2015   Pruritus 03/08/2015   Psoriasiform dermatitis 04/18/2013   Other long term (current) drug therapy 03/15/2013   Polypharmacy 03/15/2013   Dermatophytosis of groin 08/02/2012   Dermatitis, eczematoid 05/17/2012    PCP: Dr. Clydie Braun    REFERRING PROVIDER: Dr.   Cindi Carbon DIAG:  [phone] (ICD-10-CM) - Chronic right shoulder pain M25.512,G89.29 (ICD-10-CM) - Chronic left shoulder pain    THERAPY DIAG:  Chronic right shoulder pain  Chronic left shoulder pain  Rationale for Evaluation and Treatment: Rehabilitation  ONSET DATE: 08/2022   SUBJECTIVE:                                                                                                                                                                                      SUBJECTIVE STATEMENT: Pt reports experiencing increased low back pain and he is not sure how he did it. He has been able to do the exercises without much difficulty.  Hand dominance: Right  PERTINENT HISTORY: Shoulders started hurting 9 months ago and came on with insidious onset.   Pt reports that he received cortisone shots in both his shoulders  and he is now feeling pain again. 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 Pt reports also night sweats that have been worsening, but he does not feel shoulder pain at night unless he lays on his sides.   PAIN:  Are you having pain? Yes: NPRS scale: 3/10 Pain location: On top of AC joint  Pain description: Achy Aggravating factors: Abducting, flexing and externally rotating overhead  Relieving factors: Keep shoulders down by his side and not laying on his shoulders  PRECAUTIONS: None  RED FLAGS: None   WEIGHT BEARING RESTRICTIONS: No  FALLS:  Has patient fallen in last 6 months? No  LIVING ENVIRONMENT: Lives with: lives with their spouse Lives in: House/apartment Stairs: Yes: Internal: 16 steps; on right going up and External: 2 steps; none Has following equipment at home: None  OCCUPATION: Retired   PLOF: Independent  PATIENT GOALS: Patient wants to return to lifting weight   NEXT MD VISIT: February 14th, 2025   OBJECTIVE:  Note: Objective measures were completed at Evaluation unless otherwise noted.  VITALS BP 141/68 HR 70 SpO2 100%  DIAGNOSTIC FINDINGS:  No imaging listed in charts   PATIENT SURVEYS:  Quick Dash including Sports Module= 28%   COGNITION: Overall cognitive status: Within functional limits for tasks assessed     SENSATION: WFL  POSTURE: Forward head and rounded shoulders   UPPER EXTREMITY ROM:                                                                   Cervical                                AROM  Flex    45                                           Ext      45                        Lat Side Bend R/L 45/45                       Rotation       R/L     60/60  Active ROM Right eval Left eval  Shoulder flexion 160* 160*  Shoulder extension    Shoulder abduction 160* 160*  Shoulder adduction    Shoulder internal rotation 70 70  Shoulder external rotation  90 90  Elbow flexion    Elbow extension    Wrist flexion    Wrist extension    Wrist ulnar deviation    Wrist radial deviation    Wrist pronation    Wrist supination    (Blank rows = not tested)                                               UPPER EXTREMITY MMT:  MMT Right eval Left eval  Shoulder flexion 4 4  Shoulder extension 4 4  Shoulder abduction 4 4  Shoulder adduction    Shoulder internal rotation 4 4  Shoulder external rotation 4-* 4-*  Middle trapezius 4 4  Lower trapezius 4- 4  Elbow flexion    Elbow extension    Wrist flexion    Wrist extension    Wrist ulnar deviation    Wrist radial deviation    Wrist pronation    Wrist supination    Grip strength (lbs)    (Blank rows = not tested)  SHOULDER SPECIAL TESTS: Impingement tests: Neer impingement test: NT, Hawkins/Kennedy impingement test: NT, and Painful arc test: NT  Rotator cuff assessment: Full can test: positive , Belly press test: negative, and Infraspinatus test: positive  Biceps assessment: Speed's test: negative  JOINT MOBILITY TESTING:  Not tested    PALPATION:  AC joint TTP                                                                                                                              TREATMENT DATE:  07/14/23: THEREX  UBE with seat at 10- 2.5 forward and 2.5 min backward  OMEGA Seated Rows #25 1 x 10  OMEGA Seated Rows #35 1 x 10  OMEGA Seated Rows #45 1 x 10  OMEGA Lat Pulldown #20 1 x 20  OMEGA Lat Pulldown #25 1 x 10  OMEGA Lat Pulldown #35  1 x 10  Doorway  Stretch 45 deg 1 x 30 sec  Doorway Stretch 90 deg 1 x 30 sec  Doorway Stretch 120 deg 1 x 30 sec  Supine Pec Stretch with hands in ER 2 x 60 sec     Eval (06/30/23) Shoulder Flexion/Extension AAROM 1 x 30  Shoulder Abduction/Adduction AAROM 1 x 30    PATIENT EDUCATION: Education details: Form and technique for correct performance of exercise and explanation of rotator cuff pathology.  Person educated:  Patient Education method: Explanation, Demonstration, Verbal cues, and Handouts Education comprehension: verbalized understanding, returned demonstration, verbal cues required, and tactile cues required  HOME EXERCISE PROGRAM:  Access Code: [email or web address] URL: [email or web address] Date: 07/14/2023 Prepared by: Ellin Goodie  Exercises - Seated Shoulder Flexion AAROM with Pulley Behind  - 1 x daily - 7 x weekly - 30 reps - Seated Shoulder Abduction AAROM with Pulley Behind  - 1 x daily - 7 x weekly - 30 reps - Supine Thoracic Mobilization Towel Roll Vertical with Arm Stretch  - 3-4 x weekly - 3 reps - 60 sec  hold - Shoulder External Rotation and Scapular Retraction with Resistance  - 3-4 x weekly - 3 sets - 10 reps ASSESSMENT:  CLINICAL IMPRESSION: Pt shows improvement with activity tolerance with no increase in his shoulder pain throughout the session even without taking pain medications before session. He does continues to experience increased pain with overhead, so home exercise plan modified to include pec stretch in supine. He will continue to benefit from skilled PT to address the aforementioned deficits to improve overhead shoulder movements to lift gun for shooting and lift weights for recreational fitness.    OBJECTIVE IMPAIRMENTS: decreased ROM, decreased strength, impaired UE functional use, postural dysfunction, and pain.   ACTIVITY LIMITATIONS: carrying, lifting, toileting, dressing, and reach over head  PARTICIPATION LIMITATIONS: yard work and recreation: golf   PERSONAL FACTORS: Age, Time since onset of injury/illness/exacerbation, and 3+ comorbidities: HTN, Gout, and Hypothyroidism  are also affecting patient's functional outcome.   REHAB POTENTIAL: Good  CLINICAL DECISION MAKING: Stable/uncomplicated  EVALUATION COMPLEXITY: Low   GOALS: Goals reviewed with patient? No  SHORT TERM GOALS: Target date: 07/14/2023  Patient will demonstrate undestanding  of home exercise plan by performing exercises correctly with evidence of good carry over with min to no verbal or tactile cues.  Baseline: NT 07/14/23: Performing independently  Goal status: ACHIEVED     LONG TERM GOALS: Target date: 09/08/2023  Patient improve QuickDash by >=8 pts as evidence of a statistically significant improvement in his shoulder function to better perform recreational tasks like holding gun and lifting weights.   Baseline: 28 pts Goal status: ONGOING   2.  Patient will improve bilateral shoulder and periscapular strength by 1/3 grade MMT (4- to 4) for improved shoulder function to better perform recreational tasks like holding gun and lifting weights.   Baseline: Shoulder Flex R/L 4/4, Shoulder Abd R/L 4/4, Shoulder ER R/L 4-/4-, Shoulder IR R/L 4/4, Lower Trap R/L 4-/4, Mid Trap R/L 4/4  Goal status: ONGOING   3.  Patient will be able to perform right shoulder flexion isometric hold for >=2 min to be able to hold and shoot gun. Baseline:  NT  Goal status: ONGOING     PLAN:  PT FREQUENCY: 1-2x/week  PT DURATION: 10 weeks  PLANNED INTERVENTIONS: 97164- PT Re-evaluation, 97110-Therapeutic exercises, 97530- Therapeutic activity, [id number]- Neuromuscular re-education, 97140- Manual therapy, [id number]- Aquatic Therapy, 97014- Electrical stimulation (unattended), [id number]- Electrical stimulation (manual), 16109- Traction (mechanical), Taping, Dry Needling, Joint mobilization, Joint manipulation, Spinal manipulation, Spinal mobilization, Cryotherapy, and Moist heat  PLAN FOR NEXT SESSION: Have pt flex arm to 90 degrees and hold for 2 minutes. Shoulder horizontal abduction, continue OMEGA machines, shoulder flexion and abduction with bands    Ellin Goodie PT, DPT  [facility] [address], [place], [address] Phone:  [phone]   Fax:  [phone]

## 2023-07-20 ENCOUNTER — Ambulatory Visit: Payer: PPO | Admitting: Physical Therapy

## 2023-07-20 DIAGNOSIS — G8929 Other chronic pain: Secondary | ICD-10-CM

## 2023-07-20 DIAGNOSIS — M25511 Pain in right shoulder: Secondary | ICD-10-CM | POA: Diagnosis not present

## 2023-07-20 NOTE — Therapy (Addendum)
OUTPATIENT PHYSICAL THERAPY SHOULDER TREATMENT    Patient Name: Jacob METER Sr. MRN: [id number] DOB:Sep 20, 1933, 89 y.o., male Today's Date: 07/20/2023  END OF SESSION:  PT End of Session - 07/20/23 1607     Visit Number 3    Number of Visits 20    Date for PT Re-Evaluation 09/08/23    Authorization Type HTA [id number]    Authorization - Visit Number 3    Authorization - Number of Visits 20    Progress Note Due on Visit 10    PT Start Time 1605    PT Stop Time 1645    PT Time Calculation (min) 40 min    Activity Tolerance Patient limited by pain    Behavior During Therapy WFL for tasks assessed/performed              Past Medical History:  Diagnosis Date   Arthritis    right hand   BPH (benign prostatic hyperplasia)    CKD (chronic kidney disease), stage III (HCC)    COVID-19 05/2020   ED (erectile dysfunction)    Elevated PSA    GERD (gastroesophageal reflux disease)    occasional   Glaucoma    Gout    Gout    History of alcoholism (HCC)    History of chicken pox    History of measles    History of mumps    History of pernicious anemia    HTN (hypertension)    controlled   Hypothyroidism    Wears partial dentures    upper and lower    Past Surgical History:  Procedure Laterality Date   CATARACT EXTRACTION W/PHACO Right 10/08/2020   Procedure: CATARACT EXTRACTION PHACO AND INTRAOCULAR LENS PLACEMENT (IOC) RIGHT OMIDRIA;  Surgeon: Galen Manila, MD;  Location: [facility];  Service: Ophthalmology;  Laterality: Right;  CDE 27.87 02:08.6 minutes   CATARACT EXTRACTION W/PHACO Left 10/22/2020   Procedure: CATARACT EXTRACTION PHACO AND INTRAOCULAR LENS PLACEMENT (IOC) LEFT;  Surgeon: Galen Manila, MD;  Location: [facility];  Service: Ophthalmology;  Laterality: Left;  7.97 00:50.1   EYE SURGERY Right 10/24/2019   laser eye surgery on right eye for glaucoma   PARATHYROIDECTOMY  2004   PHOTOCOAGULATION WITH LASER Right 10/07/2015   Procedure:  PHOTOCOAGULATION WITH LASER RIGHT EYE TRANS SCLERAL;  Surgeon: Sherald Hess, MD;  Location: [facility];  Service: Ophthalmology;  Laterality: Right;   TONSILLECTOMY     Patient Active Problem List   Diagnosis Date Noted   Hyperuricemia 03/08/2020   Senile nuclear sclerosis, bilateral 02/02/2020   Tinea corporis 07/22/2016   Elevated PSA 07/13/2015   Allergic rhinitis 03/08/2015   BPH (benign prostatic hyperplasia) 03/08/2015   Chronic kidney disease (CKD), stage III (moderate) (HCC) 03/08/2015   Essential (primary) hypertension 03/08/2015   Gouty arthritis 03/08/2015   H/O alcohol abuse 03/08/2015   Hyperlipidemia 03/08/2015   Hypothyroid 03/08/2015   Primary open angle glaucoma of both eyes, severe stage 03/08/2015   Osteopenia 03/08/2015   Pernicious anemia 03/08/2015   Pruritus 03/08/2015   Psoriasiform dermatitis 04/18/2013   Other long term (current) drug therapy 03/15/2013   Polypharmacy 03/15/2013   Dermatophytosis of groin 08/02/2012   Dermatitis, eczematoid 05/17/2012    PCP: Dr. Clydie Braun    REFERRING PROVIDER: Dr.   Cindi Carbon DIAG:  [phone] (ICD-10-CM) - Chronic right shoulder pain M25.512,G89.29 (ICD-10-CM) - Chronic left shoulder pain    THERAPY DIAG:  Chronic right shoulder pain  Chronic left shoulder pain  Rationale for Evaluation and Treatment: Rehabilitation  ONSET DATE: 08/2022   SUBJECTIVE:                                                                                                                                                                                      SUBJECTIVE STATEMENT: PT reports that he continues to feel some soreness and pain in the anterior surface of both shoulders especially when reaching overhead. He states that this pain is relieved when taking NSAIDs   Hand dominance: Right  PERTINENT HISTORY: Shoulders started hurting 9 months ago and came on with insidious onset.   Pt reports  that he received cortisone shots in both his shoulders and he is now feeling pain again. 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 Pt reports also night sweats that have been worsening, but he does not feel shoulder pain at night unless he lays on his sides.   PAIN:  Are you having pain? Yes: NPRS scale: 3/10 Pain location: On top of AC joint  Pain description: Achy Aggravating factors: Abducting, flexing and externally rotating overhead  Relieving factors: Keep shoulders down by his side and not laying on his shoulders  PRECAUTIONS: None  RED FLAGS: None   WEIGHT BEARING RESTRICTIONS: No  FALLS:  Has patient fallen in last 6 months? No  LIVING ENVIRONMENT: Lives with: lives with their spouse Lives in: House/apartment Stairs: Yes: Internal: 16 steps; on right going up and External: 2 steps; none Has following equipment at home: None  OCCUPATION: Retired   PLOF: Independent  PATIENT GOALS: Patient wants to return to lifting weight   NEXT MD VISIT: February 14th, 2025   OBJECTIVE:  Note: Objective measures were completed at Evaluation unless otherwise noted.  VITALS BP 141/68 HR 70 SpO2 100%  DIAGNOSTIC FINDINGS:  No imaging listed in charts   PATIENT SURVEYS:  Quick Dash including Sports Module= 28%   COGNITION: Overall cognitive status: Within functional limits for tasks assessed     SENSATION: WFL  POSTURE: Forward head and rounded shoulders   UPPER EXTREMITY ROM:                                                                   Cervical  AROM                                            Flex    45                                           Ext      45                        Lat Side Bend R/L 45/45                       Rotation       R/L     60/60  Active ROM Right eval Left eval  Shoulder flexion 160* 160*  Shoulder extension    Shoulder abduction 160* 160*  Shoulder adduction     Shoulder internal rotation 70 70  Shoulder external rotation 90 90  Elbow flexion    Elbow extension    Wrist flexion    Wrist extension    Wrist ulnar deviation    Wrist radial deviation    Wrist pronation    Wrist supination    (Blank rows = not tested)                                               UPPER EXTREMITY MMT:  MMT Right eval Left eval  Shoulder flexion 4 4  Shoulder extension 4 4  Shoulder abduction 4 4  Shoulder adduction    Shoulder internal rotation 4 4  Shoulder external rotation 4-* 4-*  Middle trapezius 4 4  Lower trapezius 4- 4  Elbow flexion    Elbow extension    Wrist flexion    Wrist extension    Wrist ulnar deviation    Wrist radial deviation    Wrist pronation    Wrist supination    Grip strength (lbs)    (Blank rows = not tested)  SHOULDER SPECIAL TESTS: Impingement tests: Neer impingement test: NT, Hawkins/Kennedy impingement test: NT, and Painful arc test: NT  Rotator cuff assessment: Full can test: positive , Belly press test: negative, and Infraspinatus test: positive  Biceps assessment: Speed's test: negative  JOINT MOBILITY TESTING:  Not tested    PALPATION:  AC joint TTP                                                                                                                              TREATMENT DATE:  07/20/23: THEREX  UBE with seat at 10- 2.5 forward and 2.5 min backward  OMEGA Seated Rows #35 1 x 10  OMEGA Seated Rows #45 2 x 10  OMEGA Lat Pulldown #35 3 x 10  Shoulder Flexion to Horizontal #3 DB 1 x 10  Shoulder Flexion #3  DB 3 x 10  -Pt reports NRPS of 3/10  Standing Horizontal Abduction 1 x 10  -Pt reports NRPS of 4/10  Bent Over T's 2 x 10 Bent Over T's with #3 DB 2 x 10   -Pt reports no increase in his pain  07/14/23: THEREX  UBE with seat at 10- 2.5 forward and 2.5 min backward  OMEGA Seated Rows #25 1 x 10  OMEGA Seated Rows #35 1 x 10  OMEGA Seated Rows #45 1 x 10  OMEGA Lat Pulldown  #20 1 x 20  OMEGA Lat Pulldown #25 1 x 10  OMEGA Lat Pulldown #35 1 x 10  Doorway  Stretch 45 deg 1 x 30 sec  Doorway Stretch 90 deg 1 x 30 sec  Doorway Stretch 120 deg 1 x 30 sec  Supine Pec Stretch with hands in ER 2 x 60 sec     Eval (06/30/23) Shoulder Flexion/Extension AAROM 1 x 30  Shoulder Abduction/Adduction AAROM 1 x 30    PATIENT EDUCATION: Education details: Form and technique for correct performance of exercise and explanation of rotator cuff pathology.  Person educated: Patient Education method: Explanation, Demonstration, Verbal cues, and Handouts Education comprehension: verbalized understanding, returned demonstration, verbal cues required, and tactile cues required  HOME EXERCISE PROGRAM:  Access Code: [email or web address] URL: [email or web address] Date: 07/20/2023 Prepared by: Ellin Goodie  Exercises - Seated Shoulder Flexion AAROM with Pulley Behind  - 1 x daily - 7 x weekly - 30 reps - Seated Shoulder Abduction AAROM with Pulley Behind  - 1 x daily - 7 x weekly - 30 reps - Supine Thoracic Mobilization Towel Roll Vertical with Arm Stretch  - 3-4 x weekly - 3 reps - 60 sec  hold - Shoulder External Rotation and Scapular Retraction with Resistance  - 3-4 x weekly - 3 sets - 10 reps - Shoulder Abduction with Dumbbells - Thumbs Up  - 3-4 x weekly - 2 sets - 10 reps - 5 sec hold - Seated Thoracic Lumbar Extension with Pectoralis Stretch  - 1 x daily - 3 reps - 60 sec hold   ASSESSMENT:  CLINICAL IMPRESSION: Pt continues to experience increased shoulder pain when reaching overhead. He still able to perform overhead exercises with an increase in tolerance for shoulder strengthening activities at 90 degrees. Pt continues to experience most pain with shoulder flexion past 90 degrees especially with added weight. He will continue to benefit from skilled PT to address the aforementioned deficits to improve overhead shoulder movements to lift gun for shooting and  lift weights for recreational fitness.  OBJECTIVE IMPAIRMENTS: decreased ROM, decreased strength, impaired UE functional use, postural dysfunction, and pain.   ACTIVITY LIMITATIONS: carrying, lifting, toileting, dressing, and reach over head  PARTICIPATION LIMITATIONS: yard work and recreation: golf   PERSONAL FACTORS: Age, Time since onset of injury/illness/exacerbation, and 3+ comorbidities: HTN, Gout, and Hypothyroidism  are also affecting patient's functional outcome.   REHAB POTENTIAL: Good  CLINICAL DECISION MAKING: Stable/uncomplicated  EVALUATION COMPLEXITY: Low   GOALS: Goals reviewed with patient? No  SHORT TERM GOALS: Target date: 07/14/2023  Patient will demonstrate undestanding of home exercise plan by performing exercises correctly with evidence of good carry over with min to no verbal or tactile cues.  Baseline: NT 07/14/23: Performing  independently  Goal status: ACHIEVED     LONG TERM GOALS: Target date: 09/08/2023  Patient improve QuickDash by >=8 pts as evidence of a statistically significant improvement in his shoulder function to better perform recreational tasks like holding gun and lifting weights.   Baseline: 28 pts Goal status: ONGOING   2.  Patient will improve bilateral shoulder and periscapular strength by 1/3 grade MMT (4- to 4) for improved shoulder function to better perform recreational tasks like holding gun and lifting weights.   Baseline: Shoulder Flex R/L 4/4, Shoulder Abd R/L 4/4, Shoulder ER R/L 4-/4-, Shoulder IR R/L 4/4, Lower Trap R/L 4-/4, Mid Trap R/L 4/4                                                                                                             Goal status: ONGOING   3.  Patient will be able to perform right shoulder flexion isometric hold for >=2 min to be able to hold and shoot gun. Baseline:  NT  Goal status: ONGOING     PLAN:  PT FREQUENCY: 1-2x/week  PT DURATION: 10 weeks  PLANNED INTERVENTIONS: 97164- PT  Re-evaluation, 97110-Therapeutic exercises, 97530- Therapeutic activity, [id number]- Neuromuscular re-education, 97140- Manual therapy, [id number]- Aquatic Therapy, 97014- Electrical stimulation (unattended), [id number]- Electrical stimulation (manual), 16109- Traction (mechanical), Taping, Dry Needling, Joint mobilization, Joint manipulation, Spinal manipulation, Spinal mobilization, Cryotherapy, and Moist heat  PLAN FOR NEXT SESSION: Test shoulder isometric shoulder abduction and flexion . Continue with  Bicep curls to press ups, serratus push ups, face pulls or lower trap setting.      Ellin Goodie PT, DPT  [facility] [address] [place], [address] Phone: [phone]   Fax:  [phone]

## 2023-07-29 NOTE — Progress Notes (Deleted)
 07/30/2023 9:42 PM   Jacob Leitha Schuller Sr. February 09, 1934 [id number]  Referring provider: Mick Sell, MD [address] [place],  [place] [address]  Urological history 1.  Elevated PSA -PSA (07/2022) 2.1  2. BP with LU TS -Doxazosin 4 mg daily finasteride 5 mg daily  No chief complaint on file.  HPI: Jacob SOCARRAS Sr. is a 89 y.o. male  who presents today for follow up.   Previous records reviewed.     I PSS ***     Score:  1-7 Mild 8-19 Moderate 20-35 Severe   PMH: Past Medical History:  Diagnosis Date   Arthritis    right hand   BPH (benign prostatic hyperplasia)    CKD (chronic kidney disease), stage III (HCC)    COVID-19 05/2020   ED (erectile dysfunction)    Elevated PSA    GERD (gastroesophageal reflux disease)    occasional   Glaucoma    Gout    Gout    History of alcoholism (HCC)    History of chicken pox    History of measles    History of mumps    History of pernicious anemia    HTN (hypertension)    controlled   Hypothyroidism    Wears partial dentures    upper and lower     Surgical History: Past Surgical History:  Procedure Laterality Date   CATARACT EXTRACTION W/PHACO Right 10/08/2020   Procedure: CATARACT EXTRACTION PHACO AND INTRAOCULAR LENS PLACEMENT (IOC) RIGHT OMIDRIA;  Surgeon: Galen Manila, MD;  Location: [facility];  Service: Ophthalmology;  Laterality: Right;  CDE 27.87 02:08.6 minutes   CATARACT EXTRACTION W/PHACO Left 10/22/2020   Procedure: CATARACT EXTRACTION PHACO AND INTRAOCULAR LENS PLACEMENT (IOC) LEFT;  Surgeon: Galen Manila, MD;  Location: [facility];  Service: Ophthalmology;  Laterality: Left;  7.97 00:50.1   EYE SURGERY Right 10/24/2019   laser eye surgery on right eye for glaucoma   PARATHYROIDECTOMY  2004   PHOTOCOAGULATION WITH LASER Right 10/07/2015   Procedure: PHOTOCOAGULATION WITH LASER RIGHT EYE TRANS SCLERAL;  Surgeon: Sherald Hess, MD;  Location: [facility];  Service: Ophthalmology;  Laterality: Right;   TONSILLECTOMY      Home Medications:  Allergies as of 07/30/2023       Reactions   Krystexxa  [pegloticase] Anaphylaxis   Allopurinol    Abdominal pain and itching   Ciprofloxacin    causes mouth to itch   Levofloxacin Other (See Comments)   Tendon pain   Mycophenolate Mofetil    Niacin    Other reaction(s): UNKNOWN   Shellfish Allergy    agrivates gout   Sulfa Antibiotics Itching   Uloric  [febuxostat]    Other reaction(s): Abdominal pain   Finasteride Itching        Medication List        Accurate as of July 29, 2023  9:42 PM. If you have any questions, ask your nurse or doctor.          allopurinol 100 MG tablet Commonly known as: ZYLOPRIM Take 1 tablet by mouth 2 (two) times daily.   brimonidine 0.2 % ophthalmic solution Commonly known as: ALPHAGAN Place 1 drop into both eyes 2 (two) times daily.   colchicine 0.6 MG tablet TAKE 1 TABLET BY MOUTH EVERY DAY AS NEEDED FOR GOUT   cyanocobalamin 1000 MCG/ML injection Commonly known as: VITAMIN B12 INJECT 1 ML (1,000 MCG TOTAL) INTO THE MUSCLE EVERY 30 (THIRTY) DAYS.  dorzolamide-timolol 2-0.5 % ophthalmic solution Commonly known as: COSOPT Place 1 drop into both eyes 2 (two) times daily.   doxazosin 4 MG tablet Commonly known as: CARDURA Take 4 mg by mouth at bedtime.   ferrous sulfate 325 (65 FE) MG tablet Take 1 tablet by mouth daily with breakfast.   finasteride 5 MG tablet Commonly known as: PROSCAR Take 1 tablet (5 mg total) by mouth daily.   hydrOXYzine 10 MG tablet Commonly known as: ATARAX Take 10 mg by mouth 3 (three) times daily as needed for itching.   irbesartan 300 MG tablet Commonly known as: AVAPRO Take 300 mg by mouth daily.   latanoprost 0.005 % ophthalmic solution Commonly known as: XALATAN Place 1 drop into both eyes at bedtime.   levothyroxine 125 MCG tablet Commonly known as: SYNTHROID Take by mouth.    meloxicam 7.5 MG tablet Commonly known as: MOBIC TAKE 1 TABLET BY MOUTH DAILY AS NEEDED.   multivitamin tablet Take 1 tablet by mouth daily.   Needles & Syringes Misc Use one each month for vitamin b12 injection   sildenafil 20 MG tablet Commonly known as: REVATIO Take 3 to 5 tablets two hours before intercouse on an empty stomach.  Do not take with nitrates.   sildenafil 50 MG tablet Commonly known as: VIAGRA Take by mouth.   vitamin C 100 MG tablet Take by mouth daily. Dose unsure   Vitamin D3 10 MCG (400 UNIT) tablet Take by mouth daily. Unsure dose        Allergies:  Allergies  Allergen Reactions   Krystexxa  [Pegloticase] Anaphylaxis   Allopurinol     Abdominal pain and itching   Ciprofloxacin     causes mouth to itch   Levofloxacin Other (See Comments)    Tendon pain   Mycophenolate Mofetil    Niacin     Other reaction(s): UNKNOWN   Shellfish Allergy     agrivates gout   Sulfa Antibiotics Itching   Uloric  [Febuxostat]     Other reaction(s): Abdominal pain   Finasteride Itching    Family History: Family History  Problem Relation Age of Onset   Asthma Mother    Heart attack Father    Parkinson's disease Father    Kidney disease Neg Hx    Prostate cancer Neg Hx        maybe paternal grandfather ? unsure    Social History:  reports that he quit smoking about 45 years ago. His smoking use included cigarettes. He started smoking about 65 years ago. He has a 30 pack-year smoking history. He has been exposed to tobacco smoke. He has never used smokeless tobacco. He reports that he does not drink alcohol and does not use drugs.  ROS: For pertinent review of systems please refer to history of present illness  Physical Exam: There were no vitals taken for this visit.  Constitutional:  Well nourished. Alert and oriented, No acute distress. HEENT: [place] AT, moist mucus membranes.  Trachea midline, no masses. Cardiovascular: No clubbing, cyanosis, or  edema. Respiratory: Normal respiratory effort, no increased work of breathing. GI: Abdomen is soft, non tender, non distended, no abdominal masses. Liver and spleen not palpable.  No hernias appreciated.  Stool sample for occult testing is not indicated.   GU: No CVA tenderness.  No bladder fullness or masses.  Patient with circumcised/uncircumcised phallus. ***Foreskin easily retracted***  Urethral meatus is patent.  No penile discharge. No penile lesions or rashes. Scrotum without lesions, cysts,  rashes and/or edema.  Testicles are located scrotally bilaterally. No masses are appreciated in the testicles. Left and right epididymis are normal. Rectal: Patient with  normal sphincter tone. Anus and perineum without scarring or rashes. No rectal masses are appreciated. Prostate is approximately *** grams, *** nodules are appreciated. Seminal vesicles are normal. Skin: No rashes, bruises or suspicious lesions. Lymph: No cervical or inguinal adenopathy. Neurologic: Grossly intact, no focal deficits, moving all 4 extremities. Psychiatric: Normal mood and affect.   Laboratory Data:  CBC w/auto Differential (5 Part) Order: [id number] Component Ref Range & Units 1 mo ago  WBC (White Blood Cell Count) 4.1 - 10.2 10^3/uL 7.2  RBC (Red Blood Cell Count) 4.69 - 6.13 10^6/uL 4.4 Low   Hemoglobin 14.1 - 18.1 gm/dL 40.9 Low   Hematocrit 81.1 - 52.0 % 40.6  MCV (Mean Corpuscular Volume) 80.0 - 100.0 fl 92.3  MCH (Mean Corpuscular Hemoglobin) 27.0 - 31.2 pg 30.7  MCHC (Mean Corpuscular Hemoglobin Concentration) 32.0 - 36.0 gm/dL 91.4  Platelet Count 782 - 450 10^3/uL 209  RDW-CV (Red Cell Distribution Width) 11.6 - 14.8 % 14.3  MPV (Mean Platelet Volume) 9.4 - 12.4 fl 10.8  Neutrophils 1.50 - 7.80 10^3/uL 4.01  Lymphocytes 1.00 - 3.60 10^3/uL 2.21  Monocytes 0.00 - 1.50 10^3/uL 0.61  Eosinophils 0.00 - 0.55 10^3/uL 0.29  Basophils 0.00 - 0.09 10^3/uL 0.06  Neutrophil % 32.0 - 70.0 % 55.9   Lymphocyte % 10.0 - 50.0 % 30.7  Monocyte % 4.0 - 13.0 % 8.5  Eosinophil % 1.0 - 5.0 % 4  Basophil% 0.0 - 2.0 % 0.8  Immature Granulocyte % <=0.7 % 0.1  Immature Granulocyte Count <=0.06 10^3/L 0.01  Resulting Agency [facility]   Specimen Collected: 06/15/23 12:35   Performed by: [facility] Last Resulted: 06/15/23 13:16  Received From: [facility]  Result Received: 06/29/23 10:44   ains abnormal data Comprehensive Metabolic Panel (CMP) Order: [id number] Component Ref Range & Units 1 mo ago  Glucose 70 - 110 mg/dL 97  Sodium 578 - 469 mmol/L 141  Potassium 3.6 - 5.1 mmol/L 4.3  Chloride 97 - 109 mmol/L 106  Carbon Dioxide (CO2) 22.0 - 32.0 mmol/L 29.5  Urea Nitrogen (BUN) 7 - 25 mg/dL 24  Creatinine 0.7 - 1.3 mg/dL 1.4 High   Glomerular Filtration Rate (eGFR) >60 mL/min/1.73sq m 49 Low   Comment: CKD-EPI (2021) does not include patient's race in the calculation of eGFR.  Monitoring changes of plasma creatinine and eGFR over time is useful for monitoring kidney function.  Interpretive Ranges for eGFR (CKD-EPI 2021):  eGFR:       >60 mL/min/1.73 sq. m - Normal eGFR:       30-59 mL/min/1.73 sq. m - Moderately Decreased eGFR:       15-29 mL/min/1.73 sq. m  - Severely Decreased eGFR:       < 15 mL/min/1.73 sq. m  - Kidney Failure   Note: These eGFR calculations do not apply in acute situations when eGFR is changing rapidly or patients on dialysis.  Calcium 8.7 - 10.3 mg/dL 9.2  AST 8 - 39 U/L 15  ALT 6 - 57 U/L 9  Alk Phos (alkaline Phosphatase) 34 - 104 U/L 80  Albumin 3.5 - 4.8 g/dL 4.2  Bilirubin, Total 0.3 - 1.2 mg/dL 0.6  Protein, Total 6.1 - 7.9 g/dL 6.4  A/G Ratio 1.0 - 5.0 gm/dL 1.9  Resulting Agency [facility]   Specimen Collected: 06/15/23 12:35   Performed by: [facility] Last Resulted: 06/15/23 16:47  Received From: [facility]  Result  Received: 06/29/23 10:44   Lipid Panel w/calc LDL Order: [id number] Component Ref Range & Units 1 mo ago  Cholesterol, Total 100 - 200 mg/dL 253  Triglyceride 35 - 199 mg/dL 67  HDL (High Density Lipoprotein) Cholesterol 29.0 - 71.0 mg/dL 66.4  LDL Calculated 0 - 130 mg/dL 94  VLDL Cholesterol mg/dL 13  Cholesterol/HDL Ratio 3.7  Resulting Agency [facility]   Specimen Collected: 06/15/23 12:35   Performed by: [facility] Last Resulted: 06/15/23 16:49  Received From: [facility]  Result Received: 06/29/23 10:44  I have reviewed the labs.  Assessment & Plan:    1. Elevated PSA -PSA pending  2. BPH with LUTS -at goal with the medication - continue the finasteride 5 mg daily and doxazosin 4 mg  3. Erectile dysfunction:  - not sexually active at this time  No follow-ups on file.  Cloretta Ned  [facility] [address] [place], [place] [address] [phone]

## 2023-07-30 ENCOUNTER — Ambulatory Visit: Payer: Self-pay | Admitting: Urology

## 2023-07-30 DIAGNOSIS — N138 Other obstructive and reflux uropathy: Secondary | ICD-10-CM

## 2023-08-02 ENCOUNTER — Encounter: Payer: Self-pay | Admitting: Physical Therapy

## 2023-08-02 ENCOUNTER — Encounter: Payer: Self-pay | Admitting: Urology

## 2023-08-02 ENCOUNTER — Ambulatory Visit: Payer: PPO | Attending: Infectious Diseases | Admitting: Physical Therapy

## 2023-08-02 DIAGNOSIS — M25512 Pain in left shoulder: Secondary | ICD-10-CM | POA: Diagnosis not present

## 2023-08-02 DIAGNOSIS — G8929 Other chronic pain: Secondary | ICD-10-CM | POA: Diagnosis not present

## 2023-08-02 DIAGNOSIS — M25511 Pain in right shoulder: Secondary | ICD-10-CM | POA: Diagnosis not present

## 2023-08-02 NOTE — Therapy (Signed)
 OUTPATIENT PHYSICAL THERAPY SHOULDER TREATMENT    Patient Name: Jacob LITT Sr. MRN: [id number] DOB:[birth year], 89 y.o., male Today's Date: 08/02/2023  END OF SESSION:  PT End of Session - 08/02/23 0826     Visit Number 4    Number of Visits 20    Date for PT Re-Evaluation 09/08/23    Authorization Type HTA [id number]    Authorization - Visit Number 4    Authorization - Number of Visits 20    Progress Note Due on Visit 10    PT Start Time 0820    PT Stop Time 0900    PT Time Calculation (min) 40 min    Activity Tolerance Patient limited by pain    Behavior During Therapy WFL for tasks assessed/performed              Past Medical History:  Diagnosis Date   Arthritis    right hand   BPH (benign prostatic hyperplasia)    CKD (chronic kidney disease), stage III (HCC)    COVID-19 05/2020   ED (erectile dysfunction)    Elevated PSA    GERD (gastroesophageal reflux disease)    occasional   Glaucoma    Gout    Gout    History of alcoholism (HCC)    History of chicken pox    History of measles    History of mumps    History of pernicious anemia    HTN (hypertension)    controlled   Hypothyroidism    Wears partial dentures    upper and lower    Past Surgical History:  Procedure Laterality Date   CATARACT EXTRACTION W/PHACO Right 10/08/2020   Procedure: CATARACT EXTRACTION PHACO AND INTRAOCULAR LENS PLACEMENT (IOC) RIGHT OMIDRIA;  Surgeon: Galen Manila, MD;  Location: [facility];  Service: Ophthalmology;  Laterality: Right;  CDE 27.87 02:08.6 minutes   CATARACT EXTRACTION W/PHACO Left 10/22/2020   Procedure: CATARACT EXTRACTION PHACO AND INTRAOCULAR LENS PLACEMENT (IOC) LEFT;  Surgeon: Galen Manila, MD;  Location: [facility];  Service: Ophthalmology;  Laterality: Left;  7.97 00:50.1   EYE SURGERY Right 10/24/2019   laser eye surgery on right eye for glaucoma   PARATHYROIDECTOMY  2004   PHOTOCOAGULATION WITH LASER Right 10/07/2015   Procedure:  PHOTOCOAGULATION WITH LASER RIGHT EYE TRANS SCLERAL;  Surgeon: Sherald Hess, MD;  Location: [facility];  Service: Ophthalmology;  Laterality: Right;   TONSILLECTOMY     Patient Active Problem List   Diagnosis Date Noted   Hyperuricemia 03/08/2020   Senile nuclear sclerosis, bilateral 02/02/2020   Tinea corporis 07/22/2016   Elevated PSA 07/13/2015   Allergic rhinitis 03/08/2015   BPH (benign prostatic hyperplasia) 03/08/2015   Chronic kidney disease (CKD), stage III (moderate) (HCC) 03/08/2015   Essential (primary) hypertension 03/08/2015   Gouty arthritis 03/08/2015   H/O alcohol abuse 03/08/2015   Hyperlipidemia 03/08/2015   Hypothyroid 03/08/2015   Primary open angle glaucoma of both eyes, severe stage 03/08/2015   Osteopenia 03/08/2015   Pernicious anemia 03/08/2015   Pruritus 03/08/2015   Psoriasiform dermatitis 04/18/2013   Other long term (current) drug therapy 03/15/2013   Polypharmacy 03/15/2013   Dermatophytosis of groin 08/02/2012   Dermatitis, eczematoid 05/17/2012    PCP: Dr. Clydie Braun    REFERRING PROVIDER: Dr.   Cindi Carbon DIAG:  [phone] (ICD-10-CM) - Chronic right shoulder pain M25.512,G89.29 (ICD-10-CM) - Chronic left shoulder pain    THERAPY DIAG:  Chronic right shoulder pain  Chronic left shoulder pain  Rationale for Evaluation and Treatment: Rehabilitation  ONSET DATE: 08/2022   SUBJECTIVE:                                                                                                                                                                                      SUBJECTIVE STATEMENT: Pt states that he continues to feel pain and discomfort when horizontally abducting his shoulders. Otherwise, he has been doing well and he feels that exercises are helping somewhat.    Hand dominance: Right  PERTINENT HISTORY: Shoulders started hurting 9 months ago and came on with insidious onset.   Pt reports that he  received cortisone shots in both his shoulders and he is now feeling pain again. 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 Pt reports also night sweats that have been worsening, but he does not feel shoulder pain at night unless he lays on his sides.   PAIN:  Are you having pain? Yes: NPRS scale: 3/10 Pain location: On top of AC joint  Pain description: Achy Aggravating factors: Abducting, flexing and externally rotating overhead  Relieving factors: Keep shoulders down by his side and not laying on his shoulders  PRECAUTIONS: None  RED FLAGS: None   WEIGHT BEARING RESTRICTIONS: No  FALLS:  Has patient fallen in last 6 months? No  LIVING ENVIRONMENT: Lives with: lives with their spouse Lives in: House/apartment Stairs: Yes: Internal: 16 steps; on right going up and External: 2 steps; none Has following equipment at home: None  OCCUPATION: Retired   PLOF: Independent  PATIENT GOALS: Patient wants to return to lifting weight   NEXT MD VISIT: February 14th, 2025   OBJECTIVE:  Note: Objective measures were completed at Evaluation unless otherwise noted.  VITALS BP 141/68 HR 70 SpO2 100%  DIAGNOSTIC FINDINGS:  No imaging listed in charts   PATIENT SURVEYS:  Quick Dash including Sports Module= 28%   COGNITION: Overall cognitive status: Within functional limits for tasks assessed     SENSATION: WFL  POSTURE: Forward head and rounded shoulders   UPPER EXTREMITY ROM:                                                                   Cervical  AROM                                            Flex    45                                           Ext      45                        Lat Side Bend R/L 45/45                       Rotation       R/L     60/60  Active ROM Right eval Left eval  Shoulder flexion 160* 160*  Shoulder extension    Shoulder abduction 160* 160*  Shoulder adduction    Shoulder  internal rotation 70 70  Shoulder external rotation 90 90  Elbow flexion    Elbow extension    Wrist flexion    Wrist extension    Wrist ulnar deviation    Wrist radial deviation    Wrist pronation    Wrist supination    (Blank rows = not tested)                                               UPPER EXTREMITY MMT:  MMT Right eval Left eval  Shoulder flexion 4 4  Shoulder extension 4 4  Shoulder abduction 4 4  Shoulder adduction    Shoulder internal rotation 4 4  Shoulder external rotation 4-* 4-*  Middle trapezius 4 4  Lower trapezius 4- 4  Elbow flexion    Elbow extension    Wrist flexion    Wrist extension    Wrist ulnar deviation    Wrist radial deviation    Wrist pronation    Wrist supination    Grip strength (lbs)    (Blank rows = not tested)  SHOULDER SPECIAL TESTS: Impingement tests: Neer impingement test: NT, Hawkins/Kennedy impingement test: NT, and Painful arc test: NT  Rotator cuff assessment: Full can test: positive , Belly press test: negative, and Infraspinatus test: positive  Biceps assessment: Speed's test: negative  JOINT MOBILITY TESTING:  Not tested    PALPATION:  AC joint TTP                                                                                                                              TREATMENT DATE:  08/02/22: THEREX  UBE with seat at 10 with resistance 2- 2.5 forward and 2.5  min backward  OMEGA Seated Rows #35 1 x 10  OMEGA Seated Rows #45 2 x 10  OMEGA Lat Pull Down #45 3 x 10  Biceps Load 1: Negative Bilaterally  Biceps Load 2: Positive on Left    07/20/23: THEREX  UBE with seat at 10- 2.5 forward and 2.5 min backward  OMEGA Seated Rows #35 1 x 10  OMEGA Seated Rows #45 2 x 10  OMEGA Lat Pulldown #35 3 x 10  Shoulder Flexion to Horizontal #3 DB 1 x 10  Shoulder Flexion #3  DB 3 x 10  -Pt reports NRPS of 3/10  Standing Horizontal Abduction 1 x 10  -Pt reports NRPS of 4/10  Bent Over T's 2 x 10 Bent Over  T's with #3 DB 2 x 10   -Pt reports no increase in his pain  07/14/23: THEREX  UBE with seat at 10- 2.5 forward and 2.5 min backward  OMEGA Seated Rows #25 1 x 10  OMEGA Seated Rows #35 1 x 10  OMEGA Seated Rows #45 1 x 10  OMEGA Lat Pulldown #20 1 x 20  OMEGA Lat Pulldown #25 1 x 10  OMEGA Lat Pulldown #35 1 x 10  Doorway  Stretch 45 deg 1 x 30 sec  Doorway Stretch 90 deg 1 x 30 sec  Doorway Stretch 120 deg 1 x 30 sec  Supine Pec Stretch with hands in ER 2 x 60 sec     Eval (06/30/23) Shoulder Flexion/Extension AAROM 1 x 30  Shoulder Abduction/Adduction AAROM 1 x 30    PATIENT EDUCATION: Education details: Form and technique for correct performance of exercise and explanation of rotator cuff pathology.  Person educated: Patient Education method: Explanation, Demonstration, Verbal cues, and Handouts Education comprehension: verbalized understanding, returned demonstration, verbal cues required, and tactile cues required  HOME EXERCISE PROGRAM:  Access Code: [email or web address] URL: [email or web address] Date: 08/02/2023 Prepared by: Ellin Goodie  Exercises - Seated Shoulder Flexion AAROM with Pulley Behind  - 1 x daily - 7 x weekly - 30 reps - Seated Shoulder Abduction AAROM with Pulley Behind  - 1 x daily - 7 x weekly - 30 reps - Supine Thoracic Mobilization Towel Roll Vertical with Arm Stretch  - 3-4 x weekly - 3 reps - 60 sec  hold - Shoulder External Rotation and Scapular Retraction with Resistance  - 3-4 x weekly - 3 sets - 10 reps - Shoulder Abduction with Dumbbells - Thumbs Up  - 3-4 x weekly - 2 sets - 10 reps - 5 sec hold - Seated Thoracic Lumbar Extension with Pectoralis Stretch  - 1 x daily - 3 reps - 60 sec hold - Standing Shoulder Horizontal Abduction with Resistance  - 3-4 x weekly - 3 sets - 10 reps - Chest Press with Resistance  - 3-4 x weekly - 3 sets - 10 reps   ASSESSMENT:  CLINICAL IMPRESSION: Pt shows improvement with shoulder horizontal  abduction with modification to include increased flexion to decrease lever arm size. He continues to experience ongoing anterior knee pain that appears to be from rotator cuff tendinitis with labral pathology being ruled out during this visit. He continues to complete firing gun and playing golf with some increase in pain. He will continue to benefit from skilled PT to address the aforementioned deficits to improve overhead shoulder movements to lift gun for shooting and lift weights for recreational fitness.  OBJECTIVE IMPAIRMENTS: decreased ROM, decreased strength, impaired UE functional use, postural dysfunction, and pain.  ACTIVITY LIMITATIONS: carrying, lifting, toileting, dressing, and reach over head  PARTICIPATION LIMITATIONS: yard work and recreation: golf   PERSONAL FACTORS: Age, Time since onset of injury/illness/exacerbation, and 3+ comorbidities: HTN, Gout, and Hypothyroidism  are also affecting patient's functional outcome.   REHAB POTENTIAL: Good  CLINICAL DECISION MAKING: Stable/uncomplicated  EVALUATION COMPLEXITY: Low   GOALS: Goals reviewed with patient? No  SHORT TERM GOALS: Target date: 07/14/2023  Patient will demonstrate undestanding of home exercise plan by performing exercises correctly with evidence of good carry over with min to no verbal or tactile cues.  Baseline: NT 07/14/23: Performing independently  Goal status: ACHIEVED     LONG TERM GOALS: Target date: 09/08/2023  Patient improve QuickDash by >=8 pts as evidence of a statistically significant improvement in his shoulder function to better perform recreational tasks like holding gun and lifting weights.   Baseline: 28 pts Goal status: ONGOING   2.  Patient will improve bilateral shoulder and periscapular strength by 1/3 grade MMT (4- to 4) for improved shoulder function to better perform recreational tasks like holding gun and lifting weights.   Baseline: Shoulder Flex R/L 4/4, Shoulder Abd R/L 4/4,  Shoulder ER R/L 4-/4-, Shoulder IR R/L 4/4, Lower Trap R/L 4-/4, Mid Trap R/L 4/4                                                                                                             Goal status: ONGOING   3.  Patient will be able to perform right shoulder flexion isometric hold for >=2 min to be able to hold and shoot gun. Baseline:  NT  Goal status: ONGOING     PLAN:  PT FREQUENCY: 1-2x/week  PT DURATION: 10 weeks  PLANNED INTERVENTIONS: 97164- PT Re-evaluation, 97110-Therapeutic exercises, 97530- Therapeutic activity, [id number]- Neuromuscular re-education, 97140- Manual therapy, [id number]- Aquatic Therapy, 97014- Electrical stimulation (unattended), [id number]- Electrical stimulation (manual), 32440- Traction (mechanical), Taping, Dry Needling, Joint mobilization, Joint manipulation, Spinal manipulation, Spinal mobilization, Cryotherapy, and Moist heat  PLAN FOR NEXT SESSION: Reassess goals. Test shoulder isometric shoulder abduction and flexion . Continue with  Bicep curls to press ups, serratus push ups, face pulls or lower trap setting.      Ellin Goodie PT, DPT  [facility] [address], [place], [address] Phone: [phone]   Fax:  [phone]

## 2023-08-04 ENCOUNTER — Ambulatory Visit: Payer: PPO | Admitting: Physical Therapy

## 2023-08-04 DIAGNOSIS — G8929 Other chronic pain: Secondary | ICD-10-CM

## 2023-08-04 DIAGNOSIS — M25511 Pain in right shoulder: Secondary | ICD-10-CM | POA: Diagnosis not present

## 2023-08-04 NOTE — Therapy (Addendum)
 OUTPATIENT PHYSICAL THERAPY SHOULDER TREATMENT    Patient Name: Jacob LANDRESS Sr. MRN: [id number] DOB:[birth year], 89 y.o., male Today's Date: 08/04/2023  END OF SESSION:  PT End of Session - 08/04/23 0817     Visit Number 5    Number of Visits 20    Date for PT Re-Evaluation 09/08/23    Authorization Type [id number]    Authorization - Visit Number 5    Authorization - Number of Visits 20    Progress Note Due on Visit 10    PT Start Time 0815    PT Stop Time 0900    PT Time Calculation (min) 45 min    Activity Tolerance Patient limited by pain    Behavior During Therapy WFL for tasks assessed/performed              Past Medical History:  Diagnosis Date   Arthritis    right hand   BPH (benign prostatic hyperplasia)    CKD (chronic kidney disease), stage III (HCC)    COVID-19 05/2020   ED (erectile dysfunction)    Elevated PSA    GERD (gastroesophageal reflux disease)    occasional   Glaucoma    Gout    Gout    History of alcoholism (HCC)    History of chicken pox    History of measles    History of mumps    History of pernicious anemia    HTN (hypertension)    controlled   Hypothyroidism    Wears partial dentures    upper and lower    Past Surgical History:  Procedure Laterality Date   CATARACT EXTRACTION W/PHACO Right 10/08/2020   Procedure: CATARACT EXTRACTION PHACO AND INTRAOCULAR LENS PLACEMENT (IOC) RIGHT OMIDRIA;  Surgeon: Galen Manila, MD;  Location: [facility];  Service: Ophthalmology;  Laterality: Right;  CDE 27.87 02:08.6 minutes   CATARACT EXTRACTION W/PHACO Left 10/22/2020   Procedure: CATARACT EXTRACTION PHACO AND INTRAOCULAR LENS PLACEMENT (IOC) LEFT;  Surgeon: Galen Manila, MD;  Location: [facility];  Service: Ophthalmology;  Laterality: Left;  7.97 00:50.1   EYE SURGERY Right 10/24/2019   laser eye surgery on right eye for glaucoma   PARATHYROIDECTOMY  2004   PHOTOCOAGULATION WITH LASER Right 10/07/2015   Procedure:  PHOTOCOAGULATION WITH LASER RIGHT EYE TRANS SCLERAL;  Surgeon: Sherald Hess, MD;  Location: [facility];  Service: Ophthalmology;  Laterality: Right;   TONSILLECTOMY     Patient Active Problem List   Diagnosis Date Noted   Hyperuricemia 03/08/2020   Senile nuclear sclerosis, bilateral 02/02/2020   Tinea corporis 07/22/2016   Elevated PSA 07/13/2015   Allergic rhinitis 03/08/2015   BPH (benign prostatic hyperplasia) 03/08/2015   Chronic kidney disease (CKD), stage III (moderate) (HCC) 03/08/2015   Essential (primary) hypertension 03/08/2015   Gouty arthritis 03/08/2015   H/O alcohol abuse 03/08/2015   Hyperlipidemia 03/08/2015   Hypothyroid 03/08/2015   Primary open angle glaucoma of both eyes, severe stage 03/08/2015   Osteopenia 03/08/2015   Pernicious anemia 03/08/2015   Pruritus 03/08/2015   Psoriasiform dermatitis 04/18/2013   Other long term (current) drug therapy 03/15/2013   Polypharmacy 03/15/2013   Dermatophytosis of groin 08/02/2012   Dermatitis, eczematoid 05/17/2012    PCP: Dr. Clydie Braun    REFERRING PROVIDER: Dr.   Cindi Carbon DIAG:  [phone] (ICD-10-CM) - Chronic right shoulder pain M25.512,G89.29 (ICD-10-CM) - Chronic left shoulder pain    THERAPY DIAG:  Chronic right shoulder pain  Chronic left shoulder pain  Rationale for Evaluation and Treatment: Rehabilitation  ONSET DATE: 08/2022   SUBJECTIVE:                                                                                                                                                                                      SUBJECTIVE STATEMENT: Pt reports that he feels that his shoulder pain continues to improve and that the only time he felt it recently was when pressing a can opener with his thumb.    Hand dominance: Right  PERTINENT HISTORY: Shoulders started hurting 9 months ago and came on with insidious onset.   Pt reports that he received cortisone shots in  both his shoulders and he is now feeling pain again. 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 Pt reports also night sweats that have been worsening, but he does not feel shoulder pain at night unless he lays on his sides.   PAIN:  Are you having pain? Yes: NPRS scale: 3/10 Pain location: On top of AC joint  Pain description: Achy Aggravating factors: Abducting, flexing and externally rotating overhead  Relieving factors: Keep shoulders down by his side and not laying on his shoulders  PRECAUTIONS: None  RED FLAGS: None   WEIGHT BEARING RESTRICTIONS: No  FALLS:  Has patient fallen in last 6 months? No  LIVING ENVIRONMENT: Lives with: lives with their spouse Lives in: House/apartment Stairs: Yes: Internal: 16 steps; on right going up and External: 2 steps; none Has following equipment at home: None  OCCUPATION: Retired   PLOF: Independent  PATIENT GOALS: Patient wants to return to lifting weight   NEXT MD VISIT: February 14th, 2025   OBJECTIVE:  Note: Objective measures were completed at Evaluation unless otherwise noted.  VITALS BP 141/68 HR 70 SpO2 100%  DIAGNOSTIC FINDINGS:  No imaging listed in charts   PATIENT SURVEYS:  Quick Dash including Sports Module= 28%   COGNITION: Overall cognitive status: Within functional limits for tasks assessed     SENSATION: WFL  POSTURE: Forward head and rounded shoulders   UPPER EXTREMITY ROM:                                                                   Cervical  AROM                                            Flex    45                                           Ext      45                        Lat Side Bend R/L 45/45                       Rotation       R/L     60/60  Active ROM Right eval Left eval  Shoulder flexion 160* 160*  Shoulder extension    Shoulder abduction 160* 160*  Shoulder adduction    Shoulder internal rotation 70 70   Shoulder external rotation 90 90  Elbow flexion    Elbow extension    Wrist flexion    Wrist extension    Wrist ulnar deviation    Wrist radial deviation    Wrist pronation    Wrist supination    (Blank rows = not tested)                                               UPPER EXTREMITY MMT:  MMT Right eval Left eval  Shoulder flexion 4 4  Shoulder extension 4 4  Shoulder abduction 4 4  Shoulder adduction    Shoulder internal rotation 4 4  Shoulder external rotation 4-* 4-*  Middle trapezius 4 4  Lower trapezius 4- 4  Elbow flexion    Elbow extension    Wrist flexion    Wrist extension    Wrist ulnar deviation    Wrist radial deviation    Wrist pronation    Wrist supination    Grip strength (lbs)    (Blank rows = not tested)  SHOULDER SPECIAL TESTS: Impingement tests: Neer impingement test: NT, Hawkins/Kennedy impingement test: NT, and Painful arc test: NT  Rotator cuff assessment: Full can test: positive , Belly press test: negative, and Infraspinatus test: positive  Biceps assessment: Speed's test: negative  JOINT MOBILITY TESTING:  Not tested    PALPATION:  AC joint TTP                                                                                                                              TREATMENT DATE:  08/04/23: THEREX  UBE with seat at 10 with resistance 2- 2.5 forward and 2.5  min backward  OMEGA Seated Rows #45 3 x 10  OMEGA Lat Pulldowns #45 3 x 10  Shoulder Flexion #3 DB 3 x 10  Shoulder Scaption #3 DB 3 x 10  -Able to reach 120 deg  Thoracic Extension with Chest Stretch 1 x 10 with 5 sec hold  -Pt reports increased shoulder pain  Thoracic Extension without arms across chest and not overhead 1 x 10 with 5 sec hold   08/02/22: THEREX  UBE with seat at 10 with resistance 2- 2.5 forward and 2.5 min backward  OMEGA Seated Rows #35 1 x 10  OMEGA Seated Rows #45 2 x 10  OMEGA Lat Pull Down #45 3 x 10  Biceps Load 1: Negative Bilaterally   Biceps Load 2: Positive on Left  07/20/23: THEREX  UBE with seat at 10- 2.5 forward and 2.5 min backward  OMEGA Seated Rows #35 1 x 10  OMEGA Seated Rows #45 2 x 10  OMEGA Lat Pulldown #35 3 x 10  Shoulder Flexion to Horizontal #3 DB 1 x 10  Shoulder Flexion #3  DB 3 x 10  -Pt reports NRPS of 3/10  Standing Horizontal Abduction 1 x 10  -Pt reports NRPS of 4/10  Bent Over T's 2 x 10 Bent Over T's with #3 DB 2 x 10   -Pt reports no increase in his pain  07/14/23: THEREX  UBE with seat at 10- 2.5 forward and 2.5 min backward  OMEGA Seated Rows #25 1 x 10  OMEGA Seated Rows #35 1 x 10  OMEGA Seated Rows #45 1 x 10  OMEGA Lat Pulldown #20 1 x 20  OMEGA Lat Pulldown #25 1 x 10  OMEGA Lat Pulldown #35 1 x 10  Doorway  Stretch 45 deg 1 x 30 sec  Doorway Stretch 90 deg 1 x 30 sec  Doorway Stretch 120 deg 1 x 30 sec  Supine Pec Stretch with hands in ER 2 x 60 sec     Eval (06/30/23) Shoulder Flexion/Extension AAROM 1 x 30  Shoulder Abduction/Adduction AAROM 1 x 30    PATIENT EDUCATION: Education details: Form and technique for correct performance of exercise and explanation of rotator cuff pathology.  Person educated: Patient Education method: Explanation, Demonstration, Verbal cues, and Handouts Education comprehension: verbalized understanding, returned demonstration, verbal cues required, and tactile cues required  HOME EXERCISE PROGRAM:  Access Code: [email or web address] URL: [email or web address] Date: 08/04/2023 Prepared by: Ellin Goodie  Exercises - Seated Shoulder Flexion AAROM with Pulley Behind  - 1 x daily - 7 x weekly - 30 reps - Seated Shoulder Abduction AAROM with Pulley Behind  - 1 x daily - 7 x weekly - 30 reps - Supine Thoracic Mobilization Towel Roll Vertical with Arm Stretch  - 3-4 x weekly - 3 reps - 60 sec  hold - Shoulder External Rotation and Scapular Retraction with Resistance  - 3-4 x weekly - 3 sets - 10 reps - Shoulder Abduction with  Dumbbells - Thumbs Up  - 3-4 x weekly - 2 sets - 10 reps - 5 sec hold - Seated Thoracic Lumbar Extension with Pectoralis Stretch  - 1 x daily - 2 sets - 10 reps - 3-5 sec hold - Standing Shoulder Horizontal Abduction with Resistance  - 3-4 x weekly - 3 sets - 10 reps - Chest Press with Resistance  - 3-4 x weekly - 3 sets - 10 reps   ASSESSMENT:  CLINICAL IMPRESSION: Pt continues to show improvement with pain tolerance  to activity with ability to perform shoulder abduction past 90 degrees for the first time with minimal pain increase. He does show signs of hyperkyphosis with increased thoracic pain with extension. Focused on increased thoracic extension within pain free range with patient being able to perform thoracic extension exercises. Session terminated early because pt had to make another apt and needed to leave earlier. He will continue to benefit from skilled PT to address the aforementioned deficits to improve overhead shoulder movements to lift gun for shooting and lift weights for recreational fitness.   OBJECTIVE IMPAIRMENTS: decreased ROM, decreased strength, impaired UE functional use, postural dysfunction, and pain.   ACTIVITY LIMITATIONS: carrying, lifting, toileting, dressing, and reach over head  PARTICIPATION LIMITATIONS: yard work and recreation: golf   PERSONAL FACTORS: Age, Time since onset of injury/illness/exacerbation, and 3+ comorbidities: HTN, Gout, and Hypothyroidism  are also affecting patient's functional outcome.   REHAB POTENTIAL: Good  CLINICAL DECISION MAKING: Stable/uncomplicated  EVALUATION COMPLEXITY: Low   GOALS: Goals reviewed with patient? No  SHORT TERM GOALS: Target date: 07/14/2023  Patient will demonstrate undestanding of home exercise plan by performing exercises correctly with evidence of good carry over with min to no verbal or tactile cues.  Baseline: NT 07/14/23: Performing independently  Goal status: ACHIEVED     LONG TERM GOALS:  Target date: 09/08/2023  Patient improve QuickDash by >=8 pts as evidence of a statistically significant improvement in his shoulder function to better perform recreational tasks like holding gun and lifting weights.   Baseline: 28 pts Goal status: ONGOING   2.  Patient will improve bilateral shoulder and periscapular strength by 1/3 grade MMT (4- to 4) for improved shoulder function to better perform recreational tasks like holding gun and lifting weights.   Baseline: Shoulder Flex R/L 4/4, Shoulder Abd R/L 4/4, Shoulder ER R/L 4-/4-, Shoulder IR R/L 4/4, Lower Trap R/L 4-/4, Mid Trap R/L 4/4                                                                                                             Goal status: ONGOING   3.  Patient will be able to perform right shoulder flexion isometric hold for >=2 min to be able to hold and shoot gun. Baseline:  NT  Goal status: ONGOING     PLAN:  PT FREQUENCY: 1-2x/week  PT DURATION: 10 weeks  PLANNED INTERVENTIONS: 97164- PT Re-evaluation, 97110-Therapeutic exercises, 97530- Therapeutic activity, [id number]- Neuromuscular re-education, 97140- Manual therapy, [id number]- Aquatic Therapy, 97014- Electrical stimulation (unattended), [id number]- Electrical stimulation (manual), 82956- Traction (mechanical), Taping, Dry Needling, Joint mobilization, Joint manipulation, Spinal manipulation, Spinal mobilization, Cryotherapy, and Moist heat  PLAN FOR NEXT SESSION: Reassess goals. Continue with  Bicep curls to press ups, serratus push ups, face pulls or lower trap setting.      Ellin Goodie PT, DPT  [facility] [address] [place], [address] Phone: [phone]   Fax:  [phone]

## 2023-08-10 ENCOUNTER — Ambulatory Visit: Payer: PPO | Admitting: Physical Therapy

## 2023-08-10 DIAGNOSIS — M25511 Pain in right shoulder: Secondary | ICD-10-CM | POA: Diagnosis not present

## 2023-08-10 DIAGNOSIS — G8929 Other chronic pain: Secondary | ICD-10-CM

## 2023-08-10 NOTE — Therapy (Signed)
 OUTPATIENT PHYSICAL THERAPY SHOULDER TREATMENT    Patient Name: Jacob VANBERGEN Sr. MRN: [id number] DOB:[birth year], 89 y.o., male Today's Date: 08/10/2023  END OF SESSION:  PT End of Session - 08/10/23 1408     Visit Number 6    Number of Visits 20    Date for PT Re-Evaluation 09/08/23    Authorization Type [id number]    Authorization - Visit Number 6    Authorization - Number of Visits 20    Progress Note Due on Visit 10    PT Start Time 1347    PT Stop Time 1430    PT Time Calculation (min) 43 min    Activity Tolerance Patient limited by pain    Behavior During Therapy WFL for tasks assessed/performed               Past Medical History:  Diagnosis Date   Arthritis    right hand   BPH (benign prostatic hyperplasia)    CKD (chronic kidney disease), stage III (HCC)    COVID-19 05/2020   ED (erectile dysfunction)    Elevated PSA    GERD (gastroesophageal reflux disease)    occasional   Glaucoma    Gout    Gout    History of alcoholism (HCC)    History of chicken pox    History of measles    History of mumps    History of pernicious anemia    HTN (hypertension)    controlled   Hypothyroidism    Wears partial dentures    upper and lower    Past Surgical History:  Procedure Laterality Date   CATARACT EXTRACTION W/PHACO Right 10/08/2020   Procedure: CATARACT EXTRACTION PHACO AND INTRAOCULAR LENS PLACEMENT (IOC) RIGHT OMIDRIA;  Surgeon: Galen Manila, MD;  Location: [facility];  Service: Ophthalmology;  Laterality: Right;  CDE 27.87 02:08.6 minutes   CATARACT EXTRACTION W/PHACO Left 10/22/2020   Procedure: CATARACT EXTRACTION PHACO AND INTRAOCULAR LENS PLACEMENT (IOC) LEFT;  Surgeon: Galen Manila, MD;  Location: [facility];  Service: Ophthalmology;  Laterality: Left;  7.97 00:50.1   EYE SURGERY Right 10/24/2019   laser eye surgery on right eye for glaucoma   PARATHYROIDECTOMY  2004   PHOTOCOAGULATION WITH LASER Right 10/07/2015    Procedure: PHOTOCOAGULATION WITH LASER RIGHT EYE TRANS SCLERAL;  Surgeon: Sherald Hess, MD;  Location: [facility];  Service: Ophthalmology;  Laterality: Right;   TONSILLECTOMY     Patient Active Problem List   Diagnosis Date Noted   Hyperuricemia 03/08/2020   Senile nuclear sclerosis, bilateral 02/02/2020   Tinea corporis 07/22/2016   Elevated PSA 07/13/2015   Allergic rhinitis 03/08/2015   BPH (benign prostatic hyperplasia) 03/08/2015   Chronic kidney disease (CKD), stage III (moderate) (HCC) 03/08/2015   Essential (primary) hypertension 03/08/2015   Gouty arthritis 03/08/2015   H/O alcohol abuse 03/08/2015   Hyperlipidemia 03/08/2015   Hypothyroid 03/08/2015   Primary open angle glaucoma of both eyes, severe stage 03/08/2015   Osteopenia 03/08/2015   Pernicious anemia 03/08/2015   Pruritus 03/08/2015   Psoriasiform dermatitis 04/18/2013   Other long term (current) drug therapy 03/15/2013   Polypharmacy 03/15/2013   Dermatophytosis of groin 08/02/2012   Dermatitis, eczematoid 05/17/2012    PCP: Dr. Clydie Braun    REFERRING PROVIDER: Dr.   Cindi Carbon DIAG:  M25.511,G89.29 (ICD-10-CM) - Chronic right shoulder pain M25.512,G89.29 (ICD-10-CM) - Chronic left shoulder pain    THERAPY DIAG:  No diagnosis found.  Rationale for Evaluation and  Treatment: Rehabilitation  ONSET DATE: 08/2022   SUBJECTIVE:                                                                                                                                                                                      SUBJECTIVE STATEMENT: Pt states that he felt increased pain in his neck by performing back extension. He recently got a new mattress which has been making his hip hurt more.    Hand dominance: Right  PERTINENT HISTORY: Shoulders started hurting 9 months ago and came on with insidious onset.   Pt reports that he received cortisone shots in both his shoulders and he is now  feeling pain again. 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 Pt reports also night sweats that have been worsening, but he does not feel shoulder pain at night unless he lays on his sides.   PAIN:  Are you having pain? Yes: NPRS scale: 3/10 Pain location: On top of AC joint  Pain description: Achy Aggravating factors: Abducting, flexing and externally rotating overhead  Relieving factors: Keep shoulders down by his side and not laying on his shoulders  PRECAUTIONS: None  RED FLAGS: None   WEIGHT BEARING RESTRICTIONS: No  FALLS:  Has patient fallen in last 6 months? No  LIVING ENVIRONMENT: Lives with: lives with their spouse Lives in: House/apartment Stairs: Yes: Internal: 16 steps; on right going up and External: 2 steps; none Has following equipment at home: None  OCCUPATION: Retired   PLOF: Independent  PATIENT GOALS: Patient wants to return to lifting weight   NEXT MD VISIT: February 14th, 2025   OBJECTIVE:  Note: Objective measures were completed at Evaluation unless otherwise noted.  VITALS BP 141/68 HR 70 SpO2 100%  DIAGNOSTIC FINDINGS:  No imaging listed in charts   PATIENT SURVEYS:  Quick Dash including Sports Module= 28%   COGNITION: Overall cognitive status: Within functional limits for tasks assessed     SENSATION: WFL  POSTURE: Forward head and rounded shoulders   UPPER EXTREMITY ROM:                                                                   Cervical                                AROM  Flex    45                                           Ext      45                        Lat Side Bend R/L 45/45                       Rotation       R/L     60/60  Active ROM Right eval Left eval  Shoulder flexion 160* 160*  Shoulder extension    Shoulder abduction 160* 160*  Shoulder adduction    Shoulder internal rotation 70 70  Shoulder external rotation 90 90  Elbow  flexion    Elbow extension    Wrist flexion    Wrist extension    Wrist ulnar deviation    Wrist radial deviation    Wrist pronation    Wrist supination    (Blank rows = not tested)                                               UPPER EXTREMITY MMT:  MMT Right eval Left eval  Shoulder flexion 4 4  Shoulder extension 4 4  Shoulder abduction 4 4  Shoulder adduction    Shoulder internal rotation 4 4  Shoulder external rotation 4-* 4-*  Middle trapezius 4 4  Lower trapezius 4- 4  Elbow flexion    Elbow extension    Wrist flexion    Wrist extension    Wrist ulnar deviation    Wrist radial deviation    Wrist pronation    Wrist supination    Grip strength (lbs)    (Blank rows = not tested)  SHOULDER SPECIAL TESTS: Impingement tests: Neer impingement test: NT, Hawkins/Kennedy impingement test: NT, and Painful arc test: NT  Rotator cuff assessment: Full can test: positive , Belly press test: negative, and Infraspinatus test: positive  Biceps assessment: Speed's test: negative  JOINT MOBILITY TESTING:  Not tested    PALPATION:  AC joint TTP                                                                                                                              TREATMENT DATE:  08/10/23: THEREX  UBE with seat at 10 with resistance 2- 2.5 forward and 2.5 min backward  OMEGA Seated Rows #45 3 x 10  OMEGA Lat Pulldowns #45 3 x 10  -min VC to increase lumbar extension to avoid shoulder impingement  Seated Thoracic Extension with arms behind head 2 x 10 with 5 sec hold  OMEGA Face Pulls #5 1 x 10  -mod VC  Face Pulls with yellow band   D2 Shoulder Flexion and Extension with yellow band 2 x 10  -Pt reports increased left shoulder pain  Lower Trap Setting 1 x 10  Lower Trap Setting with yellow band for resistance with abduction 2 x 10  Shoulder Horizontal Abduction with yellow band 1 x 10   08/04/23: THEREX  UBE with seat at 10 with resistance 2- 2.5 forward  and 2.5 min backward  OMEGA Seated Rows #45 3 x 10  OMEGA Lat Pulldowns #45 3 x 10  Shoulder Flexion #3 DB 3 x 10  Shoulder Scaption #3 DB 3 x 10  -Able to reach 120 deg  Thoracic Extension with Chest Stretch 1 x 10 with 5 sec hold  -Pt reports increased shoulder pain  Thoracic Extension without arms across chest and not overhead 2 x 10 with 5 sec hold  -min VC to maintain neutral neck posture   08/02/22: THEREX  UBE with seat at 10 with resistance 2- 2.5 forward and 2.5 min backward  OMEGA Seated Rows #35 1 x 10  OMEGA Seated Rows #45 2 x 10  OMEGA Lat Pull Down #45 3 x 10  Biceps Load 1: Negative Bilaterally  Biceps Load 2: Positive on Left  07/20/23: THEREX  UBE with seat at 10- 2.5 forward and 2.5 min backward  OMEGA Seated Rows #35 1 x 10  OMEGA Seated Rows #45 2 x 10  OMEGA Lat Pulldown #35 3 x 10  Shoulder Flexion to Horizontal #3 DB 1 x 10  Shoulder Flexion #3  DB 3 x 10  -Pt reports NRPS of 3/10  Standing Horizontal Abduction 1 x 10  -Pt reports NRPS of 4/10  Bent Over T's 2 x 10 Bent Over T's with #3 DB 2 x 10   -Pt reports no increase in his pain  07/14/23: THEREX  UBE with seat at 10- 2.5 forward and 2.5 min backward  OMEGA Seated Rows #25 1 x 10  OMEGA Seated Rows #35 1 x 10  OMEGA Seated Rows #45 1 x 10  OMEGA Lat Pulldown #20 1 x 20  OMEGA Lat Pulldown #25 1 x 10  OMEGA Lat Pulldown #35 1 x 10  Doorway  Stretch 45 deg 1 x 30 sec  Doorway Stretch 90 deg 1 x 30 sec  Doorway Stretch 120 deg 1 x 30 sec  Supine Pec Stretch with hands in ER 2 x 60 sec     Eval (06/30/23) Shoulder Flexion/Extension AAROM 1 x 30  Shoulder Abduction/Adduction AAROM 1 x 30    PATIENT EDUCATION: Education details: Form and technique for correct performance of exercise and explanation of rotator cuff pathology.  Person educated: Patient Education method: Explanation, Demonstration, Verbal cues, and Handouts Education comprehension: verbalized understanding, returned  demonstration, verbal cues required, and tactile cues required  HOME EXERCISE PROGRAM:  Access Code: [email or web address] URL: [email or web address] Date: 08/04/2023 Prepared by: Ellin Goodie  Exercises - Seated Shoulder Flexion AAROM with Pulley Behind  - 1 x daily - 7 x weekly - 30 reps - Seated Shoulder Abduction AAROM with Pulley Behind  - 1 x daily - 7 x weekly - 30 reps - Supine Thoracic Mobilization Towel Roll Vertical with Arm Stretch  - 3-4 x weekly - 3 reps - 60 sec  hold - Shoulder External Rotation and Scapular Retraction with Resistance  - 3-4 x weekly - 3 sets - 10 reps - Shoulder  Abduction with Dumbbells - Thumbs Up  - 3-4 x weekly - 2 sets - 10 reps - 5 sec hold - Seated Thoracic Lumbar Extension with Pectoralis Stretch  - 1 x daily - 2 sets - 10 reps - 3-5 sec hold - Standing Shoulder Horizontal Abduction with Resistance  - 3-4 x weekly - 3 sets - 10 reps - Chest Press with Resistance  - 3-4 x weekly - 3 sets - 10 reps   ASSESSMENT:  CLINICAL IMPRESSION: Pt demonstrates ongoing progression towards goals with improvement in shoulder and periscapular strength as evidenced by ability to complete overhead exercises without an increase in his shoulder pain. Modified HEP with increased verbal cues for patient to avoid cervical extension with thoracic mobility exercises. He will continue to benefit from skilled PT to address the aforementioned deficits to improve overhead shoulder movements to lift gun for shooting and lift weights for recreational fitness.   OBJECTIVE IMPAIRMENTS: decreased ROM, decreased strength, impaired UE functional use, postural dysfunction, and pain.   ACTIVITY LIMITATIONS: carrying, lifting, toileting, dressing, and reach over head  PARTICIPATION LIMITATIONS: yard work and recreation: golf   PERSONAL FACTORS: Age, Time since onset of injury/illness/exacerbation, and 3+ comorbidities: HTN, Gout, and Hypothyroidism  are also affecting patient's  functional outcome.   REHAB POTENTIAL: Good  CLINICAL DECISION MAKING: Stable/uncomplicated  EVALUATION COMPLEXITY: Low   GOALS: Goals reviewed with patient? No  SHORT TERM GOALS: Target date: 07/14/2023  Patient will demonstrate undestanding of home exercise plan by performing exercises correctly with evidence of good carry over with min to no verbal or tactile cues.  Baseline: NT 07/14/23: Performing independently  Goal status: ACHIEVED     LONG TERM GOALS: Target date: 09/08/2023  Patient improve QuickDash by >=8 pts as evidence of a statistically significant improvement in his shoulder function to better perform recreational tasks like holding gun and lifting weights.   Baseline: 28 pts Goal status: ONGOING   2.  Patient will improve bilateral shoulder and periscapular strength by 1/3 grade MMT (4- to 4) for improved shoulder function to better perform recreational tasks like holding gun and lifting weights.   Baseline: Shoulder Flex R/L 4/4, Shoulder Abd R/L 4/4, Shoulder ER R/L 4-/4-, Shoulder IR R/L 4/4, Lower Trap R/L 4-/4, Mid Trap R/L 4/4                                                                                                             Goal status: ONGOING   3.  Patient will be able to perform right shoulder flexion isometric hold for >=2 min to be able to hold and shoot gun. Baseline:  NT 08/10/23: Able to perform isometric hold to hold gun  Goal status: ACHIEVED     PLAN:  PT FREQUENCY: 1-2x/week  PT DURATION: 10 weeks  PLANNED INTERVENTIONS: 97164- PT Re-evaluation, 97110-Therapeutic exercises, 97530- Therapeutic activity, [id number]- Neuromuscular re-education, 97140- Manual therapy, [id number]- Aquatic Therapy, 97014- Electrical stimulation (unattended), [id number]- Electrical stimulation (manual), 09811- Traction (mechanical), Taping, Dry Needling, Joint mobilization, Joint manipulation, Spinal manipulation, Spinal  mobilization, Cryotherapy, and Moist heat  PLAN FOR  NEXT SESSION: Reassess shoulder MMT. Continue with  Bicep curls to press ups and serratus push ups.    Ellin Goodie PT, DPT  [facility] [address], [place], [address] Phone: [phone]   Fax:  [phone]

## 2023-08-12 ENCOUNTER — Ambulatory Visit: Payer: PPO | Admitting: Physical Therapy

## 2023-08-12 DIAGNOSIS — G8929 Other chronic pain: Secondary | ICD-10-CM

## 2023-08-12 DIAGNOSIS — M25511 Pain in right shoulder: Secondary | ICD-10-CM | POA: Diagnosis not present

## 2023-08-12 NOTE — Therapy (Signed)
 OUTPATIENT PHYSICAL THERAPY SHOULDER TREATMENT    Patient Name: Jacob TILLIS Sr. MRN: [id number] DOB:10/11/1933, 89 y.o., male Today's Date: 08/12/2023  END OF SESSION:  PT End of Session - 08/12/23 1512     Visit Number 7    Number of Visits 20    Date for PT Re-Evaluation 09/08/23    Authorization Type [id number]    Authorization - Visit Number 7    Authorization - Number of Visits 20    Progress Note Due on Visit 10    PT Start Time 1430    PT Stop Time 1510    PT Time Calculation (min) 40 min    Activity Tolerance Patient tolerated treatment well    Behavior During Therapy WFL for tasks assessed/performed                Past Medical History:  Diagnosis Date   Arthritis    right hand   BPH (benign prostatic hyperplasia)    CKD (chronic kidney disease), stage III (HCC)    COVID-19 05/2020   ED (erectile dysfunction)    Elevated PSA    GERD (gastroesophageal reflux disease)    occasional   Glaucoma    Gout    Gout    History of alcoholism (HCC)    History of chicken pox    History of measles    History of mumps    History of pernicious anemia    HTN (hypertension)    controlled   Hypothyroidism    Wears partial dentures    upper and lower    Past Surgical History:  Procedure Laterality Date   CATARACT EXTRACTION W/PHACO Right 10/08/2020   Procedure: CATARACT EXTRACTION PHACO AND INTRAOCULAR LENS PLACEMENT (IOC) RIGHT OMIDRIA;  Surgeon: Galen Manila, MD;  Location: [facility];  Service: Ophthalmology;  Laterality: Right;  CDE 27.87 02:08.6 minutes   CATARACT EXTRACTION W/PHACO Left 10/22/2020   Procedure: CATARACT EXTRACTION PHACO AND INTRAOCULAR LENS PLACEMENT (IOC) LEFT;  Surgeon: Galen Manila, MD;  Location: [facility];  Service: Ophthalmology;  Laterality: Left;  7.97 00:50.1   EYE SURGERY Right 10/24/2019   laser eye surgery on right eye for glaucoma   PARATHYROIDECTOMY  2004   PHOTOCOAGULATION WITH LASER Right 10/07/2015    Procedure: PHOTOCOAGULATION WITH LASER RIGHT EYE TRANS SCLERAL;  Surgeon: Sherald Hess, MD;  Location: [facility];  Service: Ophthalmology;  Laterality: Right;   TONSILLECTOMY     Patient Active Problem List   Diagnosis Date Noted   Hyperuricemia 03/08/2020   Senile nuclear sclerosis, bilateral 02/02/2020   Tinea corporis 07/22/2016   Elevated PSA 07/13/2015   Allergic rhinitis 03/08/2015   BPH (benign prostatic hyperplasia) 03/08/2015   Chronic kidney disease (CKD), stage III (moderate) (HCC) 03/08/2015   Essential (primary) hypertension 03/08/2015   Gouty arthritis 03/08/2015   H/O alcohol abuse 03/08/2015   Hyperlipidemia 03/08/2015   Hypothyroid 03/08/2015   Primary open angle glaucoma of both eyes, severe stage 03/08/2015   Osteopenia 03/08/2015   Pernicious anemia 03/08/2015   Pruritus 03/08/2015   Psoriasiform dermatitis 04/18/2013   Other long term (current) drug therapy 03/15/2013   Polypharmacy 03/15/2013   Dermatophytosis of groin 08/02/2012   Dermatitis, eczematoid 05/17/2012    PCP: Dr. Clydie Braun    REFERRING PROVIDER: Dr.   Cindi Carbon DIAG:  M25.511,G89.29 (ICD-10-CM) - Chronic right shoulder pain M25.512,G89.29 (ICD-10-CM) - Chronic left shoulder pain    THERAPY DIAG:  No diagnosis found.  Rationale for Evaluation  and Treatment: Rehabilitation  ONSET DATE: 08/2022   SUBJECTIVE:                                                                                                                                                                                      SUBJECTIVE STATEMENT: Pt states that he felt increased pain in his neck by performing back extension. He recently got a new mattress which has been making his hip hurt more.    Hand dominance: Right  PERTINENT HISTORY: Shoulders started hurting 9 months ago and came on with insidious onset.   Pt reports that he received cortisone shots in both his shoulders and he is  now feeling pain again. 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 Pt reports also night sweats that have been worsening, but he does not feel shoulder pain at night unless he lays on his sides.   PAIN:  Are you having pain? Yes: NPRS scale: 3/10 Pain location: On top of AC joint  Pain description: Achy Aggravating factors: Abducting, flexing and externally rotating overhead  Relieving factors: Keep shoulders down by his side and not laying on his shoulders  PRECAUTIONS: None  RED FLAGS: None   WEIGHT BEARING RESTRICTIONS: No  FALLS:  Has patient fallen in last 6 months? No  LIVING ENVIRONMENT: Lives with: lives with their spouse Lives in: House/apartment Stairs: Yes: Internal: 16 steps; on right going up and External: 2 steps; none Has following equipment at home: None  OCCUPATION: Retired   PLOF: Independent  PATIENT GOALS: Patient wants to return to lifting weight   NEXT MD VISIT: February 14th, 2025   OBJECTIVE:  Note: Objective measures were completed at Evaluation unless otherwise noted.  VITALS BP 141/68 HR 70 SpO2 100%  DIAGNOSTIC FINDINGS:  No imaging listed in charts   PATIENT SURVEYS:  Quick Dash including Sports Module= 28%   COGNITION: Overall cognitive status: Within functional limits for tasks assessed     SENSATION: WFL  POSTURE: Forward head and rounded shoulders   UPPER EXTREMITY ROM:                                                                   Cervical                                AROM  Flex    45                                           Ext      45                        Lat Side Bend R/L 45/45                       Rotation       R/L     60/60  Active ROM Right eval Left eval  Shoulder flexion 160* 160*  Shoulder extension    Shoulder abduction 160* 160*  Shoulder adduction    Shoulder internal rotation 70 70  Shoulder external rotation 90 90   Elbow flexion    Elbow extension    Wrist flexion    Wrist extension    Wrist ulnar deviation    Wrist radial deviation    Wrist pronation    Wrist supination    (Blank rows = not tested)                                               UPPER EXTREMITY MMT:  MMT Right eval Left eval  Shoulder flexion 4 4  Shoulder extension 4 4  Shoulder abduction 4 4  Shoulder adduction    Shoulder internal rotation 4 4  Shoulder external rotation 4-* 4-*  Middle trapezius 4 4  Lower trapezius 4- 4  Elbow flexion    Elbow extension    Wrist flexion    Wrist extension    Wrist ulnar deviation    Wrist radial deviation    Wrist pronation    Wrist supination    Grip strength (lbs)    (Blank rows = not tested)  SHOULDER SPECIAL TESTS: Impingement tests: Neer impingement test: NT, Hawkins/Kennedy impingement test: NT, and Painful arc test: NT  Rotator cuff assessment: Full can test: positive , Belly press test: negative, and Infraspinatus test: positive  Biceps assessment: Speed's test: negative  JOINT MOBILITY TESTING:  Not tested    PALPATION:  AC joint TTP                                                                                                                              TREATMENT DATE:  08/12/23: THEREX  UBE with seat at 10 with resistance 2- 2.5 forward and 2.5 min backward  OMEGA Seated Rows #45 3 x 10  OMEGA Lat Pulldowns #45 3 x 10  Shoulder Horizontal Abduction at 90 deg  with red band 3 x 10  -min VC to keep elbows extending and to keep from shrugging  shoulders  Shoulder ER at 0 deg abduction on LUE with yellow band 1 x 10  -Pt reports increase in pain on LUE  Shoulder ER at 0 deg abduction on LUE with #1 DB 1 x 10  Shoulder ER at 0 deg abduction on LUE with #2 DB 2 x 10   Self-Care  Explanation about  shoulder impingement and to avoid reaching forward to weed garden and to avoid external rotation with exercise like D2 PNF flexion and when performing  overhead functional activities.    08/10/23: THEREX  UBE with seat at 10 with resistance 2- 2.5 forward and 2.5 min backward  OMEGA Seated Rows #45 3 x 10  OMEGA Lat Pulldowns #45 3 x 10  -min VC to increase lumbar extension to avoid shoulder impingement  Seated Thoracic Extension with arms behind head 2 x 10 with 5 sec hold  OMEGA Face Pulls #5 1 x 10  -mod VC  Face Pulls with yellow band   D2 Shoulder Flexion and Extension with yellow band 2 x 10  -Pt reports increased left shoulder pain  Lower Trap Setting 1 x 10  Lower Trap Setting with yellow band for resistance with abduction 2 x 10  Shoulder Horizontal Abduction with yellow band 1 x 10   08/04/23: THEREX  UBE with seat at 10 with resistance 2- 2.5 forward and 2.5 min backward  OMEGA Seated Rows #45 3 x 10  OMEGA Lat Pulldowns #45 3 x 10  Shoulder Flexion #3 DB 3 x 10  Shoulder Scaption #3 DB 3 x 10  -Able to reach 120 deg  Thoracic Extension with Chest Stretch 1 x 10 with 5 sec hold  -Pt reports increased shoulder pain  Thoracic Extension without arms across chest and not overhead 2 x 10 with 5 sec hold  -min VC to maintain neutral neck posture   08/02/22: THEREX  UBE with seat at 10 with resistance 2- 2.5 forward and 2.5 min backward  OMEGA Seated Rows #35 1 x 10  OMEGA Seated Rows #45 2 x 10  OMEGA Lat Pull Down #45 3 x 10  Biceps Load 1: Negative Bilaterally  Biceps Load 2: Positive on Left  07/20/23: THEREX  UBE with seat at 10- 2.5 forward and 2.5 min backward  OMEGA Seated Rows #35 1 x 10  OMEGA Seated Rows #45 2 x 10  OMEGA Lat Pulldown #35 3 x 10  Shoulder Flexion to Horizontal #3 DB 1 x 10  Shoulder Flexion #3  DB 3 x 10  -Pt reports NRPS of 3/10  Standing Horizontal Abduction 1 x 10  -Pt reports NRPS of 4/10  Bent Over T's 2 x 10 Bent Over T's with #3 DB 2 x 10   -Pt reports no increase in his pain  07/14/23: THEREX  UBE with seat at 10- 2.5 forward and 2.5 min backward  OMEGA Seated Rows #25 1 x  10  OMEGA Seated Rows #35 1 x 10  OMEGA Seated Rows #45 1 x 10  OMEGA Lat Pulldown #20 1 x 20  OMEGA Lat Pulldown #25 1 x 10  OMEGA Lat Pulldown #35 1 x 10  Doorway  Stretch 45 deg 1 x 30 sec  Doorway Stretch 90 deg 1 x 30 sec  Doorway Stretch 120 deg 1 x 30 sec  Supine Pec Stretch with hands in ER 2 x 60 sec     Eval (06/30/23) Shoulder Flexion/Extension AAROM 1 x 30  Shoulder Abduction/Adduction AAROM 1  x 30    PATIENT EDUCATION: Education details: Form and technique for correct performance of exercise and explanation of rotator cuff pathology.  Person educated: Patient Education method: Explanation, Demonstration, Verbal cues, and Handouts Education comprehension: verbalized understanding, returned demonstration, verbal cues required, and tactile cues required  HOME EXERCISE PROGRAM:  Access Code: [email or web address] URL: [email or web address] Date: 08/12/2023 Prepared by: Ellin Goodie  Exercises - Seated Shoulder Flexion AAROM with Pulley Behind  - 1 x daily - 7 x weekly - 30 reps - Seated Shoulder Abduction AAROM with Pulley Behind  - 1 x daily - 7 x weekly - 30 reps - Supine Thoracic Mobilization Towel Roll Vertical with Arm Stretch  - 3-4 x weekly - 3 reps - 60 sec  hold - Shoulder Abduction with Dumbbells - Thumbs Up  - 3-4 x weekly - 2 sets - 10 reps - 5 sec hold - Seated Thoracic Lumbar Extension with Pectoralis Stretch  - 1 x daily - 2 sets - 10 reps - 3-5 sec hold - Standing Shoulder Horizontal Abduction with Resistance  - 3-4 x weekly - 3 sets - 10 reps - Chest Press with Resistance  - 3-4 x weekly - 3 sets - 10 reps - Standing Low Trap Setting with Resistance at Wall  - 3-4 x weekly - 3 sets - 10 reps - Shoulder External Rotation and Scapular Retraction with Resistance  - 3-4 x weekly - 3 sets - 10 reps   ASSESSMENT:  CLINICAL IMPRESSION: Pt shows improvement with activity tolerance with decreased shoulder pain with periscapular exercises with increased  resistance. He does show significant upper trap activation when performing exercises to compensate for shoulder and periscapular weakness. PT to focus on neuromuscular facilitation of shoulder and periscapular movements to avoid upper trap compensation for next couple of sessions. He will continue to benefit from skilled PT to address the aforementioned deficits to improve overhead shoulder movements to lift gun for shooting and lift weights for recreational fitness.    OBJECTIVE IMPAIRMENTS: decreased ROM, decreased strength, impaired UE functional use, postural dysfunction, and pain.   ACTIVITY LIMITATIONS: carrying, lifting, toileting, dressing, and reach over head  PARTICIPATION LIMITATIONS: yard work and recreation: golf   PERSONAL FACTORS: Age, Time since onset of injury/illness/exacerbation, and 3+ comorbidities: HTN, Gout, and Hypothyroidism  are also affecting patient's functional outcome.   REHAB POTENTIAL: Good  CLINICAL DECISION MAKING: Stable/uncomplicated  EVALUATION COMPLEXITY: Low   GOALS: Goals reviewed with patient? No  SHORT TERM GOALS: Target date: 07/14/2023  Patient will demonstrate undestanding of home exercise plan by performing exercises correctly with evidence of good carry over with min to no verbal or tactile cues.  Baseline: NT 07/14/23: Performing independently  Goal status: ACHIEVED     LONG TERM GOALS: Target date: 09/08/2023  Patient improve QuickDash by >=8 pts as evidence of a statistically significant improvement in his shoulder function to better perform recreational tasks like holding gun and lifting weights.   Baseline: 28 pts Goal status: ONGOING   2.  Patient will improve bilateral shoulder and periscapular strength by 1/3 grade MMT (4- to 4) for improved shoulder function to better perform recreational tasks like holding gun and lifting weights.   Baseline: Shoulder Flex R/L 4/4, Shoulder Abd R/L 4/4, Shoulder ER R/L 4-/4-, Shoulder IR R/L  4/4, Lower Trap R/L 4-/4, Mid Trap R/L 4/4  Goal status: ONGOING   3.  Patient will be able to perform right shoulder flexion isometric hold for >=2 min to be able to hold and shoot gun. Baseline:  NT 08/10/23: Able to perform isometric hold to hold gun  Goal status: ACHIEVED     PLAN:  PT FREQUENCY: 1-2x/week  PT DURATION: 10 weeks  PLANNED INTERVENTIONS: 97164- PT Re-evaluation, 97110-Therapeutic exercises, 97530- Therapeutic activity, [id number]- Neuromuscular re-education, 97140- Manual therapy, [id number]- Aquatic Therapy, 97014- Electrical stimulation (unattended), [id number]- Electrical stimulation (manual), 65784- Traction (mechanical), Taping, Dry Needling, Joint mobilization, Joint manipulation, Spinal manipulation, Spinal mobilization, Cryotherapy, and Moist heat  PLAN FOR NEXT SESSION: Inquire about shooting and pain response. Reassess shoulder MMT. Focus on shoulder flexion and abduction without shrugging shoulders    Ellin Goodie PT, DPT  [facility] [address], [place], [address] Phone: [phone]   Fax:  [phone]

## 2023-08-16 ENCOUNTER — Ambulatory Visit: Payer: PPO | Admitting: Physical Therapy

## 2023-08-17 DIAGNOSIS — R0602 Shortness of breath: Secondary | ICD-10-CM | POA: Diagnosis not present

## 2023-08-17 DIAGNOSIS — R051 Acute cough: Secondary | ICD-10-CM | POA: Diagnosis not present

## 2023-08-17 DIAGNOSIS — R0989 Other specified symptoms and signs involving the circulatory and respiratory systems: Secondary | ICD-10-CM | POA: Diagnosis not present

## 2023-08-17 DIAGNOSIS — E538 Deficiency of other specified B group vitamins: Secondary | ICD-10-CM | POA: Diagnosis not present

## 2023-08-17 DIAGNOSIS — R6889 Other general symptoms and signs: Secondary | ICD-10-CM | POA: Diagnosis not present

## 2023-08-18 ENCOUNTER — Ambulatory Visit: Payer: PPO | Admitting: Physical Therapy

## 2023-08-23 ENCOUNTER — Ambulatory Visit: Payer: PPO | Admitting: Physical Therapy

## 2023-08-23 ENCOUNTER — Telehealth: Payer: Self-pay | Admitting: Physical Therapy

## 2023-08-23 NOTE — Telephone Encounter (Signed)
 Called pt to inquiry about absence from PT. Pt reports that he is getting over flu and pneumonia and he will not be able to come into PT for the rest of the week.

## 2023-08-25 ENCOUNTER — Ambulatory Visit: Payer: PPO | Admitting: Physical Therapy

## 2023-08-31 ENCOUNTER — Ambulatory Visit: Payer: PPO | Attending: Infectious Diseases | Admitting: Physical Therapy

## 2023-08-31 DIAGNOSIS — G8929 Other chronic pain: Secondary | ICD-10-CM | POA: Diagnosis not present

## 2023-08-31 DIAGNOSIS — M25512 Pain in left shoulder: Secondary | ICD-10-CM | POA: Insufficient documentation

## 2023-08-31 DIAGNOSIS — M25511 Pain in right shoulder: Secondary | ICD-10-CM | POA: Insufficient documentation

## 2023-08-31 NOTE — Therapy (Signed)
 OUTPATIENT PHYSICAL THERAPY SHOULDER TREATMENT    Patient Name: Jacob MEINHARDT Sr. MRN: [id number] DOB:08/23/1933, 89 y.o., male Today's Date: 08/31/2023  END OF SESSION:  PT End of Session - 08/31/23 0902     Visit Number 8    Number of Visits 20    Date for PT Re-Evaluation 09/08/23    Authorization Type [id number]    Authorization - Visit Number 8    Authorization - Number of Visits 20    Progress Note Due on Visit 10    PT Start Time 0903    PT Stop Time 0945    PT Time Calculation (min) 42 min    Activity Tolerance Patient tolerated treatment well    Behavior During Therapy WFL for tasks assessed/performed                 Past Medical History:  Diagnosis Date   Arthritis    right hand   BPH (benign prostatic hyperplasia)    CKD (chronic kidney disease), stage III (HCC)    COVID-19 05/2020   ED (erectile dysfunction)    Elevated PSA    GERD (gastroesophageal reflux disease)    occasional   Glaucoma    Gout    Gout    History of alcoholism (HCC)    History of chicken pox    History of measles    History of mumps    History of pernicious anemia    HTN (hypertension)    controlled   Hypothyroidism    Wears partial dentures    upper and lower    Past Surgical History:  Procedure Laterality Date   CATARACT EXTRACTION W/PHACO Right 10/08/2020   Procedure: CATARACT EXTRACTION PHACO AND INTRAOCULAR LENS PLACEMENT (IOC) RIGHT OMIDRIA;  Surgeon: Galen Manila, MD;  Location: [facility];  Service: Ophthalmology;  Laterality: Right;  CDE 27.87 02:08.6 minutes   CATARACT EXTRACTION W/PHACO Left 10/22/2020   Procedure: CATARACT EXTRACTION PHACO AND INTRAOCULAR LENS PLACEMENT (IOC) LEFT;  Surgeon: Galen Manila, MD;  Location: [facility];  Service: Ophthalmology;  Laterality: Left;  7.97 00:50.1   EYE SURGERY Right 10/24/2019   laser eye surgery on right eye for glaucoma   PARATHYROIDECTOMY  2004   PHOTOCOAGULATION WITH LASER Right 10/07/2015    Procedure: PHOTOCOAGULATION WITH LASER RIGHT EYE TRANS SCLERAL;  Surgeon: Sherald Hess, MD;  Location: [facility];  Service: Ophthalmology;  Laterality: Right;   TONSILLECTOMY     Patient Active Problem List   Diagnosis Date Noted   Hyperuricemia 03/08/2020   Senile nuclear sclerosis, bilateral 02/02/2020   Tinea corporis 07/22/2016   Elevated PSA 07/13/2015   Allergic rhinitis 03/08/2015   BPH (benign prostatic hyperplasia) 03/08/2015   Chronic kidney disease (CKD), stage III (moderate) (HCC) 03/08/2015   Essential (primary) hypertension 03/08/2015   Gouty arthritis 03/08/2015   H/O alcohol abuse 03/08/2015   Hyperlipidemia 03/08/2015   Hypothyroid 03/08/2015   Primary open angle glaucoma of both eyes, severe stage 03/08/2015   Osteopenia 03/08/2015   Pernicious anemia 03/08/2015   Pruritus 03/08/2015   Psoriasiform dermatitis 04/18/2013   Other long term (current) drug therapy 03/15/2013   Polypharmacy 03/15/2013   Dermatophytosis of groin 08/02/2012   Dermatitis, eczematoid 05/17/2012    PCP: Dr. Clydie Braun    REFERRING PROVIDER: Dr. Clydie Braun   REFERRING DIAG:  [id number] (ICD-10-CM) - Chronic right shoulder pain M25.512,G89.29 (ICD-10-CM) - Chronic left shoulder pain    THERAPY DIAG:  Chronic right shoulder pain  Chronic left shoulder pain  Rationale for Evaluation and Treatment: Rehabilitation  ONSET DATE: 08/2022   SUBJECTIVE:                                                                                                                                                                                      SUBJECTIVE STATEMENT: Pt reports that he is just getting over flu and pneumonia and he feels weak. He states that his shoulder pain is      Hand dominance: Right  PERTINENT HISTORY: Shoulders started hurting 9 months ago and came on with insidious onset.   Pt reports that he received cortisone shots in both his  shoulders and he is now feeling pain again. 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 Pt reports also night sweats that have been worsening, but he does not feel shoulder pain at night unless he lays on his sides.   PAIN:  Are you having pain? Yes: NPRS scale: 3/10 Pain location: On top of AC joint  Pain description: Achy Aggravating factors: Abducting, flexing and externally rotating overhead  Relieving factors: Keep shoulders down by his side and not laying on his shoulders  PRECAUTIONS: None  RED FLAGS: None   WEIGHT BEARING RESTRICTIONS: No  FALLS:  Has patient fallen in last 6 months? No  LIVING ENVIRONMENT: Lives with: lives with their spouse Lives in: House/apartment Stairs: Yes: Internal: 16 steps; on right going up and External: 2 steps; none Has following equipment at home: None  OCCUPATION: Retired   PLOF: Independent  PATIENT GOALS: Patient wants to return to lifting weight   NEXT MD VISIT: February 14th, 2025   OBJECTIVE:  Note: Objective measures were completed at Evaluation unless otherwise noted.  VITALS BP 141/68 HR 70 SpO2 100%  DIAGNOSTIC FINDINGS:  No imaging listed in charts   PATIENT SURVEYS:  Quick Dash including Sports Module= 28%   COGNITION: Overall cognitive status: Within functional limits for tasks assessed     SENSATION: WFL  POSTURE: Forward head and rounded shoulders   UPPER EXTREMITY ROM:                                                                   Cervical  AROM                                            Flex    45                                           Ext      45                        Lat Side Bend R/L 45/45                       Rotation       R/L     60/60  Active ROM Right eval Left eval  Shoulder flexion 160* 160*  Shoulder extension    Shoulder abduction 160* 160*  Shoulder adduction    Shoulder internal rotation 70 70  Shoulder  external rotation 90 90  Elbow flexion    Elbow extension    Wrist flexion    Wrist extension    Wrist ulnar deviation    Wrist radial deviation    Wrist pronation    Wrist supination    (Blank rows = not tested)                                               UPPER EXTREMITY MMT:  MMT Right eval Left eval  Shoulder flexion 4 4  Shoulder extension 4 4  Shoulder abduction 4 4  Shoulder adduction    Shoulder internal rotation 4 4  Shoulder external rotation 4-* 4-*  Middle trapezius 4 4  Lower trapezius 4- 4  Elbow flexion    Elbow extension    Wrist flexion    Wrist extension    Wrist ulnar deviation    Wrist radial deviation    Wrist pronation    Wrist supination    Grip strength (lbs)    (Blank rows = not tested)  SHOULDER SPECIAL TESTS: Impingement tests: Neer impingement test: NT, Hawkins/Kennedy impingement test: NT, and Painful arc test: NT  Rotator cuff assessment: Full can test: positive , Belly press test: negative, and Infraspinatus test: positive  Biceps assessment: Speed's test: negative  JOINT MOBILITY TESTING:  Not tested    PALPATION:  AC joint TTP                                                                                                                              TREATMENT DATE:  08/31/23: THEREX  UBE with seat at 10 resistance 2- 2.5 forward and 2.5 min  backward   OMEGA Seated Rows #45 3 x 10  OMEGA Lat Pull Downs #45 2 x 10  -Pt reports bilateral shoulder pain 3/10  Shoulder Abduction with #5 DB 2 x 10  -NRPS 1-2/10  Shoulder Abduction in Shooter Stance 2 x 10  Eccentric Shoulder Flexion with #5 DB 2 x 10    08/12/23: THEREX  UBE with seat at 10 with resistance 2- 2.5 forward and 2.5 min backward  OMEGA Seated Rows #45 3 x 10  OMEGA Lat Pulldowns #45 3 x 10  Shoulder Horizontal Abduction at 90 deg  with red band 3 x 10  -min VC to keep elbows extending and to keep from shrugging shoulders  Shoulder ER at 0 deg abduction  on LUE with yellow band 1 x 10  -Pt reports increase in pain on LUE  Shoulder ER at 0 deg abduction on LUE with #1 DB 1 x 10  Shoulder ER at 0 deg abduction on LUE with #2 DB 2 x 10   Self-Care  Explanation about  shoulder impingement and to avoid reaching forward to weed garden and to avoid external rotation with exercise like D2 PNF flexion and when performing overhead functional activities.    08/10/23: THEREX  UBE with seat at 10 with resistance 2- 2.5 forward and 2.5 min backward  OMEGA Seated Rows #45 3 x 10  OMEGA Lat Pulldowns #45 3 x 10  -min VC to increase lumbar extension to avoid shoulder impingement  Seated Thoracic Extension with arms behind head 2 x 10 with 5 sec hold  OMEGA Face Pulls #5 1 x 10  -mod VC  Face Pulls with yellow band   D2 Shoulder Flexion and Extension with yellow band 2 x 10  -Pt reports increased left shoulder pain  Lower Trap Setting 1 x 10  Lower Trap Setting with yellow band for resistance with abduction 2 x 10  Shoulder Horizontal Abduction with yellow band 1 x 10   08/04/23: THEREX  UBE with seat at 10 with resistance 2- 2.5 forward and 2.5 min backward  OMEGA Seated Rows #45 3 x 10  OMEGA Lat Pulldowns #45 3 x 10  Shoulder Flexion #3 DB 3 x 10  Shoulder Scaption #3 DB 3 x 10  -Able to reach 120 deg  Thoracic Extension with Chest Stretch 1 x 10 with 5 sec hold  -Pt reports increased shoulder pain  Thoracic Extension without arms across chest and not overhead 2 x 10 with 5 sec hold  -min VC to maintain neutral neck posture   08/02/22: THEREX  UBE with seat at 10 with resistance 2- 2.5 forward and 2.5 min backward  OMEGA Seated Rows #35 1 x 10  OMEGA Seated Rows #45 2 x 10  OMEGA Lat Pull Down #45 3 x 10  Biceps Load 1: Negative Bilaterally  Biceps Load 2: Positive on Left     PATIENT EDUCATION: Education details: Form and technique for correct performance of exercise and explanation of rotator cuff pathology.  Person educated:  Patient Education method: Explanation, Demonstration, Verbal cues, and Handouts Education comprehension: verbalized understanding, returned demonstration, verbal cues required, and tactile cues required  HOME EXERCISE PROGRAM:  Access Code: [email or web address] URL: [email or web address] Date: 08/12/2023 Prepared by: Ellin Goodie  Exercises - Seated Shoulder Flexion AAROM with Pulley Behind  - 1 x daily - 7 x weekly - 30 reps - Seated Shoulder Abduction AAROM with Pulley Behind  - 1 x daily - 7 x weekly -  30 reps - Supine Thoracic Mobilization Towel Roll Vertical with Arm Stretch  - 3-4 x weekly - 3 reps - 60 sec  hold - Shoulder Abduction with Dumbbells - Thumbs Up  - 3-4 x weekly - 2 sets - 10 reps - 5 sec hold - Seated Thoracic Lumbar Extension with Pectoralis Stretch  - 1 x daily - 2 sets - 10 reps - 3-5 sec hold - Standing Shoulder Horizontal Abduction with Resistance  - 3-4 x weekly - 3 sets - 10 reps - Chest Press with Resistance  - 3-4 x weekly - 3 sets - 10 reps - Standing Low Trap Setting with Resistance at Wall  - 3-4 x weekly - 3 sets - 10 reps - Shoulder External Rotation and Scapular Retraction with Resistance  - 3-4 x weekly - 3 sets - 10 reps   ASSESSMENT:  CLINICAL IMPRESSION: Pt returning after a week because of sickness. Session limited to 30 minutes due pt's fatigue from recovering from pneumonia and flu. He was able to show ongoing maintenance of shoulder strength and activity tolerance with ability to perform strengthening exercises without significantly increasing his pain. He will continue to benefit from skilled PT to address the aforementioned deficits to improve overhead shoulder movements to lift gun for shooting and lift weights for recreational fitness.   OBJECTIVE IMPAIRMENTS: decreased ROM, decreased strength, impaired UE functional use, postural dysfunction, and pain.   ACTIVITY LIMITATIONS: carrying, lifting, toileting, dressing, and reach over  head  PARTICIPATION LIMITATIONS: yard work and recreation: golf   PERSONAL FACTORS: Age, Time since onset of injury/illness/exacerbation, and 3+ comorbidities: HTN, Gout, and Hypothyroidism  are also affecting patient's functional outcome.   REHAB POTENTIAL: Good  CLINICAL DECISION MAKING: Stable/uncomplicated  EVALUATION COMPLEXITY: Low   GOALS: Goals reviewed with patient? No  SHORT TERM GOALS: Target date: 07/14/2023  Patient will demonstrate undestanding of home exercise plan by performing exercises correctly with evidence of good carry over with min to no verbal or tactile cues.  Baseline: NT 07/14/23: Performing independently  Goal status: ACHIEVED     LONG TERM GOALS: Target date: 09/08/2023  Patient improve QuickDash by >=8 pts as evidence of a statistically significant improvement in his shoulder function to better perform recreational tasks like holding gun and lifting weights.   Baseline: 28 pts Goal status: ONGOING   2.  Patient will improve bilateral shoulder and periscapular strength by 1/3 grade MMT (4- to 4) for improved shoulder function to better perform recreational tasks like holding gun and lifting weights.   Baseline: Shoulder Flex R/L 4/4, Shoulder Abd R/L 4/4, Shoulder ER R/L 4-/4-, Shoulder IR R/L 4/4, Lower Trap R/L 4-/4, Mid Trap R/L 4/4                                                                                                             Goal status: ONGOING   3.  Patient will be able to perform right shoulder flexion isometric hold for >=2 min to be able to hold and shoot gun. Baseline:  NT 08/10/23: Able to perform isometric hold to hold gun  Goal status: ACHIEVED     PLAN:  PT FREQUENCY: 1-2x/week  PT DURATION: 10 weeks  PLANNED INTERVENTIONS: 97164- PT Re-evaluation, 97110-Therapeutic exercises, 97530- Therapeutic activity, [id number]- Neuromuscular re-education, 97140- Manual therapy, [id number]- Aquatic Therapy, 97014- Electrical stimulation  (unattended), [id number]- Electrical stimulation (manual), 16109- Traction (mechanical), Taping, Dry Needling, Joint mobilization, Joint manipulation, Spinal manipulation, Spinal mobilization, Cryotherapy, and Moist heat  PLAN FOR NEXT SESSION: Check on apts. Reassess shoulder MMT. Focus on shoulder flexion and abduction without shrugging shoulders. Progress periscapular strength.    Ellin Goodie PT, DPT  [facility] [address], [place], [address] Phone: [phone]   Fax:  [phone]

## 2023-09-02 ENCOUNTER — Ambulatory Visit: Payer: PPO | Admitting: Physical Therapy

## 2023-09-02 DIAGNOSIS — M25511 Pain in right shoulder: Secondary | ICD-10-CM | POA: Diagnosis not present

## 2023-09-02 DIAGNOSIS — G8929 Other chronic pain: Secondary | ICD-10-CM

## 2023-09-02 NOTE — Therapy (Signed)
 OUTPATIENT PHYSICAL THERAPY SHOULDER TREATMENT    Patient Name: Jacob KLEVEN Sr. MRN: [id number] DOB:[birth year], 89 y.o., male Today's Date: 09/02/2023  END OF SESSION:  PT End of Session - 09/02/23 0907     Visit Number 9    Number of Visits 20    Date for PT Re-Evaluation 09/08/23    Authorization Type [id number]    Authorization - Visit Number 9    Authorization - Number of Visits 20    Progress Note Due on Visit 10    PT Start Time 0905    PT Stop Time 0945    PT Time Calculation (min) 40 min    Activity Tolerance Patient tolerated treatment well    Behavior During Therapy WFL for tasks assessed/performed                 Past Medical History:  Diagnosis Date   Arthritis    right hand   BPH (benign prostatic hyperplasia)    CKD (chronic kidney disease), stage III (HCC)    COVID-19 05/2020   ED (erectile dysfunction)    Elevated PSA    GERD (gastroesophageal reflux disease)    occasional   Glaucoma    Gout    Gout    History of alcoholism (HCC)    History of chicken pox    History of measles    History of mumps    History of pernicious anemia    HTN (hypertension)    controlled   Hypothyroidism    Wears partial dentures    upper and lower    Past Surgical History:  Procedure Laterality Date   CATARACT EXTRACTION W/PHACO Right 10/08/2020   Procedure: CATARACT EXTRACTION PHACO AND INTRAOCULAR LENS PLACEMENT (IOC) RIGHT OMIDRIA;  Surgeon: Galen Manila, MD;  Location: [facility];  Service: Ophthalmology;  Laterality: Right;  CDE 27.87 02:08.6 minutes   CATARACT EXTRACTION W/PHACO Left 10/22/2020   Procedure: CATARACT EXTRACTION PHACO AND INTRAOCULAR LENS PLACEMENT (IOC) LEFT;  Surgeon: Galen Manila, MD;  Location: [facility];  Service: Ophthalmology;  Laterality: Left;  7.97 00:50.1   EYE SURGERY Right 10/24/2019   laser eye surgery on right eye for glaucoma   PARATHYROIDECTOMY  2004   PHOTOCOAGULATION WITH LASER Right 10/07/2015    Procedure: PHOTOCOAGULATION WITH LASER RIGHT EYE TRANS SCLERAL;  Surgeon: Sherald Hess, MD;  Location: [facility];  Service: Ophthalmology;  Laterality: Right;   TONSILLECTOMY     Patient Active Problem List   Diagnosis Date Noted   Hyperuricemia 03/08/2020   Senile nuclear sclerosis, bilateral 02/02/2020   Tinea corporis 07/22/2016   Elevated PSA 07/13/2015   Allergic rhinitis 03/08/2015   BPH (benign prostatic hyperplasia) 03/08/2015   Chronic kidney disease (CKD), stage III (moderate) (HCC) 03/08/2015   Essential (primary) hypertension 03/08/2015   Gouty arthritis 03/08/2015   H/O alcohol abuse 03/08/2015   Hyperlipidemia 03/08/2015   Hypothyroid 03/08/2015   Primary open angle glaucoma of both eyes, severe stage 03/08/2015   Osteopenia 03/08/2015   Pernicious anemia 03/08/2015   Pruritus 03/08/2015   Psoriasiform dermatitis 04/18/2013   Other long term (current) drug therapy 03/15/2013   Polypharmacy 03/15/2013   Dermatophytosis of groin 08/02/2012   Dermatitis, eczematoid 05/17/2012    PCP: Dr. Clydie Braun    REFERRING PROVIDER: Dr. Clydie Braun   REFERRING DIAG:  [phone] (ICD-10-CM) - Chronic right shoulder pain M25.512,G89.29 (ICD-10-CM) - Chronic left shoulder pain    THERAPY DIAG:  Chronic right shoulder pain  Chronic left shoulder pain  Rationale for Evaluation and Treatment: Rehabilitation  ONSET DATE: 08/2022   SUBJECTIVE:                                                                                                                                                                                      SUBJECTIVE STATEMENT: Pt reports INCR  Hand dominance: Right  PERTINENT HISTORY: Shoulders started hurting 9 months ago and came on with insidious onset.   Pt reports that he received cortisone shots in both his shoulders and he is now feeling pain again. 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 Pt reports also night sweats that have been worsening, but he does not feel shoulder pain at night unless he lays on his sides.   PAIN:  Are you having pain? Yes: NPRS scale: 3/10 Pain location: On top of AC joint  Pain description: Achy Aggravating factors: Abducting, flexing and externally rotating overhead  Relieving factors: Keep shoulders down by his side and not laying on his shoulders  PRECAUTIONS: None  RED FLAGS: None   WEIGHT BEARING RESTRICTIONS: No  FALLS:  Has patient fallen in last 6 months? No  LIVING ENVIRONMENT: Lives with: lives with their spouse Lives in: House/apartment Stairs: Yes: Internal: 16 steps; on right going up and External: 2 steps; none Has following equipment at home: None  OCCUPATION: Retired   PLOF: Independent  PATIENT GOALS: Patient wants to return to lifting weight   NEXT MD VISIT: February 14th, 2025   OBJECTIVE:  Note: Objective measures were completed at Evaluation unless otherwise noted.  VITALS BP 141/68 HR 70 SpO2 100%  DIAGNOSTIC FINDINGS:  No imaging listed in charts   PATIENT SURVEYS:  Quick Dash including Sports Module= 28%   COGNITION: Overall cognitive status: Within functional limits for tasks assessed     SENSATION: WFL  POSTURE: Forward head and rounded shoulders   UPPER EXTREMITY ROM:                                                                   Cervical                                AROM  Flex    45                                           Ext      45                        Lat Side Bend R/L 45/45                       Rotation       R/L     60/60  Active ROM Right eval Left eval  Shoulder flexion 160* 160*  Shoulder extension    Shoulder abduction 160* 160*  Shoulder adduction    Shoulder internal rotation 70 70  Shoulder external rotation 90 90  Elbow flexion    Elbow extension    Wrist flexion    Wrist extension    Wrist  ulnar deviation    Wrist radial deviation    Wrist pronation    Wrist supination    (Blank rows = not tested)                                               UPPER EXTREMITY MMT:  MMT Right eval Left eval Right  09/02/23 Left  09/02/23  Shoulder flexion 4 4 4+ 4+  Shoulder extension 4 4 4  4+  Shoulder abduction 4 4 4+ 4+  Shoulder adduction      Shoulder internal rotation 4 4 4+ 4+  Shoulder external rotation 4-* 4-* 4+ 4+  Middle trapezius 4 4 4- 4  Lower trapezius 4- 4 4- 4  Elbow flexion      Elbow extension      Wrist flexion      Wrist extension      Wrist ulnar deviation      Wrist radial deviation      Wrist pronation      Wrist supination      Grip strength (lbs)      (Blank rows = not tested)  SHOULDER SPECIAL TESTS: Impingement tests: Neer impingement test: NT, Hawkins/Kennedy impingement test: NT, and Painful arc test: NT  Rotator cuff assessment: Full can test: positive , Belly press test: negative, and Infraspinatus test: positive  Biceps assessment: Speed's test: negative  JOINT MOBILITY TESTING:  Not tested    PALPATION:  AC joint TTP                                                                                                                              TREATMENT DATE:  09/02/23: THEREX  UBE seat at 8 with resistance at 3 for 5 min  OMEGA Seated Rows #45  3 x 10  -Pt reports increased right sided neck pain  Bent Over Y's #2 DB 3 x 10  -min VC to sequence exercise and to keep thumbs up  Bent Over T's #2 DB 3 x 10    PHYSICAL PERFORMANCE   MMT Right 09/02/23 Left 09/02/23  Shoulder flexion 4+ 4+  Shoulder extension 4+ 4+  Shoulder abduction 4 4+  Shoulder adduction    Shoulder internal rotation 4+ 4+  Shoulder external rotation 4+ 4+  Middle trapezius 4- 4  Lower trapezius 4- 4  Elbow flexion    Elbow extension    Wrist flexion    Wrist extension    Wrist ulnar deviation    Wrist radial deviation    Wrist pronation    Wrist  supination    Grip strength (lbs)    (Blank rows = not tested)   08/31/23: THEREX  UBE with seat at 10 resistance 2- 2.5 forward and 2.5 min backward   OMEGA Seated Rows #45 3 x 10  OMEGA Lat Pull Downs #45 2 x 10  -Pt reports bilateral shoulder pain 3/10  Shoulder Abduction with #5 DB 2 x 10  -NRPS 1-2/10  Shoulder Abduction in Shooter Stance 2 x 10  Eccentric Shoulder Flexion with #5 DB 2 x 10    08/12/23: THEREX  UBE with seat at 10 with resistance 2- 2.5 forward and 2.5 min backward  OMEGA Seated Rows #45 3 x 10  OMEGA Lat Pulldowns #45 3 x 10  Shoulder Horizontal Abduction at 90 deg  with red band 3 x 10  -min VC to keep elbows extending and to keep from shrugging shoulders  Shoulder ER at 0 deg abduction on LUE with yellow band 1 x 10  -Pt reports increase in pain on LUE  Shoulder ER at 0 deg abduction on LUE with #1 DB 1 x 10  Shoulder ER at 0 deg abduction on LUE with #2 DB 2 x 10   Self-Care  Explanation about  shoulder impingement and to avoid reaching forward to weed garden and to avoid external rotation with exercise like D2 PNF flexion and when performing overhead functional activities.    08/10/23: THEREX  UBE with seat at 10 with resistance 2- 2.5 forward and 2.5 min backward  OMEGA Seated Rows #45 3 x 10  OMEGA Lat Pulldowns #45 3 x 10  -min VC to increase lumbar extension to avoid shoulder impingement  Seated Thoracic Extension with arms behind head 2 x 10 with 5 sec hold  OMEGA Face Pulls #5 1 x 10  -mod VC  Face Pulls with yellow band   D2 Shoulder Flexion and Extension with yellow band 2 x 10  -Pt reports increased left shoulder pain  Lower Trap Setting 1 x 10  Lower Trap Setting with yellow band for resistance with abduction 2 x 10  Shoulder Horizontal Abduction with yellow band 1 x 10   08/04/23: THEREX  UBE with seat at 10 with resistance 2- 2.5 forward and 2.5 min backward  OMEGA Seated Rows #45 3 x 10  OMEGA Lat Pulldowns #45 3 x 10  Shoulder  Flexion #3 DB 3 x 10  Shoulder Scaption #3 DB 3 x 10  -Able to reach 120 deg  Thoracic Extension with Chest Stretch 1 x 10 with 5 sec hold  -Pt reports increased shoulder pain  Thoracic Extension without arms across chest and not overhead 2 x 10 with 5 sec hold  -min VC  to maintain neutral neck posture     PATIENT EDUCATION: Education details: Form and technique for correct performance of exercise and explanation of rotator cuff pathology.  Person educated: Patient Education method: Explanation, Demonstration, Verbal cues, and Handouts Education comprehension: verbalized understanding, returned demonstration, verbal cues required, and tactile cues required  HOME EXERCISE PROGRAM:  Access Code: [email or web address] URL: [email or web address] Date: 09/02/2023 Prepared by: Ellin Goodie  Exercises - Seated Shoulder Flexion AAROM with Pulley Behind  - 1 x daily - 7 x weekly - 30 reps - Seated Shoulder Abduction AAROM with Pulley Behind  - 1 x daily - 7 x weekly - 30 reps - Supine Thoracic Mobilization Towel Roll Vertical with Arm Stretch  - 3-4 x weekly - 3 reps - 60 sec  hold - Shoulder Bent over Y's   - 3-4 x weekly - 3 sets - 10 reps - Single Arm Bent Over Shoulder Horizontal Abduction with Dumbbell - Palm Down  - 3-4 x weekly - 3 sets - 10 reps - Standing Shoulder Flexion to 180 degrees with dumbbells   - 3-4 x weekly - 3 sets - 10 reps - Shoulder Abduction with Dumbbells - Thumbs Up  - 3-4 x weekly - 2 sets - 10 reps - 5 sec hold - Seated Thoracic Lumbar Extension with Pectoralis Stretch  - 1 x daily - 2 sets - 10 reps - 3-5 sec hold - Shoulder External Rotation and Scapular Retraction with Resistance  - 3-4 x weekly - 3 sets - 10 reps   ASSESSMENT:  CLINICAL IMPRESSION: Pt demonstrates improvement in bilateral shoulder strength and improved left shoulder periscapular strength. He continues to experience pain exacerbation when reaching over head and he does have right  periscapular weakness. He is nearing the end of his plan of care and he will benefit from ongoing skilled PT to address the aforementioned deficits to improve overhead shoulder movements to lift gun for shooting and lift weights for recreational fitness.   OBJECTIVE IMPAIRMENTS: decreased ROM, decreased strength, impaired UE functional use, postural dysfunction, and pain.   ACTIVITY LIMITATIONS: carrying, lifting, toileting, dressing, and reach over head  PARTICIPATION LIMITATIONS: yard work and recreation: golf   PERSONAL FACTORS: Age, Time since onset of injury/illness/exacerbation, and 3+ comorbidities: HTN, Gout, and Hypothyroidism  are also affecting patient's functional outcome.   REHAB POTENTIAL: Good  CLINICAL DECISION MAKING: Stable/uncomplicated  EVALUATION COMPLEXITY: Low   GOALS: Goals reviewed with patient? No  SHORT TERM GOALS: Target date: 07/14/2023  Patient will demonstrate undestanding of home exercise plan by performing exercises correctly with evidence of good carry over with min to no verbal or tactile cues.  Baseline: NT 07/14/23: Performing independently  Goal status: ACHIEVED     LONG TERM GOALS: Target date: 09/08/2023  Patient improve QuickDash by >=8 pts as evidence of a statistically significant improvement in his shoulder function to better perform recreational tasks like holding gun and lifting weights.   Baseline: 28 pts Goal status: ONGOING   2.  Patient will improve bilateral shoulder and periscapular strength by 1/3 grade MMT (4- to 4) for improved shoulder function to better perform recreational tasks like holding gun and lifting weights.   Baseline: Shoulder Flex R/L 4/4, Shoulder Abd R/L 4/4, Shoulder ER R/L 4-/4-, Shoulder IR R/L 4/4, Lower Trap R/L 4-/4, Mid Trap R/L 4/4   MMT Right 09/02/23 Left 09/02/23  Shoulder flexion 4+ 4+  Shoulder extension 4+ 4+  Shoulder abduction 4 4+  Shoulder adduction    Shoulder  internal rotation 4+ 4+   Shoulder external rotation 4+ 4+  Middle trapezius 4- 4  Lower trapezius 4- 4                                                                                                      Goal status: PARTIALLY MET    3.  Patient will be able to perform right shoulder flexion isometric hold for >=2 min to be able to hold and shoot gun. Baseline:  NT 08/10/23: Able to perform isometric hold to hold gun  Goal status: ACHIEVED     PLAN:  PT FREQUENCY: 1-2x/week  PT DURATION: 10 weeks  PLANNED INTERVENTIONS: 97164- PT Re-evaluation, 97110-Therapeutic exercises, 97530- Therapeutic activity, [id number]- Neuromuscular re-education, 97140- Manual therapy, [id number]- Aquatic Therapy, 97014- Electrical stimulation (unattended), [id number]- Electrical stimulation (manual), 96045- Traction (mechanical), Taping, Dry Needling, Joint mobilization, Joint manipulation, Spinal manipulation, Spinal mobilization, Cryotherapy, and Moist heat  PLAN FOR NEXT SESSION:  Progress note-take QuickDash survey. Upper trap stretch and focus on shoulder flexion and abduction without shoulder shrug.    Ellin Goodie PT, DPT  [facility] [address] [place], [address] Phone: [phone]   Fax:  [phone]

## 2023-09-06 ENCOUNTER — Ambulatory Visit: Payer: PPO | Admitting: Physical Therapy

## 2023-09-06 DIAGNOSIS — G8929 Other chronic pain: Secondary | ICD-10-CM

## 2023-09-06 DIAGNOSIS — M25511 Pain in right shoulder: Secondary | ICD-10-CM | POA: Diagnosis not present

## 2023-09-06 NOTE — Therapy (Signed)
 OUTPATIENT PHYSICAL THERAPY SHOULDER PROGRESS AND DISCHARGE    Dates of Reporting: 06/30/23-09/06/23  Patient Name: Jacob NIEHOFF Sr. MRN: [id number] DOB:November 01, 1933, 89 y.o., male Today's Date: 09/06/2023  END OF SESSION:  PT End of Session - 09/06/23 0903     Visit Number 10    Number of Visits 20    Date for PT Re-Evaluation 09/08/23    Authorization Type HTA [id number]    Authorization - Visit Number 10    Authorization - Number of Visits 20    Progress Note Due on Visit 10    PT Start Time 0900    PT Stop Time 0945    PT Time Calculation (min) 45 min    Activity Tolerance Patient tolerated treatment well    Behavior During Therapy WFL for tasks assessed/performed                 Past Medical History:  Diagnosis Date   Arthritis    right hand   BPH (benign prostatic hyperplasia)    CKD (chronic kidney disease), stage III (HCC)    COVID-19 05/2020   ED (erectile dysfunction)    Elevated PSA    GERD (gastroesophageal reflux disease)    occasional   Glaucoma    Gout    Gout    History of alcoholism (HCC)    History of chicken pox    History of measles    History of mumps    History of pernicious anemia    HTN (hypertension)    controlled   Hypothyroidism    Wears partial dentures    upper and lower    Past Surgical History:  Procedure Laterality Date   CATARACT EXTRACTION W/PHACO Right 10/08/2020   Procedure: CATARACT EXTRACTION PHACO AND INTRAOCULAR LENS PLACEMENT (IOC) RIGHT OMIDRIA;  Surgeon: Galen Manila, MD;  Location: [facility];  Service: Ophthalmology;  Laterality: Right;  CDE 27.87 02:08.6 minutes   CATARACT EXTRACTION W/PHACO Left 10/22/2020   Procedure: CATARACT EXTRACTION PHACO AND INTRAOCULAR LENS PLACEMENT (IOC) LEFT;  Surgeon: Galen Manila, MD;  Location: [facility];  Service: Ophthalmology;  Laterality: Left;  7.97 00:50.1   EYE SURGERY Right 10/24/2019   laser eye surgery on right eye for glaucoma   PARATHYROIDECTOMY   2004   PHOTOCOAGULATION WITH LASER Right 10/07/2015   Procedure: PHOTOCOAGULATION WITH LASER RIGHT EYE TRANS SCLERAL;  Surgeon: Sherald Hess, MD;  Location: [facility];  Service: Ophthalmology;  Laterality: Right;   TONSILLECTOMY     Patient Active Problem List   Diagnosis Date Noted   Hyperuricemia 03/08/2020   Senile nuclear sclerosis, bilateral 02/02/2020   Tinea corporis 07/22/2016   Elevated PSA 07/13/2015   Allergic rhinitis 03/08/2015   BPH (benign prostatic hyperplasia) 03/08/2015   Chronic kidney disease (CKD), stage III (moderate) (HCC) 03/08/2015   Essential (primary) hypertension 03/08/2015   Gouty arthritis 03/08/2015   H/O alcohol abuse 03/08/2015   Hyperlipidemia 03/08/2015   Hypothyroid 03/08/2015   Primary open angle glaucoma of both eyes, severe stage 03/08/2015   Osteopenia 03/08/2015   Pernicious anemia 03/08/2015   Pruritus 03/08/2015   Psoriasiform dermatitis 04/18/2013   Other long term (current) drug therapy 03/15/2013   Polypharmacy 03/15/2013   Dermatophytosis of groin 08/02/2012   Dermatitis, eczematoid 05/17/2012    PCP: Dr. Clydie Braun    REFERRING PROVIDER: Dr. Clydie Braun   REFERRING DIAG:  [phone] (ICD-10-CM) - Chronic right shoulder pain M25.512,G89.29 (ICD-10-CM) - Chronic left shoulder pain  THERAPY DIAG:  Chronic right shoulder pain  Chronic left shoulder pain  Rationale for Evaluation and Treatment: Rehabilitation  ONSET DATE: 08/2022   SUBJECTIVE:                                                                                                                                                                                      SUBJECTIVE STATEMENT: Pt reports that he had a slight increase in his shoulder pain from bench pressing and shooting multiple rounds targets. He does note a huge improvement in his shoulder function.   Hand dominance: Right  PERTINENT HISTORY: Shoulders started  hurting 9 months ago and came on with insidious onset.   Pt reports that he received cortisone shots in both his shoulders and he is now feeling pain again. 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 Pt reports also night sweats that have been worsening, but he does not feel shoulder pain at night unless he lays on his sides.   PAIN:  Are you having pain? Yes: NPRS scale: 3/10 Pain location: On top of AC joint  Pain description: Achy Aggravating factors: Abducting, flexing and externally rotating overhead  Relieving factors: Keep shoulders down by his side and not laying on his shoulders  PRECAUTIONS: None  RED FLAGS: None   WEIGHT BEARING RESTRICTIONS: No  FALLS:  Has patient fallen in last 6 months? No  LIVING ENVIRONMENT: Lives with: lives with their spouse Lives in: House/apartment Stairs: Yes: Internal: 16 steps; on right going up and External: 2 steps; none Has following equipment at home: None  OCCUPATION: Retired   PLOF: Independent  PATIENT GOALS: Patient wants to return to lifting weight   NEXT MD VISIT: February 14th, 2025   OBJECTIVE:  Note: Objective measures were completed at Evaluation unless otherwise noted.  VITALS BP 141/68 HR 70 SpO2 100%  DIAGNOSTIC FINDINGS:  No imaging listed in charts   PATIENT SURVEYS:  Quick Dash including Sports Module= 28%   COGNITION: Overall cognitive status: Within functional limits for tasks assessed     SENSATION: WFL  POSTURE: Forward head and rounded shoulders   UPPER EXTREMITY ROM:                                                                   Cervical  AROM                                            Flex    45                                           Ext      45                        Lat Side Bend R/L 45/45                       Rotation       R/L     60/60  Active ROM Right eval Left eval  Shoulder flexion 160* 160*  Shoulder  extension    Shoulder abduction 160* 160*  Shoulder adduction    Shoulder internal rotation 70 70  Shoulder external rotation 90 90  Elbow flexion    Elbow extension    Wrist flexion    Wrist extension    Wrist ulnar deviation    Wrist radial deviation    Wrist pronation    Wrist supination    (Blank rows = not tested)                                               UPPER EXTREMITY MMT:  MMT Right eval Left eval Right  09/02/23 Left  09/02/23  Shoulder flexion 4 4 4+ 4+  Shoulder extension 4 4 4  4+  Shoulder abduction 4 4 4+ 4+  Shoulder adduction      Shoulder internal rotation 4 4 4+ 4+  Shoulder external rotation 4-* 4-* 4+ 4+  Middle trapezius 4 4 4- 4  Lower trapezius 4- 4 4- 4  Elbow flexion      Elbow extension      Wrist flexion      Wrist extension      Wrist ulnar deviation      Wrist radial deviation      Wrist pronation      Wrist supination      Grip strength (lbs)      (Blank rows = not tested)  SHOULDER SPECIAL TESTS: Impingement tests: Neer impingement test: NT, Hawkins/Kennedy impingement test: NT, and Painful arc test: NT  Rotator cuff assessment: Full can test: positive , Belly press test: negative, and Infraspinatus test: positive  Biceps assessment: Speed's test: negative  JOINT MOBILITY TESTING:  Not tested    PALPATION:  AC joint TTP  TREATMENT DATE:  09/06/23:  Self-Care Home Management  Review of home exercise plan with modification for progressions: Education about how to progress using completion of 10 reps as way to know whether to increase weight  Education on staying with appropriate pain parameters with need to stop exercise if going over >3 NRPS pts from baseline   Physical Performance   QuickDash: 11.4%     09/02/23: THEREX  UBE seat at 8 with resistance at 3 for 5 min  OMEGA Seated Rows #45 3  x 10  -Pt reports increased right sided neck pain  Bent Over Y's #2 DB 3 x 10  -min VC to sequence exercise and to keep thumbs up  Bent Over T's #2 DB 3 x 10    PHYSICAL PERFORMANCE   MMT Right 09/02/23 Left 09/02/23  Shoulder flexion 4+ 4+  Shoulder extension 4+ 4+  Shoulder abduction 4 4+  Shoulder adduction    Shoulder internal rotation 4+ 4+  Shoulder external rotation 4+ 4+  Middle trapezius 4- 4  Lower trapezius 4- 4  Elbow flexion    Elbow extension    Wrist flexion    Wrist extension    Wrist ulnar deviation    Wrist radial deviation    Wrist pronation    Wrist supination    Grip strength (lbs)    (Blank rows = not tested)   08/31/23: THEREX  UBE with seat at 10 resistance 2- 2.5 forward and 2.5 min backward   OMEGA Seated Rows #45 3 x 10  OMEGA Lat Pull Downs #45 2 x 10  -Pt reports bilateral shoulder pain 3/10  Shoulder Abduction with #5 DB 2 x 10  -NRPS 1-2/10  Shoulder Abduction in Shooter Stance 2 x 10  Eccentric Shoulder Flexion with #5 DB 2 x 10    08/12/23: THEREX  UBE with seat at 10 with resistance 2- 2.5 forward and 2.5 min backward  OMEGA Seated Rows #45 3 x 10  OMEGA Lat Pulldowns #45 3 x 10  Shoulder Horizontal Abduction at 90 deg  with red band 3 x 10  -min VC to keep elbows extending and to keep from shrugging shoulders  Shoulder ER at 0 deg abduction on LUE with yellow band 1 x 10  -Pt reports increase in pain on LUE  Shoulder ER at 0 deg abduction on LUE with #1 DB 1 x 10  Shoulder ER at 0 deg abduction on LUE with #2 DB 2 x 10   Self-Care  Explanation about  shoulder impingement and to avoid reaching forward to weed garden and to avoid external rotation with exercise like D2 PNF flexion and when performing overhead functional activities.    08/10/23: THEREX  UBE with seat at 10 with resistance 2- 2.5 forward and 2.5 min backward  OMEGA Seated Rows #45 3 x 10  OMEGA Lat Pulldowns #45 3 x 10  -min VC to increase lumbar extension to  avoid shoulder impingement  Seated Thoracic Extension with arms behind head 2 x 10 with 5 sec hold  OMEGA Face Pulls #5 1 x 10  -mod VC  Face Pulls with yellow band   D2 Shoulder Flexion and Extension with yellow band 2 x 10  -Pt reports increased left shoulder pain  Lower Trap Setting 1 x 10  Lower Trap Setting with yellow band for resistance with abduction 2 x 10  Shoulder Horizontal Abduction with yellow band 1 x 10    PATIENT EDUCATION: Education details: Form  and technique for correct performance of exercise and explanation of rotator cuff pathology.  Person educated: Patient Education method: Explanation, Demonstration, Verbal cues, and Handouts Education comprehension: verbalized understanding, returned demonstration, verbal cues required, and tactile cues required  HOME EXERCISE PROGRAM:  Access Code: [email or web address] URL: [email or web address] Date: 09/06/2023 Prepared by: Ellin Goodie  Exercises - Seated Shoulder Abduction AAROM with Pulley Behind  - 1 x daily - 7 x weekly - 30 reps - Single Arm Bent Over Shoulder Horizontal Abduction with Dumbbell - Palm Down  - 3-4 x weekly - 3 sets - 10 reps - Prone Single Arm Shoulder Y with Dumbbell  - 3-4 x weekly - 3 sets - 10 reps - Standing Shoulder Flexion to 180 degrees with dumbbells   - 3-4 x weekly - 3 sets - 10 reps - Shoulder Abduction with Dumbbells - Thumbs Up  - 3-4 x weekly - 2 sets - 10 reps - 5 sec hold   ASSESSMENT:  CLINICAL IMPRESSION: Pt has now nearly met all his goals with the exception of reaching periscapular strength goal and he is no ready for discharge. He demonstrates a significant improvement in right shoulder function with an increase in [place]. He has been given a home exercise plan and education on how to progress exercises to maintain and improve periscapular and shoulder strength.    OBJECTIVE IMPAIRMENTS: decreased ROM, decreased strength, impaired UE functional use, postural  dysfunction, and pain.   ACTIVITY LIMITATIONS: carrying, lifting, toileting, dressing, and reach over head  PARTICIPATION LIMITATIONS: yard work and recreation: golf   PERSONAL FACTORS: Age, Time since onset of injury/illness/exacerbation, and 3+ comorbidities: HTN, Gout, and Hypothyroidism  are also affecting patient's functional outcome.   REHAB POTENTIAL: Good  CLINICAL DECISION MAKING: Stable/uncomplicated  EVALUATION COMPLEXITY: Low   GOALS: Goals reviewed with patient? No  SHORT TERM GOALS: Target date: 07/14/2023  Patient will demonstrate undestanding of home exercise plan by performing exercises correctly with evidence of good carry over with min to no verbal or tactile cues.  Baseline: NT 07/14/23: Performing independently  Goal status: ACHIEVED     LONG TERM GOALS: Target date: 09/08/2023  Patient improve QuickDash by >=8 pts as evidence of a statistically significant improvement in his shoulder function to better perform recreational tasks like holding gun and lifting weights.   Baseline: 28 pts 09/06/23: 11% Goal status: ACHIEVED   2.  Patient will improve bilateral shoulder and periscapular strength by 1/3 grade MMT (4- to 4) for improved shoulder function to better perform recreational tasks like holding gun and lifting weights.   Baseline: Shoulder Flex R/L 4/4, Shoulder Abd R/L 4/4, Shoulder ER R/L 4-/4-, Shoulder IR R/L 4/4, Lower Trap R/L 4-/4, Mid Trap R/L 4/4   MMT Right 09/02/23 Left 09/02/23  Shoulder flexion 4+ 4+  Shoulder extension 4+ 4+  Shoulder abduction 4 4+  Shoulder adduction    Shoulder internal rotation 4+ 4+  Shoulder external rotation 4+ 4+  Middle trapezius 4- 4  Lower trapezius 4- 4  Goal status: PARTIALLY MET    3.  Patient will be able to perform right shoulder flexion isometric hold for >=2 min to be able to hold and shoot gun. Baseline:   NT 08/10/23: Able to perform isometric hold to hold gun  Goal status: ACHIEVED     PLAN:  PT FREQUENCY: 1-2x/week  PT DURATION: 10 weeks  PLANNED INTERVENTIONS: 97164- PT Re-evaluation, 97110-Therapeutic exercises, 97530- Therapeutic activity, [id number]- Neuromuscular re-education, 97140- Manual therapy, [id number]- Aquatic Therapy, 97014- Electrical stimulation (unattended), [id number]- Electrical stimulation (manual), 16109- Traction (mechanical), Taping, Dry Needling, Joint mobilization, Joint manipulation, Spinal manipulation, Spinal mobilization, Cryotherapy, and Moist heat  PLAN FOR NEXT SESSION:  Discharge from PT.    Ellin Goodie PT, DPT  [facility] [address], [place], [address] Phone: [phone]   Fax:  [phone]

## 2023-09-08 DIAGNOSIS — H401133 Primary open-angle glaucoma, bilateral, severe stage: Secondary | ICD-10-CM | POA: Diagnosis not present

## 2023-09-15 DIAGNOSIS — R0602 Shortness of breath: Secondary | ICD-10-CM | POA: Diagnosis not present

## 2023-09-15 DIAGNOSIS — R058 Other specified cough: Secondary | ICD-10-CM | POA: Diagnosis not present

## 2023-10-20 DIAGNOSIS — M150 Primary generalized (osteo)arthritis: Secondary | ICD-10-CM | POA: Diagnosis not present

## 2023-10-20 DIAGNOSIS — M109 Gout, unspecified: Secondary | ICD-10-CM | POA: Diagnosis not present

## 2023-10-20 DIAGNOSIS — Z79899 Other long term (current) drug therapy: Secondary | ICD-10-CM | POA: Diagnosis not present

## 2023-11-25 DIAGNOSIS — L820 Inflamed seborrheic keratosis: Secondary | ICD-10-CM | POA: Diagnosis not present

## 2023-11-25 DIAGNOSIS — L570 Actinic keratosis: Secondary | ICD-10-CM | POA: Diagnosis not present

## 2023-11-25 DIAGNOSIS — L821 Other seborrheic keratosis: Secondary | ICD-10-CM | POA: Diagnosis not present

## 2023-11-25 DIAGNOSIS — L739 Follicular disorder, unspecified: Secondary | ICD-10-CM | POA: Diagnosis not present

## 2023-11-25 DIAGNOSIS — L814 Other melanin hyperpigmentation: Secondary | ICD-10-CM | POA: Diagnosis not present

## 2023-12-13 DIAGNOSIS — N1831 Chronic kidney disease, stage 3a: Secondary | ICD-10-CM | POA: Diagnosis not present

## 2023-12-13 DIAGNOSIS — R011 Cardiac murmur, unspecified: Secondary | ICD-10-CM | POA: Diagnosis not present

## 2023-12-13 DIAGNOSIS — G8929 Other chronic pain: Secondary | ICD-10-CM | POA: Diagnosis not present

## 2023-12-13 DIAGNOSIS — M25511 Pain in right shoulder: Secondary | ICD-10-CM | POA: Diagnosis not present

## 2023-12-13 DIAGNOSIS — M109 Gout, unspecified: Secondary | ICD-10-CM | POA: Diagnosis not present

## 2023-12-13 DIAGNOSIS — M25512 Pain in left shoulder: Secondary | ICD-10-CM | POA: Diagnosis not present

## 2023-12-13 DIAGNOSIS — R0609 Other forms of dyspnea: Secondary | ICD-10-CM | POA: Diagnosis not present

## 2023-12-13 DIAGNOSIS — E039 Hypothyroidism, unspecified: Secondary | ICD-10-CM | POA: Diagnosis not present

## 2023-12-13 DIAGNOSIS — R300 Dysuria: Secondary | ICD-10-CM | POA: Diagnosis not present

## 2023-12-13 DIAGNOSIS — I10 Essential (primary) hypertension: Secondary | ICD-10-CM | POA: Diagnosis not present

## 2023-12-16 DIAGNOSIS — R011 Cardiac murmur, unspecified: Secondary | ICD-10-CM | POA: Diagnosis not present

## 2023-12-16 DIAGNOSIS — R0609 Other forms of dyspnea: Secondary | ICD-10-CM | POA: Diagnosis not present

## 2024-01-26 ENCOUNTER — Other Ambulatory Visit: Payer: Self-pay | Admitting: Urology

## 2024-02-24 NOTE — Progress Notes (Signed)
 02/25/2024 5:55 PM   Jacob CHRISTELLA Pride Sr. June 19, 1933 [id number]  Referring provider: Epifanio Alm SQUIBB, MD [address] [place],  [place] [address]  Urological history 1.  Elevated PSA - PSA (2020) 9.3  - prostate MRI (12/2018) negative   2. BPH with LU TS - PSA (2024) 2.1 - prostate volume on prostate MRI (2020) 57. 47 cc -Doxazosin  4 mg daily finasteride  5 mg daily  Chief Complaint  Patient presents with   Follow-up   Elevated PSA   HPI: Jacob EINO WHITNER Sr. is a 89 y.o. man who presents today for follow up.   Previous records reviewed.     I PSS 20/2  He has had an increase in his urinary frequency, urgency, a weak urinary stream and post void dribbling since he has been without his finasteride  the last month.    He also has been having burning with urination on and off for four months.  He took his wife's cephalexin and his symptoms improved.  When he ran out of the antibiotic, the symptoms returned.     Patient denies any modifying or aggravating factors.  Patient denies any recent UTI's, gross hematuria,  or suprapubic/flank pain.  Patient denies any fevers, chills, nausea or vomiting.    UA (11/2023) bland  Today's UA bland   Serum creatinine (11/2023) 1.5, eGFR 45  PMH: Past Medical History:  Diagnosis Date   Arthritis    right hand   BPH (benign prostatic hyperplasia)    CKD (chronic kidney disease), stage III (HCC)    COVID-19 05/2020   ED (erectile dysfunction)    Elevated PSA    GERD (gastroesophageal reflux disease)    occasional   Glaucoma    Gout    Gout    History of alcoholism (HCC)    History of chicken pox    History of measles    History of mumps    History of pernicious anemia    HTN (hypertension)    controlled   Hypothyroidism    Wears partial dentures    upper and lower     Surgical History: Past Surgical History:  Procedure Laterality Date   CATARACT EXTRACTION W/PHACO Right 10/08/2020   Procedure: CATARACT EXTRACTION  PHACO AND INTRAOCULAR LENS PLACEMENT (IOC) RIGHT OMIDRIA ;  Surgeon: Jaye Fallow, MD;  Location: [facility];  Service: Ophthalmology;  Laterality: Right;  CDE 27.87 02:08.6 minutes   CATARACT EXTRACTION W/PHACO Left 10/22/2020   Procedure: CATARACT EXTRACTION PHACO AND INTRAOCULAR LENS PLACEMENT (IOC) LEFT;  Surgeon: Jaye Fallow, MD;  Location: [facility];  Service: Ophthalmology;  Laterality: Left;  7.97 00:50.1   EYE SURGERY Right 10/24/2019   laser eye surgery on right eye for glaucoma   PARATHYROIDECTOMY  2004   PHOTOCOAGULATION WITH LASER Right 10/07/2015   Procedure: PHOTOCOAGULATION WITH LASER RIGHT EYE TRANS SCLERAL;  Surgeon: Donzell Arlyce Budd, MD;  Location: [facility];  Service: Ophthalmology;  Laterality: Right;   TONSILLECTOMY      Home Medications:  Allergies as of 02/25/2024       Reactions   Krystexxa  [pegloticase] Anaphylaxis   Allopurinol     Abdominal pain and itching   Cephalosporins    Ciprofloxacin    causes mouth to itch   Levofloxacin Other (See Comments)   Tendon pain   Mycophenolate Mofetil    Niacin    Other reaction(s): UNKNOWN   Shellfish Allergy     agrivates gout   Sulfa Antibiotics Itching  Uloric  [febuxostat]    Other reaction(s): Abdominal pain   Cephalexin Diarrhea   Finasteride  Itching   He can tolerate the medication now and he is currently taking the medication         Medication List        Accurate as of February 25, 2024 11:59 PM. If you have any questions, ask your nurse or doctor.          allopurinol  100 MG tablet Commonly known as: ZYLOPRIM  Take 1 tablet by mouth 2 (two) times daily.   brimonidine  0.2 % ophthalmic solution Commonly known as: ALPHAGAN  Place 1 drop into both eyes 2 (two) times daily.   colchicine  0.6 MG tablet TAKE 1 TABLET BY MOUTH EVERY DAY AS NEEDED FOR GOUT   cyanocobalamin  1000 MCG/ML injection Commonly known as: VITAMIN B12 INJECT 1 ML (1,000 MCG  TOTAL) INTO THE MUSCLE EVERY 30 (THIRTY) DAYS.   dorzolamide-timolol  2-0.5 % ophthalmic solution Commonly known as: COSOPT Place 1 drop into both eyes 2 (two) times daily.   doxazosin  4 MG tablet Commonly known as: CARDURA  Take 4 mg by mouth at bedtime.   ferrous sulfate 325 (65 FE) MG tablet Take 1 tablet by mouth daily with breakfast.   finasteride  5 MG tablet Commonly known as: PROSCAR  Take 1 tablet (5 mg total) by mouth daily.   hydrOXYzine  10 MG tablet Commonly known as: ATARAX  Take 10 mg by mouth 3 (three) times daily as needed for itching.   irbesartan  300 MG tablet Commonly known as: AVAPRO  Take 300 mg by mouth daily.   latanoprost 0.005 % ophthalmic solution Commonly known as: XALATAN Place 1 drop into both eyes at bedtime.   levothyroxine  125 MCG tablet Commonly known as: SYNTHROID  Take by mouth.   meloxicam  7.5 MG tablet Commonly known as: MOBIC  TAKE 1 TABLET BY MOUTH DAILY AS NEEDED.   multivitamin tablet Take 1 tablet by mouth daily.   Needles & Syringes Misc Use one each month for vitamin b12 injection   nitrofurantoin  (macrocrystal-monohydrate) 100 MG capsule Commonly known as: MACROBID  Take 1 capsule (100 mg total) by mouth every 12 (twelve) hours.   sildenafil  20 MG tablet Commonly known as: REVATIO  Take 3 to 5 tablets two hours before intercouse on an empty stomach.  Do not take with nitrates.   sildenafil  50 MG tablet Commonly known as: VIAGRA  Take by mouth.   vitamin C 100 MG tablet Take by mouth daily. Dose unsure   Vitamin D3 10 MCG (400 UNIT) tablet Take by mouth daily. Unsure dose        Allergies:  Allergies  Allergen Reactions   Krystexxa  [Pegloticase] Anaphylaxis   Allopurinol      Abdominal pain and itching   Cephalosporins    Ciprofloxacin     causes mouth to itch   Levofloxacin Other (See Comments)    Tendon pain   Mycophenolate Mofetil    Niacin     Other reaction(s): UNKNOWN   Shellfish Allergy       agrivates gout   Sulfa Antibiotics Itching   Uloric  [Febuxostat]     Other reaction(s): Abdominal pain   Cephalexin Diarrhea   Finasteride  Itching    He can tolerate the medication now and he is currently taking the medication     Family History: Family History  Problem Relation Age of Onset   Asthma Mother    Heart attack Father    Parkinson's disease Father    Kidney disease Neg Hx  Prostate cancer Neg Hx        maybe paternal grandfather ? unsure    Social History:  reports that he quit smoking about 45 years ago. His smoking use included cigarettes. He started smoking about 65 years ago. He has a 30 pack-year smoking history. He has been exposed to tobacco smoke. He has never used smokeless tobacco. He reports that he does not drink alcohol and does not use drugs.  ROS: For pertinent review of systems please refer to history of present illness  Physical Exam: BP (!) 144/89   Pulse 73   Ht 6' (1.829 m)   Wt 180 lb (81.6 kg)   BMI 24.41 kg/m   Constitutional:  Well nourished. Alert and oriented, No acute distress. HEENT: [place] AT, moist mucus membranes.  Trachea midline Cardiovascular: No clubbing, cyanosis, or edema. Respiratory: Normal respiratory effort, no increased work of breathing. Neurologic: Grossly intact, no focal deficits, moving all 4 extremities. Psychiatric: Normal mood and affect.   Laboratory Data:  See HPI and EPIC I have reviewed the labs.  Assessment & Plan:    1. Dysuria - UA bland - urine sent for culture - started on Macrobid  empirically - will have him return in three months for symptom recheck, if culture is negative and the antibiotics do not improve his symptoms, we will need to consider cystoscopy for further evaluation  2. BPH with LUTS -restart finasteride  5 mg daily  - continue doxazosin  4 mg  3. Erectile dysfunction:  - not sexually active at this time  Return in about 3 months (around 05/26/2024) for I PSS/PVR .  CLOTILDA HELON RIGGERS  [facility] [address] [place], [place] [address] [phone]

## 2024-02-25 ENCOUNTER — Ambulatory Visit (INDEPENDENT_AMBULATORY_CARE_PROVIDER_SITE_OTHER): Admitting: Urology

## 2024-02-25 VITALS — BP 144/89 | HR 73 | Ht 72.0 in | Wt 180.0 lb

## 2024-02-25 DIAGNOSIS — N138 Other obstructive and reflux uropathy: Secondary | ICD-10-CM | POA: Diagnosis not present

## 2024-02-25 DIAGNOSIS — N401 Enlarged prostate with lower urinary tract symptoms: Secondary | ICD-10-CM | POA: Diagnosis not present

## 2024-02-25 DIAGNOSIS — R300 Dysuria: Secondary | ICD-10-CM

## 2024-02-25 LAB — MICROSCOPIC EXAMINATION: Bacteria, UA: NONE SEEN

## 2024-02-25 LAB — URINALYSIS, COMPLETE
Bilirubin, UA: NEGATIVE
Glucose, UA: NEGATIVE
Ketones, UA: NEGATIVE
Leukocytes,UA: NEGATIVE
Nitrite, UA: NEGATIVE
Protein,UA: NEGATIVE
RBC, UA: NEGATIVE
Specific Gravity, UA: 1.015 (ref 1.005–1.030)
Urobilinogen, Ur: 0.2 mg/dL (ref 0.2–1.0)
pH, UA: 6 (ref 5.0–7.5)

## 2024-02-25 MED ORDER — NITROFURANTOIN MONOHYD MACRO 100 MG PO CAPS: 100.0000 mg | ORAL_CAPSULE | Freq: Two times a day (BID) | ORAL | 0 refills | Status: AC

## 2024-02-25 MED ORDER — FINASTERIDE 5 MG PO TABS: 5.0000 mg | ORAL_TABLET | Freq: Every day | ORAL | 3 refills | Status: AC

## 2024-02-27 ENCOUNTER — Encounter: Payer: Self-pay | Admitting: Urology

## 2024-02-29 ENCOUNTER — Ambulatory Visit: Payer: Self-pay | Admitting: Urology

## 2024-02-29 LAB — CULTURE, URINE COMPREHENSIVE

## 2024-03-01 NOTE — Progress Notes (Signed)
 Patient called back and left a message. So I Called the patient back and patient didn't answer left a message for patient to call back

## 2024-03-01 NOTE — Progress Notes (Signed)
 Called patient and patient didn't answer left a message for patient to call back

## 2024-03-02 NOTE — Progress Notes (Signed)
 Called patient and patient understood and states he is feeling better already.

## 2024-03-07 DIAGNOSIS — Z23 Encounter for immunization: Secondary | ICD-10-CM | POA: Diagnosis not present

## 2024-03-08 DIAGNOSIS — H35379 Puckering of macula, unspecified eye: Secondary | ICD-10-CM | POA: Diagnosis not present

## 2024-03-08 DIAGNOSIS — H26491 Other secondary cataract, right eye: Secondary | ICD-10-CM | POA: Diagnosis not present

## 2024-03-08 DIAGNOSIS — H52223 Regular astigmatism, bilateral: Secondary | ICD-10-CM | POA: Diagnosis not present

## 2024-03-08 DIAGNOSIS — H401122 Primary open-angle glaucoma, left eye, moderate stage: Secondary | ICD-10-CM | POA: Diagnosis not present

## 2024-03-08 DIAGNOSIS — H401133 Primary open-angle glaucoma, bilateral, severe stage: Secondary | ICD-10-CM | POA: Diagnosis not present

## 2024-03-14 DIAGNOSIS — M1990 Unspecified osteoarthritis, unspecified site: Secondary | ICD-10-CM | POA: Diagnosis not present

## 2024-03-14 DIAGNOSIS — M25541 Pain in joints of right hand: Secondary | ICD-10-CM | POA: Diagnosis not present

## 2024-04-24 DIAGNOSIS — M150 Primary generalized (osteo)arthritis: Secondary | ICD-10-CM | POA: Diagnosis not present

## 2024-04-24 DIAGNOSIS — N184 Chronic kidney disease, stage 4 (severe): Secondary | ICD-10-CM | POA: Diagnosis not present

## 2024-04-24 DIAGNOSIS — M109 Gout, unspecified: Secondary | ICD-10-CM | POA: Diagnosis not present

## 2024-04-24 DIAGNOSIS — Z79899 Other long term (current) drug therapy: Secondary | ICD-10-CM | POA: Diagnosis not present

## 2024-05-01 DIAGNOSIS — L820 Inflamed seborrheic keratosis: Secondary | ICD-10-CM | POA: Diagnosis not present

## 2024-05-01 DIAGNOSIS — L570 Actinic keratosis: Secondary | ICD-10-CM | POA: Diagnosis not present

## 2024-05-01 DIAGNOSIS — L538 Other specified erythematous conditions: Secondary | ICD-10-CM | POA: Diagnosis not present

## 2024-05-01 DIAGNOSIS — L408 Other psoriasis: Secondary | ICD-10-CM | POA: Diagnosis not present

## 2024-05-01 NOTE — Progress Notes (Unsigned)
 05/03/2024 8:53 PM   Jacob CHRISTELLA Pride Sr. 31-Oct-1933 [id number]  Referring provider: Epifanio Alm SQUIBB, MD [address] [place],  [place] [address]  Urological history 1.  Elevated PSA - PSA (2020) 9.3  - prostate MRI (12/2018) negative   2. BPH with LU TS - PSA (2024) 2.1 - prostate volume on prostate MRI (2020) 57. 47 cc -Doxazosin  4 mg daily finasteride  5 mg daily  No chief complaint on file.  HPI: Jacob LOPER Sr. is a 89 y.o. man who presents today for 3 month follow up.   Previous records reviewed.     I PSS ***  He reports sensation of incomplete bladder emptying,   urinary frequency,   urinary intermittency,   urinary urgency,   a weak urinary stream,   having to strain to void,   nocturia x ***,   leaking before being able to reach the restroom,   leaking with coughing,   leaking without awareness,   and post void dribbling.     He is wearing *** pads//depends  daily.    UA***  PVR ***  PSA  (07/2022) 2.1  Serum creatinine (04/2024) 1.8, eGFR 36  BPH meds: finasteride  5 mg daily and doxazosin  4 mg daily   PMH: Past Medical History:  Diagnosis Date   Arthritis    right hand   BPH (benign prostatic hyperplasia)    CKD (chronic kidney disease), stage III (HCC)    COVID-19 05/2020   ED (erectile dysfunction)    Elevated PSA    GERD (gastroesophageal reflux disease)    occasional   Glaucoma    Gout    Gout    History of alcoholism (HCC)    History of chicken pox    History of measles    History of mumps    History of pernicious anemia    HTN (hypertension)    controlled   Hypothyroidism    Wears partial dentures    upper and lower     Surgical History: Past Surgical History:  Procedure Laterality Date   CATARACT EXTRACTION W/PHACO Right 10/08/2020   Procedure: CATARACT EXTRACTION PHACO AND INTRAOCULAR LENS PLACEMENT (IOC) RIGHT OMIDRIA ;  Surgeon: Jaye Fallow, MD;  [facility];  Service:  Ophthalmology;  Laterality: Right;  CDE 27.87 02:08.6 minutes   CATARACT EXTRACTION W/PHACO Left 10/22/2020   Procedure: CATARACT EXTRACTION PHACO AND INTRAOCULAR LENS PLACEMENT (IOC) LEFT;  Surgeon: Jaye Fallow, MD;  Location: [facility];  Service: Ophthalmology;  Laterality: Left;  7.97 00:50.1   EYE SURGERY Right 10/24/2019   laser eye surgery on right eye for glaucoma   PARATHYROIDECTOMY  2004   PHOTOCOAGULATION WITH LASER Right 10/07/2015   Procedure: PHOTOCOAGULATION WITH LASER RIGHT EYE TRANS SCLERAL;  Surgeon: Donzell Arlyce Budd, MD;  Location: [facility];  Service: Ophthalmology;  Laterality: Right;   TONSILLECTOMY      Home Medications:  Allergies as of 05/03/2024       Reactions   Krystexxa  [pegloticase] Anaphylaxis   Allopurinol     Abdominal pain and itching   Cephalosporins    Ciprofloxacin    causes mouth to itch   Levofloxacin Other (See Comments)   Tendon pain   Mycophenolate Mofetil    Niacin    Other reaction(s): UNKNOWN   Shellfish Allergy     agrivates gout   Sulfa Antibiotics Itching   Uloric  [febuxostat]    Other reaction(s): Abdominal pain   Cephalexin Diarrhea  Finasteride  Itching   He can tolerate the medication now and he is currently taking the medication         Medication List        Accurate as of May 01, 2024  8:53 PM. If you have any questions, ask your nurse or doctor.          allopurinol  100 MG tablet Commonly known as: ZYLOPRIM  Take 1 tablet by mouth 2 (two) times daily.   brimonidine  0.2 % ophthalmic solution Commonly known as: ALPHAGAN  Place 1 drop into both eyes 2 (two) times daily.   colchicine  0.6 MG tablet TAKE 1 TABLET BY MOUTH EVERY DAY AS NEEDED FOR GOUT   cyanocobalamin  1000 MCG/ML injection Commonly known as: VITAMIN B12 INJECT 1 ML (1,000 MCG TOTAL) INTO THE MUSCLE EVERY 30 (THIRTY) DAYS.   dorzolamide-timolol  2-0.5 % ophthalmic solution Commonly known as: COSOPT Place 1  drop into both eyes 2 (two) times daily.   doxazosin  4 MG tablet Commonly known as: CARDURA  Take 4 mg by mouth at bedtime.   ferrous sulfate 325 (65 FE) MG tablet Take 1 tablet by mouth daily with breakfast.   finasteride  5 MG tablet Commonly known as: PROSCAR  Take 1 tablet (5 mg total) by mouth daily.   hydrOXYzine  10 MG tablet Commonly known as: ATARAX  Take 10 mg by mouth 3 (three) times daily as needed for itching.   irbesartan  300 MG tablet Commonly known as: AVAPRO  Take 300 mg by mouth daily.   latanoprost 0.005 % ophthalmic solution Commonly known as: XALATAN Place 1 drop into both eyes at bedtime.   levothyroxine  125 MCG tablet Commonly known as: SYNTHROID  Take by mouth.   meloxicam  7.5 MG tablet Commonly known as: MOBIC  TAKE 1 TABLET BY MOUTH DAILY AS NEEDED.   multivitamin tablet Take 1 tablet by mouth daily.   Needles & Syringes Misc Use one each month for vitamin b12 injection   nitrofurantoin  (macrocrystal-monohydrate) 100 MG capsule Commonly known as: MACROBID  Take 1 capsule (100 mg total) by mouth every 12 (twelve) hours.   sildenafil  20 MG tablet Commonly known as: REVATIO  Take 3 to 5 tablets two hours before intercouse on an empty stomach.  Do not take with nitrates.   sildenafil  50 MG tablet Commonly known as: VIAGRA  Take by mouth.   vitamin C 100 MG tablet Take by mouth daily. Dose unsure   Vitamin D3 10 MCG (400 UNIT) tablet Take by mouth daily. Unsure dose        Allergies:  Allergies  Allergen Reactions   Krystexxa  [Pegloticase] Anaphylaxis   Allopurinol      Abdominal pain and itching   Cephalosporins    Ciprofloxacin     causes mouth to itch   Levofloxacin Other (See Comments)    Tendon pain   Mycophenolate Mofetil    Niacin     Other reaction(s): UNKNOWN   Shellfish Allergy      agrivates gout   Sulfa Antibiotics Itching   Uloric  [Febuxostat]     Other reaction(s): Abdominal pain   Cephalexin Diarrhea    Finasteride  Itching    He can tolerate the medication now and he is currently taking the medication     Family History: Family History  Problem Relation Age of Onset   Asthma Mother    Heart attack Father    Parkinson's disease Father    Kidney disease Neg Hx    Prostate cancer Neg Hx        maybe paternal grandfather ?  unsure    Social History:  reports that he quit smoking about 45 years ago. His smoking use included cigarettes. He started smoking about 65 years ago. He has a 30 pack-year smoking history. He has been exposed to tobacco smoke. He has never used smokeless tobacco. He reports that he does not drink alcohol and does not use drugs.  ROS: For pertinent review of systems please refer to history of present illness  Physical Exam: There were no vitals taken for this visit.  Constitutional:  Well nourished. Alert and oriented, No acute distress. HEENT: [place] AT, moist mucus membranes.  Trachea midline, no masses. Cardiovascular: No clubbing, cyanosis, or edema. Respiratory: Normal respiratory effort, no increased work of breathing. GI: Abdomen is soft, non tender, non distended, no abdominal masses. Liver and spleen not palpable.  No hernias appreciated.  Stool sample for occult testing is not indicated.   GU: No CVA tenderness.  No bladder fullness or masses.  Patient with circumcised/uncircumcised phallus. ***Foreskin easily retracted***  Urethral meatus is patent.  No penile discharge. No penile lesions or rashes. Scrotum without lesions, cysts, rashes and/or edema.  Testicles are located scrotally bilaterally. No masses are appreciated in the testicles. Left and right epididymis are normal. Rectal: Patient with  normal sphincter tone. Anus and perineum without scarring or rashes. No rectal masses are appreciated. Prostate is approximately *** grams, *** nodules are appreciated. Seminal vesicles are normal. Skin: No rashes, bruises or suspicious lesions. Lymph: No cervical or  inguinal adenopathy. Neurologic: Grossly intact, no focal deficits, moving all 4 extremities. Psychiatric: Normal mood and affect.   Laboratory Data:  See HPI and EPIC I have reviewed the labs.  Assessment & Plan:    1. Dysuria - ***  2. BPH with LUTS -restart finasteride  5 mg daily  - continue doxazosin  4 mg  3. Erectile dysfunction:  - not sexually active at this time  No follow-ups on file.  Jacob Nielsen  [facility] [address] [place], [place] [address] [phone]

## 2024-05-03 ENCOUNTER — Encounter: Payer: Self-pay | Admitting: Urology

## 2024-05-03 ENCOUNTER — Ambulatory Visit: Admitting: Urology

## 2024-05-03 VITALS — BP 157/90 | HR 75 | Ht 73.0 in | Wt 180.9 lb

## 2024-05-03 DIAGNOSIS — N529 Male erectile dysfunction, unspecified: Secondary | ICD-10-CM | POA: Diagnosis not present

## 2024-05-03 DIAGNOSIS — N184 Chronic kidney disease, stage 4 (severe): Secondary | ICD-10-CM | POA: Diagnosis not present

## 2024-05-03 DIAGNOSIS — N401 Enlarged prostate with lower urinary tract symptoms: Secondary | ICD-10-CM | POA: Diagnosis not present

## 2024-05-03 DIAGNOSIS — R300 Dysuria: Secondary | ICD-10-CM

## 2024-05-03 DIAGNOSIS — N138 Other obstructive and reflux uropathy: Secondary | ICD-10-CM

## 2024-05-03 LAB — MICROSCOPIC EXAMINATION

## 2024-05-03 LAB — URINALYSIS, COMPLETE
Bilirubin, UA: NEGATIVE
Glucose, UA: NEGATIVE
Ketones, UA: NEGATIVE
Leukocytes,UA: NEGATIVE
Nitrite, UA: NEGATIVE
Protein,UA: NEGATIVE
RBC, UA: NEGATIVE
Specific Gravity, UA: 1.025 (ref 1.005–1.030)
Urobilinogen, Ur: 0.2 mg/dL (ref 0.2–1.0)
pH, UA: 6 (ref 5.0–7.5)

## 2024-05-03 LAB — BLADDER SCAN AMB NON-IMAGING

## 2024-05-19 ENCOUNTER — Ambulatory Visit: Admitting: Urology

## 2024-07-07 ENCOUNTER — Other Ambulatory Visit: Payer: Self-pay | Admitting: Nephrology

## 2024-07-07 DIAGNOSIS — N1832 Chronic kidney disease, stage 3b: Secondary | ICD-10-CM

## 2024-07-07 DIAGNOSIS — R8290 Unspecified abnormal findings in urine: Secondary | ICD-10-CM

## 2024-07-12 ENCOUNTER — Ambulatory Visit

## 2025-05-03 ENCOUNTER — Ambulatory Visit: Admitting: Urology
